# Patient Record
Sex: Male | Born: 1935 | Race: White | Hispanic: No | Marital: Married | Smoking: Former smoker
Health system: Southern US, Community
[De-identification: ages and names within clinical notes are randomized; demographics above are authoritative.]

## PROBLEM LIST (undated history)

## (undated) DIAGNOSIS — I82409 Acute embolism and thrombosis of unspecified deep veins of unspecified lower extremity: Secondary | ICD-10-CM

## (undated) DIAGNOSIS — H269 Unspecified cataract: Secondary | ICD-10-CM

## (undated) DIAGNOSIS — E785 Hyperlipidemia, unspecified: Secondary | ICD-10-CM

## (undated) DIAGNOSIS — M25862 Other specified joint disorders, left knee: Secondary | ICD-10-CM

## (undated) DIAGNOSIS — M1990 Unspecified osteoarthritis, unspecified site: Secondary | ICD-10-CM

## (undated) HISTORY — DX: Hyperlipidemia, unspecified: E78.5

## (undated) HISTORY — PX: APPENDECTOMY: SHX54

## (undated) HISTORY — PX: MOLE REMOVAL: SHX2046

## (undated) HISTORY — PX: TONSILLECTOMY AND ADENOIDECTOMY: SUR1326

## (undated) HISTORY — DX: Unspecified cataract: H26.9

## (undated) HISTORY — PX: TUBAL LIGATION: SHX77

## (undated) HISTORY — PX: JOINT REPLACEMENT: SHX530

## (undated) HISTORY — DX: Acute embolism and thrombosis of unspecified deep veins of unspecified lower extremity: I82.409

## (undated) HISTORY — PX: EYE SURGERY: SHX253

## (undated) HISTORY — PX: TOTAL HIP ARTHROPLASTY: SHX124

## (undated) HISTORY — DX: Unspecified osteoarthritis, unspecified site: M19.90

---

## 2006-01-28 ENCOUNTER — Encounter: Payer: Self-pay | Admitting: Internal Medicine

## 2006-11-22 DIAGNOSIS — M1610 Unilateral primary osteoarthritis, unspecified hip: Secondary | ICD-10-CM | POA: Insufficient documentation

## 2007-07-05 DIAGNOSIS — E7800 Pure hypercholesterolemia, unspecified: Secondary | ICD-10-CM | POA: Insufficient documentation

## 2008-03-05 DIAGNOSIS — H6990 Unspecified Eustachian tube disorder, unspecified ear: Secondary | ICD-10-CM | POA: Insufficient documentation

## 2008-03-05 DIAGNOSIS — H6980 Other specified disorders of Eustachian tube, unspecified ear: Secondary | ICD-10-CM | POA: Insufficient documentation

## 2009-04-01 DIAGNOSIS — H019 Unspecified inflammation of eyelid: Secondary | ICD-10-CM | POA: Insufficient documentation

## 2009-10-28 ENCOUNTER — Ambulatory Visit: Payer: Self-pay | Admitting: Family Medicine

## 2009-10-28 DIAGNOSIS — M545 Low back pain, unspecified: Secondary | ICD-10-CM | POA: Insufficient documentation

## 2009-11-12 DIAGNOSIS — R5383 Other fatigue: Secondary | ICD-10-CM | POA: Insufficient documentation

## 2009-11-12 DIAGNOSIS — R5381 Other malaise: Secondary | ICD-10-CM | POA: Insufficient documentation

## 2009-12-03 ENCOUNTER — Ambulatory Visit: Payer: Self-pay | Admitting: Family Medicine

## 2011-08-17 ENCOUNTER — Ambulatory Visit: Payer: Self-pay | Admitting: Family Medicine

## 2014-01-23 LAB — LIPID PANEL
Cholesterol: 156 mg/dL (ref 0–200)
HDL: 55 mg/dL (ref 35–70)
LDL Cholesterol: 77 mg/dL
LDl/HDL Ratio: 1.4
Triglycerides: 118 mg/dL (ref 40–160)

## 2014-01-23 LAB — HEPATIC FUNCTION PANEL
ALT: 23 U/L (ref 10–40)
AST: 21 U/L (ref 14–40)
Alkaline Phosphatase: 67 U/L (ref 25–125)
Bilirubin, Total: 0.7 mg/dL

## 2014-01-23 LAB — BASIC METABOLIC PANEL
BUN: 19 mg/dL (ref 4–21)
Creatinine: 1.4 mg/dL — AB (ref 0.6–1.3)
Glucose: 104 mg/dL
Potassium: 4.7 mmol/L (ref 3.4–5.3)
Sodium: 141 mmol/L (ref 137–147)

## 2014-01-23 LAB — CBC AND DIFFERENTIAL
HCT: 44 % (ref 41–53)
Hemoglobin: 16 g/dL (ref 13.5–17.5)
Neutrophils Absolute: 6 /uL
Platelets: 229 10*3/uL (ref 150–399)
WBC: 8.8 10^3/mL

## 2014-01-23 LAB — TSH: TSH: 1.95 u[IU]/mL (ref 0.41–5.90)

## 2014-01-23 LAB — PSA: PSA: 5.8

## 2014-04-15 ENCOUNTER — Ambulatory Visit: Payer: Self-pay | Admitting: Family Medicine

## 2014-07-16 ENCOUNTER — Ambulatory Visit: Payer: Self-pay | Admitting: Family Medicine

## 2015-07-28 ENCOUNTER — Ambulatory Visit (INDEPENDENT_AMBULATORY_CARE_PROVIDER_SITE_OTHER): Payer: PPO | Admitting: Family Medicine

## 2015-07-28 ENCOUNTER — Encounter: Payer: Self-pay | Admitting: Family Medicine

## 2015-07-28 VITALS — BP 100/60 | HR 78 | Temp 99.0°F | Resp 16 | Wt 174.4 lb

## 2015-07-28 DIAGNOSIS — E785 Hyperlipidemia, unspecified: Secondary | ICD-10-CM | POA: Insufficient documentation

## 2015-07-28 DIAGNOSIS — E782 Mixed hyperlipidemia: Secondary | ICD-10-CM | POA: Insufficient documentation

## 2015-07-28 DIAGNOSIS — J069 Acute upper respiratory infection, unspecified: Secondary | ICD-10-CM | POA: Diagnosis not present

## 2015-07-28 NOTE — Patient Instructions (Addendum)
Discussed use of Mucinex D and Delsym. Try Claritin for sinus drip.

## 2015-07-28 NOTE — Progress Notes (Signed)
Subjective:     Patient ID: Matthew Clayton, male   DOB: 1926/11/12, 82 y.o.   MRN: [id number]  HPI  Chief Complaint  Patient presents with  . Cough    Patient comes in office today with concerns of dry cough for the past 12hrs. Patient denies shortness of breath or wheezing but states that he has had runny nose associated. Patient has not taken anything over the counter to relieve symptoms.   Wife has been sick as well. No flu shot this season. Denies body aches or fever. Does reports chronic phlegm production.   Review of Systems     Objective:   Physical Exam  Constitutional: He appears well-developed and well-nourished. No distress.  Ears: T.M's intact without inflammation Throat: tonsils absent, minimal erythema Neck: no cervical adenopathy Lungs: clear    Assessment:    1. Upper respiratory infection    Plan:    Discussed otc treatment

## 2015-08-04 ENCOUNTER — Ambulatory Visit (INDEPENDENT_AMBULATORY_CARE_PROVIDER_SITE_OTHER): Payer: PPO | Admitting: Family Medicine

## 2015-08-04 ENCOUNTER — Encounter: Payer: Self-pay | Admitting: Family Medicine

## 2015-08-04 VITALS — BP 112/62 | HR 62 | Temp 98.3°F | Resp 16

## 2015-08-04 DIAGNOSIS — K219 Gastro-esophageal reflux disease without esophagitis: Secondary | ICD-10-CM | POA: Insufficient documentation

## 2015-08-04 DIAGNOSIS — D649 Anemia, unspecified: Secondary | ICD-10-CM | POA: Insufficient documentation

## 2015-08-04 DIAGNOSIS — R059 Cough, unspecified: Secondary | ICD-10-CM

## 2015-08-04 DIAGNOSIS — J410 Simple chronic bronchitis: Secondary | ICD-10-CM

## 2015-08-04 DIAGNOSIS — R05 Cough: Secondary | ICD-10-CM | POA: Diagnosis not present

## 2015-08-04 DIAGNOSIS — J3089 Other allergic rhinitis: Secondary | ICD-10-CM

## 2015-08-04 DIAGNOSIS — I10 Essential (primary) hypertension: Secondary | ICD-10-CM | POA: Insufficient documentation

## 2015-08-04 DIAGNOSIS — J309 Allergic rhinitis, unspecified: Secondary | ICD-10-CM | POA: Insufficient documentation

## 2015-08-04 MED ORDER — DOXYCYCLINE HYCLATE 100 MG PO TABS: 100.0000 mg | ORAL_TABLET | Freq: Two times a day (BID) | ORAL | Status: DC

## 2015-08-04 NOTE — Progress Notes (Signed)
Patient ID: Matthew Clayton, male   DOB: [birth year], 82 y.o.   MRN: [id number]    Subjective:  HPI  Patient saw [name] last week March 13th and was diagnosed with upper respiratory infection and was advised to take Robitussin and Mucinex which patient has done. Patient is still having a cough mainly dry, some runny nose. No other symptoms. Cough does not keep him up at night. Cough is constant even when he is trying to talk. He never had to use an inhaler or take allergy medications. Cough mildly productive. No fever or chills or myalgias. No shortness of breath. Prior to Admission medications   Medication Sig Start Date End Date Taking? Authorizing Provider  simvastatin (ZOCOR) 40 MG tablet  07/23/15  Yes Historical Provider, MD    Patient Active Problem List   Diagnosis Date Noted  . Anemia 08/04/2015  . Essential hypertension 08/04/2015  . Allergic rhinitis 08/04/2015  . Acid reflux 08/04/2015  . Hyperlipidemia 07/28/2015    Past Medical History  Diagnosis Date  . Hyperlipidemia     Social History   Social History  . Marital Status: Married    Spouse Name: N/A  . Number of Children: N/A  . Years of Education: N/A   Occupational History  . Not on file.   Social History Main Topics  . Smoking status: Former Smoker -- 1.50 packs/day for 20 years    Types: Cigarettes    Quit date: 05/17/1958  . Smokeless tobacco: Never Used     Comment: quit in 1968  . Alcohol Use: No  . Drug Use: No  . Sexual Activity: Not on file   Other Topics Concern  . Not on file   Social History Narrative    No Known Allergies  Review of Systems  Constitutional: Negative.   HENT: Negative.   Eyes: Negative.   Respiratory: Positive for cough.   Cardiovascular: Negative.   Gastrointestinal: Negative.   Musculoskeletal: Negative.   Skin: Negative.   Psychiatric/Behavioral: Negative.     Immunization History  Administered Date(s) Administered  . Td 09/15/2012  . Zoster 05/23/2009    Objective:  BP 112/62 mmHg  Pulse 62  Temp(Src) 98.3 F (36.8 C)  Resp 16  SpO2 97%  Physical Exam  Constitutional: He is oriented to person, place, and time and well-developed, well-nourished, and in no distress.  HENT:  Head: Normocephalic and atraumatic.  Right Ear: External ear normal.  Left Ear: External ear normal.  Nose: Nose normal.  Mouth/Throat: Oropharynx is clear and moist.  Eyes: Conjunctivae are normal.  Neck: Neck supple.  Cardiovascular: Normal rate, regular rhythm and normal heart sounds.   Pulmonary/Chest: Effort normal and breath sounds normal. No respiratory distress.  Abdominal: Soft.  Lymphadenopathy:    He has no cervical adenopathy.  Neurological: He is alert and oriented to person, place, and time.  Skin: Skin is warm and dry.  Psychiatric: Mood, memory, affect and judgment normal.    Lab Results  Component Value Date   WBC 8.8 01/23/2014   HGB 16.0 01/23/2014   HCT 44 01/23/2014   PLT 229 01/23/2014   CHOL 156 01/23/2014   TRIG 118 01/23/2014   HDL 55 01/23/2014   LDLCALC 77 01/23/2014   TSH 1.95 01/23/2014   PSA 5.8 01/23/2014    CMP     Component Value Date/Time   NA 141 01/23/2014   K 4.7 01/23/2014   BUN 19 01/23/2014   CREATININE 1.4* 01/23/2014   AST 21  01/23/2014   ALT 23 01/23/2014   ALKPHOS 67 01/23/2014    Assessment and Plan :  1. Simple chronic bronchitis (HCC) Doxycycline for one week.  2. Cough Fluids, Robitussin-DM. I have done the exam and reviewed the above chart and it is accurate to the best of my knowledge.  '  Miguel Aschoff MD [facility] 08/04/2015 2:36 PM

## 2015-11-10 ENCOUNTER — Other Ambulatory Visit: Payer: Self-pay | Admitting: Family Medicine

## 2015-11-10 NOTE — Telephone Encounter (Signed)
Please review, it looks like he sees [name] a lot for acutes and sees [place] only for CPEs, could not tell whose patient this is for sure, -aa

## 2015-11-10 NOTE — Telephone Encounter (Signed)
Pt is requesting refill on simvastatin (ZOCOR) 40 MG tablet. Be sent into [facility] [address]. He would like a 90 day supply

## 2015-11-14 MED ORDER — SIMVASTATIN 40 MG PO TABS: 40.0000 mg | ORAL_TABLET | Freq: Every day | ORAL | Status: DC

## 2015-11-14 NOTE — Telephone Encounter (Signed)
Ok to rf ,--has appt 11/25/15.

## 2015-11-14 NOTE — Telephone Encounter (Signed)
Done-aa 

## 2015-11-25 ENCOUNTER — Encounter: Payer: PPO | Admitting: Family Medicine

## 2015-11-25 ENCOUNTER — Ambulatory Visit: Payer: Self-pay | Admitting: Family Medicine

## 2015-11-25 ENCOUNTER — Encounter: Payer: Self-pay | Admitting: Family Medicine

## 2015-11-25 ENCOUNTER — Ambulatory Visit (INDEPENDENT_AMBULATORY_CARE_PROVIDER_SITE_OTHER): Payer: PPO | Admitting: Family Medicine

## 2015-11-25 VITALS — BP 90/62 | HR 88 | Temp 98.7°F | Resp 20 | Ht 68.0 in | Wt 173.0 lb

## 2015-11-25 DIAGNOSIS — Z23 Encounter for immunization: Secondary | ICD-10-CM

## 2015-11-25 DIAGNOSIS — R413 Other amnesia: Secondary | ICD-10-CM | POA: Insufficient documentation

## 2015-11-25 DIAGNOSIS — H109 Unspecified conjunctivitis: Secondary | ICD-10-CM

## 2015-11-25 DIAGNOSIS — K602 Anal fissure, unspecified: Secondary | ICD-10-CM | POA: Insufficient documentation

## 2015-11-25 DIAGNOSIS — H9190 Unspecified hearing loss, unspecified ear: Secondary | ICD-10-CM | POA: Insufficient documentation

## 2015-11-25 DIAGNOSIS — R9389 Abnormal findings on diagnostic imaging of other specified body structures: Secondary | ICD-10-CM | POA: Insufficient documentation

## 2015-11-25 DIAGNOSIS — M7050 Other bursitis of knee, unspecified knee: Secondary | ICD-10-CM | POA: Insufficient documentation

## 2015-11-25 MED ORDER — SULFACETAMIDE SODIUM 10 % OP SOLN: 2.0000 [drp] | Freq: Four times a day (QID) | OPHTHALMIC | Status: DC

## 2015-11-25 NOTE — Patient Instructions (Signed)
Discussed frequent use of warm compresses 

## 2015-11-25 NOTE — Progress Notes (Signed)
Subjective:     Patient ID: Matthew Clayton, male   DOB: 1926/06/30, 82 y.o.   MRN: [id number]  HPI  Chief Complaint  Patient presents with  . Eye Problem    right eye red x's 3 days.   States he returned from the beach and felt his right eye was red and irritated. No change in vision. Also is going to [place] in November and wishes Hepatitis A and Typhoid vaccines.   Review of Systems     Objective:   Physical Exam  Constitutional: He appears well-developed and well-nourished. No distress.  HENT:  Pupils equal and reactive. Right sclera is injected with no f.b.visualized. [email or web address] to # of fingers. ? Early sty formation in mid lower eyelid.       Assessment:    1. Conjunctivitis of right eye - sulfacetamide (BLEPH-10) 10 % ophthalmic solution; Place 2 drops into the right eye 4 (four) times daily.  Dispense: 15 mL; Refill: 0  2. Need for hepatitis A vaccination - Hepatitis A vaccine adult IM    Plan:    Discussed frequent use of warm compresses. RX for Typhoid vaccine 0.5 ml. IM provided.

## 2015-11-27 ENCOUNTER — Telehealth: Payer: Self-pay | Admitting: Family Medicine

## 2015-11-27 ENCOUNTER — Other Ambulatory Visit: Payer: Self-pay | Admitting: Family Medicine

## 2015-11-27 DIAGNOSIS — Z789 Other specified health status: Secondary | ICD-10-CM

## 2015-11-27 MED ORDER — TYPHOID VACCINE PO CPDR: 1.0000 | DELAYED_RELEASE_CAPSULE | ORAL | Status: DC

## 2015-11-27 NOTE — Telephone Encounter (Signed)
This okay? Matthew Clayton, CMA

## 2015-11-27 NOTE — Telephone Encounter (Signed)
Pt is asking if he can take Typhoid pills instead of the vaccine.  Pt states he can not find the vaccine.  [id number]

## 2015-11-27 NOTE — Telephone Encounter (Signed)
Informed pt's wife. Renaldo Fiddler, CMA

## 2015-11-27 NOTE — Telephone Encounter (Signed)
[facility] It will probably not be covered by your insurance.

## 2015-12-02 ENCOUNTER — Encounter: Payer: Self-pay | Admitting: Family Medicine

## 2015-12-02 ENCOUNTER — Ambulatory Visit (INDEPENDENT_AMBULATORY_CARE_PROVIDER_SITE_OTHER): Payer: PPO | Admitting: Family Medicine

## 2015-12-02 VITALS — BP 118/78 | HR 72 | Temp 97.8°F | Resp 16 | Ht 67.0 in | Wt 169.0 lb

## 2015-12-02 DIAGNOSIS — Z0000 Encounter for general adult medical examination without abnormal findings: Secondary | ICD-10-CM | POA: Diagnosis not present

## 2015-12-02 DIAGNOSIS — R5383 Other fatigue: Secondary | ICD-10-CM | POA: Insufficient documentation

## 2015-12-02 DIAGNOSIS — M791 Myalgia, unspecified site: Secondary | ICD-10-CM | POA: Insufficient documentation

## 2015-12-02 DIAGNOSIS — E785 Hyperlipidemia, unspecified: Secondary | ICD-10-CM | POA: Diagnosis not present

## 2015-12-02 NOTE — Progress Notes (Signed)
Patient: Matthew Clayton, Male    DOB: January 18, 1927, 82 y.o.   MRN: [id number] Visit Date: 12/02/2015  Today's Provider: [name], MD   No chief complaint on file.  Subjective:    Annual wellness visit Matthew Clayton is a 82 y.o. male. He feels well. He reports exercising while doing "a lot" of yard work. He reports he is sleeping well.  Last Colonoscopy- 05/26/2010- small internal hemorrhoids. Diverticulosis. 1 polyp. Repeat 3 years. Due for PCV 13; too soon as pt just had Hep A immunization on 11/25/2015. -----------------------------------------------------------   Review of Systems  Constitutional: Positive for fatigue.  HENT: Positive for hearing loss and tinnitus.   Eyes: Positive for redness.  Musculoskeletal: Positive for myalgias and arthralgias.  Neurological: Positive for weakness.  All other systems reviewed and are negative.   Social History   Social History  . Marital Status: Married    Spouse Name: N/A  . Number of Children: N/A  . Years of Education: N/A   Occupational History  . Not on file.   Social History Main Topics  . Smoking status: Former Smoker -- 1.50 packs/day for 20 years    Types: Cigarettes    Quit date: 05/17/1958  . Smokeless tobacco: Never Used     Comment: quit in 1968  . Alcohol Use: No  . Drug Use: No  . Sexual Activity: Not on file   Other Topics Concern  . Not on file   Social History Narrative    Past Medical History  Diagnosis Date  . Hyperlipidemia      Patient Active Problem List   Diagnosis Date Noted  . Abnormal chest x-ray 11/25/2015  . Anal fissure 11/25/2015  . Decrease in the ability to hear 11/25/2015  . Clergyman's knee 11/25/2015  . Memory loss 11/25/2015  . Anemia 08/04/2015  . Essential hypertension 08/04/2015  . Allergic rhinitis 08/04/2015  . Acid reflux 08/04/2015  . Hyperlipidemia 07/28/2015  . Malaise and fatigue 11/12/2009  . LBP (low back pain) 10/28/2009  . Inflammation  of eyelid 04/01/2009  . Dysfunction of eustachian tube 03/05/2008  . Pure hypercholesterolemia 07/05/2007  . Arthropathy of pelvic region and thigh 11/22/2006    Past Surgical History  Procedure Laterality Date  . Appendectomy    . Total hip arthroplasty Bilateral   . Tonsillectomy and adenoidectomy    . Mole removal      His family history includes Aneurysm in his sister; Breast cancer in his mother, sister, and sister; Heart disease in his father; Stroke in his brother.    No outpatient prescriptions have been marked as taking for the 12/02/15 encounter (Appointment) with Matthew Clayton., MD.    Patient Care Team: Matthew Ginsberg, PA as PCP - General (Family Medicine)    Objective:   Vitals: There were no vitals taken for this visit.  Physical Exam  Constitutional: He is oriented to person, place, and time. He appears well-developed and well-nourished.  HENT:  Head: Normocephalic and atraumatic.  Right Ear: External ear normal.  Left Ear: External ear normal.  Nose: Nose normal.  Mouth/Throat: Oropharynx is clear and moist.  Eyes: Conjunctivae are normal.  Neck: Neck supple. No thyromegaly present.  Cardiovascular: Normal rate, regular rhythm, normal heart sounds and intact distal pulses.   Pulmonary/Chest: Effort normal and breath sounds normal.  Abdominal: Soft.  Genitourinary:  Patient declines DRE  Musculoskeletal: He exhibits edema.  Arthritic changes of hands. MCP of both  thumbs essentially deteriorated  Neurological: He is alert and oriented to person, place, and time.  Skin: Skin is warm and dry.  Psychiatric: He has a normal mood and affect. His behavior is normal. Judgment and thought content normal.    Activities of Daily Living In your present state of health, do you have any difficulty performing the following activities: 08/04/2015  Hearing? N  Vision? N  Difficulty concentrating or making decisions? N  Walking or climbing stairs? N  Dressing  or bathing? N  Doing errands, shopping? N    Fall Risk Assessment Fall Risk  08/04/2015  Falls in the past year? No     Depression Screen PHQ 2/9 Scores 08/04/2015  PHQ - 2 Score 0    Cognitive Testing - 6-CIT  Correct? Score   What year is it? yes 0 0 or 4  What month is it? yes 0 0 or 3  Memorize:    [name],  44,  [address]  [name],      What time is it? (within 1 hour) yes 0 0 or 3  Count backwards from 20 yes 0 0, 2, or 4  Name the months of the year yes 0 0, 2, or 4  Repeat name & address above yes 0 0, 2, 4, 6, 8, or 10       TOTAL SCORE  0 /28   Interpretation:  Normal  Normal (0-7) Abnormal (8-28)       Assessment & Plan:     Annual Wellness Visit  Reviewed patient's Family Medical History Reviewed and updated list of patient's medical providers Assessment of cognitive impairment was done Assessed patient's functional ability Established a written schedule for health screening [name] Completed and Reviewed  Exercise Activities and Dietary recommendations Goals    None      Immunization History  Administered Date(s) Administered  . Hepatitis A, Adult 11/25/2015  . Td 09/15/2012  . Zoster 05/23/2009    Health Maintenance  Topic Date Due  . PNA vac Low Risk Adult (1 of 2 - PCV13) 12/12/1993  . INFLUENZA VACCINE  04/05/2016 (Originally 12/16/2015)  . TETANUS/TDAP  09/16/2022  . ZOSTAVAX  Completed      Discussed health benefits of physical activity, and encouraged him to engage in regular exercise appropriate for his age and condition.    Osteoarthritis Hyperlipidemia Mild cognitive impairment/probable age-appropriate Arrange for labs today. Travel related visit Patient is going to [place] in [place] next month. Hepatitis A is recommended are probably appropriate for going to [place] ------------------------------------------------------------------------------------------------------------  Patient seen and  examined by Matthew Aschoff, MD, and note scribed by Matthew Clayton, CMA.   Matthew Clayton Mon, MD  Matthew Clayton

## 2015-12-03 LAB — COMPREHENSIVE METABOLIC PANEL
ALT: 14 IU/L (ref 0–44)
AST: 13 IU/L (ref 0–40)
Albumin/Globulin Ratio: 1.5 (ref 1.2–2.2)
Albumin: 4.2 g/dL (ref 3.5–4.7)
Alkaline Phosphatase: 72 IU/L (ref 39–117)
BUN/Creatinine Ratio: 16 (ref 10–24)
BUN: 19 mg/dL (ref 8–27)
Bilirubin Total: 0.4 mg/dL (ref 0.0–1.2)
CO2: 22 mmol/L (ref 18–29)
Calcium: 9.8 mg/dL (ref 8.6–10.2)
Chloride: 105 mmol/L (ref 96–106)
Creatinine, Ser: 1.18 mg/dL (ref 0.76–1.27)
GFR calc Af Amer: 64 mL/min/{1.73_m2} (ref >59)
GFR calc non Af Amer: 56 mL/min/{1.73_m2} — ABNORMAL LOW (ref >59)
Globulin, Total: 2.8 g/dL (ref 1.5–4.5)
Glucose: 98 mg/dL (ref 65–99)
Potassium: 4.4 mmol/L (ref 3.5–5.2)
Sodium: 141 mmol/L (ref 134–144)
Total Protein: 7 g/dL (ref 6.0–8.5)

## 2015-12-03 LAB — CBC WITH DIFFERENTIAL/PLATELET
Basophils Absolute: 0 10*3/uL (ref 0.0–0.2)
Basos: 1 %
EOS (ABSOLUTE): 0.3 10*3/uL (ref 0.0–0.4)
Eos: 4 %
Hematocrit: 42.6 % (ref 37.5–51.0)
Hemoglobin: 14.4 g/dL (ref 12.6–17.7)
Immature Grans (Abs): 0 10*3/uL (ref 0.0–0.1)
Immature Granulocytes: 0 %
Lymphocytes Absolute: 1.8 10*3/uL (ref 0.7–3.1)
Lymphs: 24 %
MCH: 32.3 pg (ref 26.6–33.0)
MCHC: 33.8 g/dL (ref 31.5–35.7)
MCV: 96 fL (ref 79–97)
Monocytes Absolute: 0.6 10*3/uL (ref 0.1–0.9)
Monocytes: 7 %
Neutrophils Absolute: 4.8 10*3/uL (ref 1.4–7.0)
Neutrophils: 64 %
Platelets: 288 10*3/uL (ref 150–379)
RBC: 4.46 x10E6/uL (ref 4.14–5.80)
RDW: 13.1 % (ref 12.3–15.4)
WBC: 7.6 10*3/uL (ref 3.4–10.8)

## 2015-12-03 LAB — LIPID PANEL
Chol/HDL Ratio: 3.2 ratio units (ref 0.0–5.0)
Cholesterol, Total: 135 mg/dL (ref 100–199)
HDL: 42 mg/dL (ref >39)
LDL Calculated: 75 mg/dL (ref 0–99)
Triglycerides: 90 mg/dL (ref 0–149)
VLDL Cholesterol Cal: 18 mg/dL (ref 5–40)

## 2015-12-03 LAB — CK: Total CK: 88 U/L (ref 24–204)

## 2015-12-03 LAB — TSH: TSH: 1.06 u[IU]/mL (ref 0.450–4.500)

## 2015-12-04 NOTE — Telephone Encounter (Signed)
Pt returned call regarding recent lab work. Was advised. Renaldo Fiddler, CMA

## 2015-12-04 NOTE — Telephone Encounter (Signed)
Pt is returning call.  CB#[phone]/MW °

## 2015-12-08 ENCOUNTER — Encounter: Payer: Self-pay | Admitting: Family Medicine

## 2015-12-08 ENCOUNTER — Ambulatory Visit (INDEPENDENT_AMBULATORY_CARE_PROVIDER_SITE_OTHER): Payer: PPO | Admitting: Family Medicine

## 2015-12-08 VITALS — BP 106/62 | HR 64 | Temp 97.7°F | Wt 170.0 lb

## 2015-12-08 DIAGNOSIS — M10072 Idiopathic gout, left ankle and foot: Secondary | ICD-10-CM | POA: Diagnosis not present

## 2015-12-08 DIAGNOSIS — M109 Gout, unspecified: Secondary | ICD-10-CM

## 2015-12-08 NOTE — Patient Instructions (Signed)
Continue Ibuprofen 400 mg. 3 x day with food until swelling abates. Consider getting a uric acid level if recurs. Gout Gout is an inflammatory arthritis caused by a buildup of uric acid crystals in the joints. Uric acid is a chemical that is normally present in the blood. When the level of uric acid in the blood is too high it can form crystals that deposit in your joints and tissues. This causes joint redness, soreness, and swelling (inflammation). Repeat attacks are common. Over time, uric acid crystals can form into masses (tophi) near a joint, destroying bone and causing disfigurement. Gout is treatable and often preventable. CAUSES  The disease begins with elevated levels of uric acid in the blood. Uric acid is produced by your body when it breaks down a naturally found substance called purines. Certain foods you eat, such as meats and fish, contain high amounts of purines. Causes of an elevated uric acid level include:  Being passed down from parent to child (heredity).  Diseases that cause increased uric acid production (such as obesity, psoriasis, and certain cancers).  Excessive alcohol use.  Diet, especially diets rich in meat and seafood.  Medicines, including certain cancer-fighting medicines (chemotherapy), water pills (diuretics), and aspirin.  Chronic kidney disease. The kidneys are no longer able to remove uric acid well.  Problems with metabolism. Conditions strongly associated with gout include:  Obesity.  High blood pressure.  High cholesterol.  Diabetes. Not everyone with elevated uric acid levels gets gout. It is not understood why some people get gout and others do not. Surgery, joint injury, and eating too much of certain foods are some of the factors that can lead to gout attacks. SYMPTOMS   An attack of gout comes on quickly. It causes intense pain with redness, swelling, and warmth in a joint.  Fever can occur.  Often, only one joint is involved. Certain  joints are more commonly involved:  Base of the big toe.  Knee.  Ankle.  Wrist.  Finger. Without treatment, an attack usually goes away in a few days to weeks. Between attacks, you usually will not have symptoms, which is different from many other forms of arthritis. DIAGNOSIS  Your caregiver will suspect gout based on your symptoms and exam. In some cases, tests may be recommended. The tests may include:  Blood tests.  Urine tests.  X-rays.  Joint fluid exam. This exam requires a needle to remove fluid from the joint (arthrocentesis). Using a microscope, gout is confirmed when uric acid crystals are seen in the joint fluid. TREATMENT  There are two phases to gout treatment: treating the sudden onset (acute) attack and preventing attacks (prophylaxis).  Treatment of an Acute Attack.  Medicines are used. These include anti-inflammatory medicines or steroid medicines.  An injection of steroid medicine into the affected joint is sometimes necessary.  The painful joint is rested. Movement can worsen the arthritis.  You may use warm or cold treatments on painful joints, depending which works best for you.  Treatment to Prevent Attacks.  If you suffer from frequent gout attacks, your caregiver may advise preventive medicine. These medicines are started after the acute attack subsides. These medicines either help your kidneys eliminate uric acid from your body or decrease your uric acid production. You may need to stay on these medicines for a very long time.  The early phase of treatment with preventive medicine can be associated with an increase in acute gout attacks. For this reason, during the first few months  of treatment, your caregiver may also advise you to take medicines usually used for acute gout treatment. Be sure you understand your caregiver's directions. Your caregiver may make several adjustments to your medicine dose before these medicines are effective.  Discuss  dietary treatment with your caregiver or dietitian. Alcohol and drinks high in sugar and fructose and foods such as meat, poultry, and seafood can increase uric acid levels. Your caregiver or dietitian can advise you on drinks and foods that should be limited. HOME CARE INSTRUCTIONS   Do not take aspirin to relieve pain. This raises uric acid levels.  Only take over-the-counter or prescription medicines for pain, discomfort, or fever as directed by your caregiver.  Rest the joint as much as possible. When in bed, keep sheets and blankets off painful areas.  Keep the affected joint raised (elevated).  Apply warm or cold treatments to painful joints. Use of warm or cold treatments depends on which works best for you.  Use crutches if the painful joint is in your leg.  Drink enough fluids to keep your urine clear or pale yellow. This helps your body get rid of uric acid. Limit alcohol, sugary drinks, and fructose drinks.  Follow your dietary instructions. Pay careful attention to the amount of protein you eat. Your daily diet should emphasize fruits, vegetables, whole grains, and fat-free or low-fat milk products. Discuss the use of coffee, vitamin C, and cherries with your caregiver or dietitian. These may be helpful in lowering uric acid levels.  Maintain a healthy body weight. SEEK MEDICAL CARE IF:   You develop diarrhea, vomiting, or any side effects from medicines.  You do not feel better in 24 hours, or you are getting worse. SEEK IMMEDIATE MEDICAL CARE IF:   Your joint becomes suddenly more tender, and you have chills or a fever. MAKE SURE YOU:   Understand these instructions.  Will watch your condition.  Will get help right away if you are not doing well or get worse.   This information is not intended to replace advice given to you by your health care provider. Make sure you discuss any questions you have with your health care provider.   Document Released: 04/30/2000  Document Revised: 05/24/2014 Document Reviewed: 12/15/2011 Elsevier Interactive Patient Education [employer].

## 2015-12-08 NOTE — Progress Notes (Signed)
Subjective:     Patient ID: Matthew Clayton, male   DOB: [birth year], 82 y.o.   MRN: [id number]  HPI  Chief Complaint  Patient presents with  . Toe Pain  States his left toe became red, warm, swollen and painful on 7/21. Since then it has improved as he has applied cool compresses and took two Aleve last night. Denies injury or history of gout. Recent labs demonstrated GFR 56.   Review of Systems     Objective:   Physical Exam  Constitutional: He appears well-developed and well-nourished. No distress.  Cardiovascular:  Pulses:      Dorsalis pedis pulses are 2+ on the left side.       Posterior tibial pulses are 2+ on the left side.  Skin:  Left first MTP joint mildly tender, warm, and swollen.       Assessment:    1. 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 Obtain labs for recurrence.

## 2016-01-26 ENCOUNTER — Encounter: Payer: Self-pay | Admitting: Family Medicine

## 2016-01-26 ENCOUNTER — Ambulatory Visit (INDEPENDENT_AMBULATORY_CARE_PROVIDER_SITE_OTHER): Payer: PPO | Admitting: Family Medicine

## 2016-01-26 VITALS — BP 108/60 | HR 60 | Temp 97.6°F | Resp 16 | Wt 172.2 lb

## 2016-01-26 DIAGNOSIS — S51812A Laceration without foreign body of left forearm, initial encounter: Secondary | ICD-10-CM | POA: Diagnosis not present

## 2016-01-26 MED ORDER — SODIUM CHLORIDE 0.9 % EX SOLN: 2.0000 "application " | Freq: Two times a day (BID) | CUTANEOUS | 0 refills | Status: DC

## 2016-01-26 NOTE — Patient Instructions (Signed)
Cleanse your forearm with salt water solution twice daily. and apply dressing. You may remove the skin after it becomes brittle

## 2016-01-26 NOTE — Progress Notes (Signed)
Subjective:     Patient ID: Matthew Clayton, male   DOB: 1926-12-26, 83 y.o.   MRN: [id number]  HPI  Chief Complaint  Patient presents with  . Laceration    Patient comes in office today to address laceration to his left arm. Patient states on Thursday 9/7 he had tripped over a root while cutting tree, patient has been applying neosporin around laceration.   Tetanus UTD.   Review of Systems     Objective:   Physical Exam  Constitutional: He appears well-developed and well-nourished. No distress.  Skin:  Left forearm skin tear without drainage:: cleansed with saline and dressing applied with Coban       Assessment:    1. Skin tear of forearm without complication, left, initial encounter - SODIUM CHLORIDE, EXTERNAL, ([facility] [place]) 0.9 % SOLN; Apply 2 application topically 2 (two) times daily. Cover with dressing  Dispense: 210 mL; Refill: 0    Plan:    Discussed cleansing twice daily with saline and applying dressing. May debride skin when it becomes brittle.

## 2016-02-12 ENCOUNTER — Ambulatory Visit (INDEPENDENT_AMBULATORY_CARE_PROVIDER_SITE_OTHER): Payer: PPO

## 2016-02-12 DIAGNOSIS — Z23 Encounter for immunization: Secondary | ICD-10-CM | POA: Diagnosis not present

## 2016-03-11 ENCOUNTER — Telehealth: Payer: Self-pay | Admitting: Family Medicine

## 2016-03-11 NOTE — Telephone Encounter (Signed)
Pt is requesting to have a shingle shot.  Please advise.  [id number]

## 2016-03-12 NOTE — Telephone Encounter (Signed)
Informed wife that [facility] record shows patient was given vaccine on 05/23/2009.KW

## 2016-04-29 ENCOUNTER — Ambulatory Visit (INDEPENDENT_AMBULATORY_CARE_PROVIDER_SITE_OTHER): Payer: PPO | Admitting: Family Medicine

## 2016-04-29 ENCOUNTER — Encounter: Payer: Self-pay | Admitting: Family Medicine

## 2016-04-29 VITALS — BP 128/64 | HR 76 | Temp 97.5°F | Resp 16 | Ht 67.5 in | Wt 166.6 lb

## 2016-04-29 DIAGNOSIS — B9789 Other viral agents as the cause of diseases classified elsewhere: Secondary | ICD-10-CM

## 2016-04-29 DIAGNOSIS — J069 Acute upper respiratory infection, unspecified: Secondary | ICD-10-CM | POA: Diagnosis not present

## 2016-04-29 DIAGNOSIS — W57XXXA Bitten or stung by nonvenomous insect and other nonvenomous arthropods, initial encounter: Secondary | ICD-10-CM

## 2016-04-29 MED ORDER — AZITHROMYCIN 250 MG PO TABS: ORAL_TABLET | ORAL | 0 refills | Status: DC

## 2016-04-29 MED ORDER — HYDROCODONE-HOMATROPINE 5-1.5 MG/5ML PO SYRP: ORAL_SOLUTION | ORAL | 0 refills | Status: DC

## 2016-04-29 NOTE — Progress Notes (Signed)
Subjective:     Patient ID: Matthew Clayton, male   DOB: [birth year], 82 y.o.   MRN: [id number]  HPI  Chief Complaint  Patient presents with  . URI    Patient reports having a productive cough. He has tried OTC medication with moderate relief   States his symptoms started with sore throat and subsequent sinus congestion and cough two weeks ago while at the end of a vacation to [place]. Reports sinuses have improved with residual clear congestion. Cough is minimally productive. Also has residual itching and mild swelling on his left lower leg from "chigger" bites a few weeks ago.   Review of Systems     Objective:   Physical Exam  Constitutional: He appears well-developed and well-nourished. No distress.  Skin:  Distal left lower extremity with residual 2 mm. Erosions and dry flaking skin. No pitting edema.  Ears: T.M's intact without inflammation Throat: tonsils absent Neck: no cervical adenopathy Lungs: clear     Assessment:    1. Viral upper respiratory tract infection - HYDROcodone-homatropine (HYCODAN) 5-1.5 MG/5ML syrup; 5 ml ever 6 to 8 hours as needed for cough  Dispense: 240 mL; Refill: 0 - azithromycin (ZITHROMAX Z-PAK) 250 MG tablet; Two pills the first day then one pill daily.  Dispense: 6 each; Refill: 0  2. Insect bite, initial encounte    Plan:    Discussed symptomatic treatment of cough for a few more days. If not improving to start abx. Suggested a hydrocortisone/ moisturizing mix for his leg.

## 2016-04-29 NOTE — Patient Instructions (Addendum)
For your insect bites/dry skin on your legs use a small amount of hydrocortisone mixed with a moisturizer cream after bath and one other time daily. Start the antibiotic is cough not improving over the next few days.

## 2016-05-17 ENCOUNTER — Emergency Department: Payer: PPO

## 2016-05-17 ENCOUNTER — Encounter: Payer: Self-pay | Admitting: Emergency Medicine

## 2016-05-17 ENCOUNTER — Emergency Department
Admission: EM | Admit: 2016-05-17 | Discharge: 2016-05-17 | Disposition: A | Discharge status: Home / Self Care | Payer: PPO | Attending: Emergency Medicine | Admitting: Emergency Medicine

## 2016-05-17 DIAGNOSIS — I82402 Acute embolism and thrombosis of unspecified deep veins of left lower extremity: Secondary | ICD-10-CM | POA: Diagnosis not present

## 2016-05-17 DIAGNOSIS — M7989 Other specified soft tissue disorders: Secondary | ICD-10-CM | POA: Diagnosis not present

## 2016-05-17 DIAGNOSIS — R600 Localized edema: Secondary | ICD-10-CM | POA: Diagnosis not present

## 2016-05-17 DIAGNOSIS — R918 Other nonspecific abnormal finding of lung field: Secondary | ICD-10-CM | POA: Insufficient documentation

## 2016-05-17 DIAGNOSIS — Z87891 Personal history of nicotine dependence: Secondary | ICD-10-CM | POA: Diagnosis not present

## 2016-05-17 DIAGNOSIS — Z79899 Other long term (current) drug therapy: Secondary | ICD-10-CM | POA: Insufficient documentation

## 2016-05-17 DIAGNOSIS — I10 Essential (primary) hypertension: Secondary | ICD-10-CM | POA: Diagnosis not present

## 2016-05-17 DIAGNOSIS — I824Z2 Acute embolism and thrombosis of unspecified deep veins of left distal lower extremity: Secondary | ICD-10-CM | POA: Diagnosis not present

## 2016-05-17 DIAGNOSIS — I82412 Acute embolism and thrombosis of left femoral vein: Secondary | ICD-10-CM

## 2016-05-17 LAB — BASIC METABOLIC PANEL
Anion gap: 8 (ref 5–15)
BUN: 14 mg/dL (ref 6–20)
CO2: 25 mmol/L (ref 22–32)
Calcium: 9.4 mg/dL (ref 8.9–10.3)
Chloride: 102 mmol/L (ref 101–111)
Creatinine, Ser: 1.21 mg/dL (ref 0.61–1.24)
GFR calc Af Amer: >60 mL/min (ref >60)
GFR calc non Af Amer: 52 mL/min — ABNORMAL LOW (ref >60)
Glucose, Bld: 112 mg/dL — ABNORMAL HIGH (ref 65–99)
Potassium: 3.9 mmol/L (ref 3.5–5.1)
Sodium: 135 mmol/L (ref 135–145)

## 2016-05-17 LAB — CBC WITH DIFFERENTIAL/PLATELET
Basophils Absolute: 0 K/uL (ref 0–0.1)
Basophils Relative: 0 %
Eosinophils Absolute: 0.2 K/uL (ref 0–0.7)
Eosinophils Relative: 2 %
HCT: 40.4 % (ref 40.0–52.0)
Hemoglobin: 14.1 g/dL (ref 13.0–18.0)
Lymphocytes Relative: 12 %
Lymphs Abs: 1 K/uL (ref 1.0–3.6)
MCH: 32.6 pg (ref 26.0–34.0)
MCHC: 35 g/dL (ref 32.0–36.0)
MCV: 93.2 fL (ref 80.0–100.0)
Monocytes Absolute: 0.8 K/uL (ref 0.2–1.0)
Monocytes Relative: 8 %
Neutro Abs: 7 K/uL — ABNORMAL HIGH (ref 1.4–6.5)
Neutrophils Relative %: 78 %
Platelets: 178 K/uL (ref 150–440)
RBC: 4.34 MIL/uL — ABNORMAL LOW (ref 4.40–5.90)
RDW: 13.5 % (ref 11.5–14.5)
WBC: 9 K/uL (ref 3.8–10.6)

## 2016-05-17 LAB — TROPONIN I: Troponin I: <0.03 ng/mL (ref <0.03)

## 2016-05-17 LAB — BRAIN NATRIURETIC PEPTIDE: B Natriuretic Peptide: 77 pg/mL (ref 0.0–100.0)

## 2016-05-17 MED ORDER — IOPAMIDOL (ISOVUE-370) INJECTION 76%
75.0000 mL | Freq: Once | INTRAVENOUS | Status: AC | PRN
  Administered 2016-05-17: 75 mL via INTRAVENOUS

## 2016-05-17 MED ORDER — SODIUM CHLORIDE 0.9 % IV BOLUS (SEPSIS): 1000.0000 mL | Freq: Once | INTRAVENOUS | Status: DC

## 2016-05-17 MED ORDER — RIVAROXABAN 15 MG PO TABS
15.0000 mg | ORAL_TABLET | Freq: Once | ORAL | Status: AC
Start: 2016-05-17 — End: 2016-05-17
  Administered 2016-05-17: 15 mg via ORAL
  Filled 2016-05-17: qty 1

## 2016-05-17 MED ORDER — ENOXAPARIN SODIUM 80 MG/0.8ML ~~LOC~~ SOLN
1.0000 mg/kg | Freq: Once | SUBCUTANEOUS | Status: DC
  Filled 2016-05-17: qty 0.8

## 2016-05-17 MED ORDER — RIVAROXABAN (XARELTO) VTE STARTER PACK (15 & 20 MG): ORAL_TABLET | ORAL | 0 refills | Status: DC

## 2016-05-17 NOTE — ED Notes (Signed)
Pt with left ankle, calf swollen. Denies pain. Doppler + pulse. Pt able to ambulate independently.

## 2016-05-17 NOTE — ED Provider Notes (Signed)
[facility] Emergency Department Provider Note  ____________________________________________  Time seen: Approximately 12:35 PM  I have reviewed the triage vital signs and the nursing notes.   HISTORY  Chief Complaint Leg Swelling   HPI Matthew Clayton is a 83 y.o. male with a history of gout and hyperlipidemia who presents for evaluation of left lower extremity swelling. Patient reports that several weeks ago he was working cleaning his yard and noticed multiple insect bites to his left lower extremity. Immediately after that he noticed swelling around the left ankle that has been present for several weeks. This morning when patient woke up he noticed that his whole entire left leg from the knee down was swollen and came to the emergency room for evaluation. Patient denies any prior history of leg swelling. He does have a history of gout but usually involves the left first MTP. He has had no pain in his leg, no redness, no fever, no chills. He has no personal or family history of blood clots however patient went on a long flight to and from [place] returning home on 12/6. He tells me the ankle swelling was already present to the trip. He denies chest pain or shortness of breath, denies hemoptysis. He does not take any blood thinners.  Past Medical History:  Diagnosis Date  . Hyperlipidemia     Patient Active Problem List   Diagnosis Date Noted  . Fatigue 12/02/2015  . Myalgia 12/02/2015  . Abnormal chest x-ray 11/25/2015  . Anal fissure 11/25/2015  . Decrease in the ability to hear 11/25/2015  . Clergyman's knee 11/25/2015  . Memory loss 11/25/2015  . Anemia 08/04/2015  . Essential hypertension 08/04/2015  . Allergic rhinitis 08/04/2015  . Acid reflux 08/04/2015  . Hyperlipidemia 07/28/2015  . Malaise and fatigue 11/12/2009  . LBP (low back pain) 10/28/2009  . Inflammation of eyelid 04/01/2009  . Dysfunction of eustachian tube 03/05/2008  . Pure  hypercholesterolemia 07/05/2007  . Arthropathy of pelvic region and thigh 11/22/2006    Past Surgical History:  Procedure Laterality Date  . APPENDECTOMY    . MOLE REMOVAL    . TONSILLECTOMY AND ADENOIDECTOMY    . TOTAL HIP ARTHROPLASTY Bilateral     Prior to Admission medications   Medication Sig Start Date End Date Taking? Authorizing Provider  azithromycin (ZITHROMAX Z-PAK) 250 MG tablet Two pills the first day then one pill daily. 04/29/16   Carmon Ginsberg, PA  HYDROcodone-homatropine [facility]) 5-1.5 MG/5ML syrup 5 ml ever 6 to 8 hours as needed for cough 04/29/16   Carmon Ginsberg, PA  Rivaroxaban 15 & 20 MG TBPK Take as directed on package: Start with one 15mg  tablet by mouth twice a day with food. On Day 22, switch to one 20mg  tablet once a day with food. 05/17/16   Rudene Re, MD  simvastatin (ZOCOR) 40 MG tablet Take 1 tablet (40 mg total) by mouth daily. 11/14/15   Richard Maceo Pro., MD  typhoid (VIVOTIF) DR capsule Take 1 capsule by mouth every other day. Patient not taking: Reported on 04/29/2016 11/27/15   Carmon Ginsberg, PA    Allergies Patient has no known allergies.  Family History  Problem Relation Age of Onset  . Breast cancer Mother   . Heart disease Father   . Breast cancer Sister   . Stroke Brother   . Breast cancer Sister   . Aneurysm Sister     Social History Social History  Substance Use Topics  .  Smoking status: Former Smoker    Packs/day: 1.50    Years: 20.00    Types: Cigarettes    Quit date: 05/17/1958  . Smokeless tobacco: Never Used     Comment: quit in 1968  . Alcohol use No    Review of Systems  Constitutional: Negative for fever. Eyes: Negative for visual changes. ENT: Negative for sore throat. Neck: No neck pain  Cardiovascular: Negative for chest pain. Respiratory: Negative for shortness of breath. Gastrointestinal: Negative for abdominal pain, vomiting or diarrhea. Genitourinary: Negative for dysuria. Musculoskeletal:  Negative for back pain. + LLE swelling  Skin: Negative for rash. Neurological: Negative for headaches, weakness or numbness. Psych: No SI or HI  ____________________________________________   PHYSICAL EXAM:  VITAL SIGNS: ED Triage Vitals  Enc Vitals Group     BP 05/17/16 1207 (!) 149/79     Pulse Rate 05/17/16 1207 63     Resp 05/17/16 1207 18     Temp 05/17/16 1207 97.4 F (36.3 C)     Temp src --      SpO2 05/17/16 1207 99 %     Weight 05/17/16 1208 166 lb (75.3 kg)     Height 05/17/16 1208 5\' 7"  (1.702 m)     Head Circumference --      Peak Flow --      Pain Score 05/17/16 1208 0     Pain Loc --      Pain Edu? --      Excl. in [place]? --     Constitutional: Alert and oriented. Well appearing and in no apparent distress. HEENT:      Head: Normocephalic and atraumatic.         Eyes: Conjunctivae are normal. Sclera is non-icteric. EOMI. PERRL      Mouth/Throat: Mucous membranes are moist.       Neck: Supple with no signs of meningismus. Cardiovascular: Regular rate and rhythm. No murmurs, gallops, or rubs. 2+ symmetrical distal pulses are present in all extremities. No JVD. Respiratory: Normal respiratory effort. Lungs are clear to auscultation bilaterally. No wheezes, crackles, or rhonchi.  Gastrointestinal: Soft, non tender, and non distended with positive bowel sounds. No rebound or guarding. Genitourinary: No CVA tenderness. Musculoskeletal: Asymmetric swelling of the LLE from the knee down with significant swelling of the left ankle. Trace pitting edema on LLE, no erythema, warmth, or ttp. No swelling of the 1st MTP. Neurologic: Normal speech and language. Face is symmetric. Moving all extremities. No gross focal neurologic deficits are appreciated. Skin: Skin is warm, dry and intact. No rash noted. Psychiatric: Mood and affect are normal. Speech and behavior are normal.  ____________________________________________   LABS (all labs ordered are listed, but only  abnormal results are displayed)  Labs Reviewed  CBC WITH DIFFERENTIAL/PLATELET - Abnormal; Notable for the following:       Result Value   RBC 4.34 (*)    Neutro Abs 7.0 (*)    All other components within normal limits  BASIC METABOLIC PANEL - Abnormal; Notable for the following:    Glucose, Bld 112 (*)    GFR calc non Af Amer 52 (*)    All other components within normal limits  TROPONIN I  BRAIN NATRIURETIC PEPTIDE   ____________________________________________  EKG  ED ECG REPORT I, Rudene Re, the attending physician, personally viewed and interpreted this ECG.  Sinus bradycardia, rate of 55, first-degree AV block, normal QRS and QTc intervals, normal axis, no ST elevations or depressions. No prior for  comparison ____________________________________________  RADIOLOGY  US doppler:  Examination is positive for extensive largely occlusive DVT extending from the left common femoral vein through the imaged tibial veins.   CTA chest: Potentially very subtle nonocclusive mural thrombus in right lower lobe pulmonary artery branches. This does not have the appearance of acute pulmonary embolism and may reflect prior PE. Mildly prominent right hilar lymph node tissue identified without obvious pathologic lymphadenopathy. ____________________________________________   PROCEDURES  Procedure(s) performed: None Procedures Critical Care performed:  None ____________________________________________   INITIAL IMPRESSION / ASSESSMENT AND PLAN / ED COURSE  83 y.o. male with a history of gout and hyperlipidemia who presents for evaluation of left lower extremity swelling. The shin with asymmetric swelling of his left lower extremity, trace pitting edema from the knee to the ankle, no warmth or erythema, no fever. Check basic blood work and sent patient for a Doppler to rule out DVT especially in the setting of recent long trip.  Clinical Course as of May 17 1425  Mon May 17, 2016  1419 US doppler showing largely occlusive DVT extending from the left common femoral vein through the imaged tibial veins. CTA with no evidence of acute PE, CXR, troponin and EKG with no acute findigns. Patient remains stable with no CP or SOB, no significant pain on the leg and able to ambulate without difficulty. Patient was started on Xarelto and will f/u closely with his PCP in 1-2 days. I thoroughly explained strict return precautions to patient and his wife to return to the emergency room for any chest pain, shortness of breath, or dizziness. Patient and wife are comfortable with the plan and the discharge instructions.  [CV]    Clinical Course User Index [CV] Rudene Re, MD    Pertinent labs & imaging results that were available during my care of the patient were reviewed by me and considered in my medical decision making (see chart for details).    ____________________________________________   FINAL CLINICAL IMPRESSION(S) / ED DIAGNOSES  Final diagnoses:  Acute deep vein thrombosis (DVT) of femoral vein of left lower extremity (HCC)      NEW MEDICATIONS STARTED DURING THIS VISIT:  New Prescriptions   RIVAROXABAN 15 & 20 MG TBPK    Take as directed on package: Start with one 15mg  tablet by mouth twice a day with food. On Day 22, switch to one 20mg  tablet once a day with food.     Note:  This document was prepared using Dragon voice recognition software and may include unintentional dictation errors.    Rudene Re, MD 05/17/16 [id number]

## 2016-05-17 NOTE — Discharge Instructions (Signed)
Return to the emergency room if he develops chest pain, dizziness, or shortness of breath. Take Xarelto as prescribed. Elevate your leg. follow-up with your primary care doctor in 1-2 days.

## 2016-05-18 ENCOUNTER — Telehealth: Payer: Self-pay | Admitting: Family Medicine

## 2016-05-18 NOTE — Telephone Encounter (Signed)
See below-aa 

## 2016-05-18 NOTE — Telephone Encounter (Signed)
Pt was discharged from [facility] ED 05/17/2016 for swollen left leg.  I have scheduled a hospital follow up appointment/MW

## 2016-05-19 ENCOUNTER — Encounter: Payer: Self-pay | Admitting: Family Medicine

## 2016-05-19 ENCOUNTER — Ambulatory Visit (INDEPENDENT_AMBULATORY_CARE_PROVIDER_SITE_OTHER): Payer: PPO | Admitting: Family Medicine

## 2016-05-19 VITALS — BP 104/60 | HR 56 | Temp 97.8°F | Resp 16 | Wt 168.0 lb

## 2016-05-19 DIAGNOSIS — I82412 Acute embolism and thrombosis of left femoral vein: Secondary | ICD-10-CM

## 2016-05-19 NOTE — Progress Notes (Signed)
Patient: Matthew Clayton Male    DOB: May 02, 1927   83 y.o.   MRN: [id number] Visit Date: 05/19/2016  Today's Provider: [name], MD   Chief Complaint  Patient presents with  . DVT   Subjective:    HPI     Follow up ER visit  Patient was seen in ER for leg swelling on 05/17/2014. He was treated for DVT. Pt was traveling to [place]. Pt returned on April 21, 2016. Treatment for this included Xarelto. He reports excellent compliance with treatment. He reports this condition is Unchanged. Pt still c/o LLE swelling from knee to ankle. Denies pain. Pt is concerned because the swelling is still present.  ------------------------------------------------------------------------------------    No Known Allergies   Current Outpatient Prescriptions:  .  HYDROcodone-homatropine (HYCODAN) 5-1.5 MG/5ML syrup, 5 ml ever 6 to 8 hours as needed for cough, Disp: 240 mL, Rfl: 0 .  Rivaroxaban 15 & 20 MG TBPK, Take as directed on package: Start with one 15mg  tablet by mouth twice a day with food. On Day 22, switch to one 20mg  tablet once a day with food., Disp: 51 each, Rfl: 0 .  simvastatin (ZOCOR) 40 MG tablet, Take 1 tablet (40 mg total) by mouth daily., Disp: 90 tablet, Rfl: 3 .  typhoid (VIVOTIF) DR capsule, Take 1 capsule by mouth every other day. (Patient not taking: Reported on 05/19/2016), Disp: 4 capsule, Rfl: 0  Review of Systems  Constitutional: Negative for activity change, appetite change, chills, diaphoresis, fatigue, fever and unexpected weight change.  Respiratory: Positive for cough (improved with Hycodan). Negative for shortness of breath.   Cardiovascular: Positive for leg swelling. Negative for chest pain and palpitations.    Social History  Substance Use Topics  . Smoking status: Former Smoker    Packs/day: 1.50    Years: 20.00    Types: Cigarettes    Quit date: 05/16/1960  . Smokeless tobacco: Never Used     Comment: quit in 1968  . Alcohol use No    Objective:   BP 104/60 (BP Location: Right Arm, Patient Position: Sitting, Cuff Size: Normal)   Pulse (!) 56   Temp 97.8 F (36.6 C) (Oral)   Resp 16   Wt 168 lb (76.2 kg)   BMI 26.31 kg/m   Physical Exam  Constitutional: He appears well-developed and well-nourished.  Cardiovascular: Normal rate, regular rhythm and normal heart sounds.   Pulmonary/Chest: Effort normal and breath sounds normal. No respiratory distress. He has no wheezes. He has no rales.  Musculoskeletal: He exhibits edema (diffuse swelling of left leg).  Psychiatric: He has a normal mood and affect. His behavior is normal.        Assessment & Plan:     1. Acute deep vein thrombosis (DVT) of femoral vein of left lower extremity (HCC) Stable. Advised pt to continue Xarelto, wear compression stockings, ambulate frequently while driving/flying. Referred to vascular surgery for further evaluation and pt education. Will FU in 1 month.6 months of anticoagulation discussed with patient. I do not think he needs a coagulopathy workup. - Ambulatory referral to Vascular Surgery     Patient seen and examined by Miguel Aschoff, MD, and note scribed by Renaldo Fiddler, CMA. I have done the exam and reviewed the above chart and it is accurate to the best of my knowledge. Development worker, community has been used in this note in any air is in the dictation or transcription are unintentional.  Miguel Aschoff  Brooke Bonito, [facility]

## 2016-05-19 NOTE — Patient Instructions (Addendum)
Continue the Xarelto that the emergency room prescribed for 6 months. While on the blood thinner, you will be at risk for heavy bleeding. Avoid climbing. The swelling will stay present for several months. Wear thigh high support hose. While flying or driving, get up from seat and walk around every hour. Elevate foot for the next week. Can continue normal activities, except for activities that increase risk of falls.  Continue Xarelto 15 mg twice daily for 3 weeks, and then take 20 mg of Xarelto once daily until discontinued in 6 months.  Follow up with vascular surgery (referral was ordered today; either our office or their office will be in touch to schedule appointment).  Stay well hydrated. Drink plenty of water. Avoid sugary drinks as much as possible.

## 2016-06-07 ENCOUNTER — Ambulatory Visit (INDEPENDENT_AMBULATORY_CARE_PROVIDER_SITE_OTHER): Payer: PPO | Admitting: Vascular Surgery

## 2016-06-07 ENCOUNTER — Encounter (INDEPENDENT_AMBULATORY_CARE_PROVIDER_SITE_OTHER): Payer: Self-pay | Admitting: Vascular Surgery

## 2016-06-07 VITALS — BP 132/80 | HR 73 | Resp 16 | Ht 66.0 in | Wt 161.0 lb

## 2016-06-07 DIAGNOSIS — K219 Gastro-esophageal reflux disease without esophagitis: Secondary | ICD-10-CM

## 2016-06-07 DIAGNOSIS — E782 Mixed hyperlipidemia: Secondary | ICD-10-CM | POA: Diagnosis not present

## 2016-06-07 DIAGNOSIS — I10 Essential (primary) hypertension: Secondary | ICD-10-CM | POA: Diagnosis not present

## 2016-06-07 DIAGNOSIS — I82412 Acute embolism and thrombosis of left femoral vein: Secondary | ICD-10-CM

## 2016-06-07 DIAGNOSIS — I82409 Acute embolism and thrombosis of unspecified deep veins of unspecified lower extremity: Secondary | ICD-10-CM | POA: Insufficient documentation

## 2016-06-07 DIAGNOSIS — Z86718 Personal history of other venous thrombosis and embolism: Secondary | ICD-10-CM | POA: Insufficient documentation

## 2016-06-07 NOTE — Progress Notes (Signed)
MRN : [id number]  Matthew Clayton is a 83 y.o. (1927-03-20) male who presents with chief complaint of  Chief Complaint  Patient presents with  . New Evaluation    DVT left leg  .  History of Present Illness:The patient presents to the office for evaluation of left DVT.  DVT was identified at [facility] by Duplex ultrasound.  The initial symptoms were pain and swelling in the lower extremity.  The patient notes the leg continues to be very painful with dependency and swells quite a bite.  Symptoms are much better with elevation.  The patient notes minimal edema in the morning which steadily worsens throughout the day.    The patient has not been using compression therapy at this point.  No SOB or pleuritic chest pains.  No cough or hemoptysis.  No blood per rectum or blood in any sputum.  No excessive bruising per the patient.  Current Meds  Medication Sig  . HYDROcodone-homatropine (HYCODAN) 5-1.5 MG/5ML syrup 5 ml ever 6 to 8 hours as needed for cough  . Rivaroxaban 15 & 20 MG TBPK Take as directed on package: Start with one 15mg  tablet by mouth twice a day with food. On Day 22, switch to one 20mg  tablet once a day with food.  . simvastatin (ZOCOR) 40 MG tablet Take 1 tablet (40 mg total) by mouth daily.  . typhoid (VIVOTIF) DR capsule Take 1 capsule by mouth every other day.    Past Medical History:  Diagnosis Date  . Hyperlipidemia     Past Surgical History:  Procedure Laterality Date  . APPENDECTOMY    . MOLE REMOVAL    . TONSILLECTOMY AND ADENOIDECTOMY    . TOTAL HIP ARTHROPLASTY Bilateral     Social History Social History  Substance Use Topics  . Smoking status: Former Smoker    Packs/day: 1.50    Years: 20.00    Types: Cigarettes    Quit date: 05/16/1960  . Smokeless tobacco: Never Used     Comment: quit in 1968  . Alcohol use No    Family History Family History  Problem Relation Age of Onset  . Breast cancer Mother   . Heart disease Father   . Breast  cancer Sister   . Stroke Brother   . Breast cancer Sister   . Aneurysm Sister   No family history of bleeding/clotting disorders, porphyria or autoimmune disease   No Known Allergies   REVIEW OF SYSTEMS (Negative unless checked)  Constitutional: [] Weight loss  [] Fever  [] Chills Cardiac: [] Chest pain   [] Chest pressure   [] Palpitations   [] Shortness of breath when laying flat   [] Shortness of breath with exertion. Vascular:  [] Pain in legs with walking   [] Pain in legs at rest  [] History of DVT   [] Phlebitis   [x] Swelling in legs   [] Varicose veins   [] Non-healing ulcers Pulmonary:   [] Uses home oxygen   [] Productive cough   [] Hemoptysis   [] Wheeze  [] COPD   [] Asthma Neurologic:  [] Dizziness   [] Seizures   [] History of stroke   [] History of TIA  [] Aphasia   [] Vissual changes   [] Weakness or numbness in arm   [] Weakness or numbness in leg Musculoskeletal:   [] Joint swelling   [] Joint pain   [] Low back pain Hematologic:  [] Easy bruising  [] Easy bleeding   [] Hypercoagulable state   [] Anemic Gastrointestinal:  [] Diarrhea   [] Vomiting  [] Gastroesophageal reflux/heartburn   [] Difficulty swallowing. Genitourinary:  [] Chronic kidney disease   []   Difficult urination  [] Frequent urination   [] Blood in urine Skin:  [] Rashes   [] Ulcers  Psychological:  [] History of anxiety   []  History of major depression.  Physical Examination  Vitals:   06/07/16 1424  BP: 132/80  Pulse: 73  Resp: 16  Weight: 161 lb (73 kg)  Height: 5\' 6"  (1.676 m)   Body mass index is 25.99 kg/m. Gen: WD/WN, NAD Head: [place]/AT, No temporalis wasting.  Ear/Nose/Throat: Hearing grossly intact, nares w/o erythema or drainage, poor dentition Eyes: PER, EOMI, sclera nonicteric.  Neck: Supple, no masses.  No bruit or JVD.  Pulmonary:  Good air movement, clear to auscultation bilaterally, no use of accessory muscles.  Cardiac: RRR, normal S1, S2, no Murmurs. Vascular:   Left leg with 1+ pitting edema Vessel Right Left  Radial  Palpable Palpable  Ulnar Palpable Palpable  Brachial Palpable Palpable  Carotid Palpable Palpable  Femoral Palpable Palpable  Popliteal Palpable Palpable  PT Palpable Palpable  DP Palpable Palpable   Gastrointestinal: soft, non-distended. No guarding/no peritoneal signs.  Musculoskeletal: M/S 5/5 throughout.  No deformity or atrophy.  Neurologic: CN 2-12 intact. Pain and light touch intact in extremities.  Symmetrical.  Speech is fluent. Motor exam as listed above. Psychiatric: Judgment intact, Mood & affect appropriate for pt's clinical situation. Dermatologic: No rashes or ulcers noted.  No changes consistent with cellulitis. Lymph : No Cervical lymphadenopathy, no lichenification or skin changes of chronic lymphedema.  CBC Lab Results  Component Value Date   WBC 9.0 05/17/2016   HGB 14.1 05/17/2016   HCT 40.4 05/17/2016   MCV 93.2 05/17/2016   PLT 178 05/17/2016    BMET    Component Value Date/Time   NA 135 05/17/2016 1249   NA 141 12/02/2015 1211   K 3.9 05/17/2016 1249   CL 102 05/17/2016 1249   CO2 25 05/17/2016 1249   GLUCOSE 112 (H) 05/17/2016 1249   BUN 14 05/17/2016 1249   BUN 19 12/02/2015 1211   CREATININE 1.21 05/17/2016 1249   CALCIUM 9.4 05/17/2016 1249   GFRNONAA 52 (L) 05/17/2016 1249   GFRAA >60 05/17/2016 1249   CrCl cannot be calculated (Patient's most recent lab result is older than the maximum 21 days allowed.).  COAG No results found for: INR, PROTIME  Radiology Ct Angio Chest Pe W And/or Wo Contrast  Result Date: 05/17/2016 CLINICAL DATA:  Extensive left lower extremity DVT. EXAM: CT ANGIOGRAPHY CHEST WITH CONTRAST TECHNIQUE: Multidetector CT imaging of the chest was performed using the standard protocol during bolus administration of intravenous contrast. Multiplanar CT image reconstructions and MIPs were obtained to evaluate the vascular anatomy. CONTRAST:  75 mL Isovue 370 IV COMPARISON:  None. FINDINGS: Cardiovascular: The pulmonary  arteries are adequately opacified. There may be very subtle nonocclusive mural thrombus in right lower lobe pulmonary artery branches. Based on appearance, this may be reflective of prior pulmonary embolism and does not have the appearance of acute thrombus. The heart size is normal. No evidence of pericardial effusion. Mediastinum/Nodes: Mildly prominent right-sided hilar lymph node tissue without overt enlargement. No evidence of mediastinal or axillary lymphadenopathy. No mediastinal masses. Lungs/Pleura: There is no evidence of pulmonary edema, consolidation, pneumothorax, nodule or pleural fluid. Upper Abdomen: No acute abnormality. Musculoskeletal: No chest wall abnormality. No acute or significant osseous findings. Review of the MIP images confirms the above findings. IMPRESSION: Potentially very subtle nonocclusive mural thrombus in right lower lobe pulmonary artery branches. This does not have the appearance of acute pulmonary embolism  and may reflect prior PE. Mildly prominent right hilar lymph node tissue identified without obvious pathologic lymphadenopathy. Electronically Signed   By: Aletta Edouard M.D.   On: 05/17/2016 14:03   US Venous Img Lower Unilateral Left  Result Date: 05/17/2016 CLINICAL DATA:  Left lower extremity edema.  Evaluate for DVT. EXAM: LEFT LOWER EXTREMITY VENOUS DOPPLER ULTRASOUND TECHNIQUE: Gray-scale sonography with graded compression, as well as color Doppler and duplex ultrasound were performed to evaluate the lower extremity deep venous systems from the level of the common femoral vein and including the common femoral, femoral, profunda femoral, popliteal and calf veins including the posterior tibial, peroneal and gastrocnemius veins when visible. The superficial great saphenous vein was also interrogated. Spectral Doppler was utilized to evaluate flow at rest and with distal augmentation maneuvers in the common femoral, femoral and popliteal veins. COMPARISON:  None.  FINDINGS: Contralateral Common Femoral Vein: Respiratory phasicity is normal and symmetric with the symptomatic side. No evidence of thrombus. Normal compressibility. There is mixed echogenic nonocclusive thrombus within the left common femoral vein (image 7). The saphenofemoral junction and deep femoral veins appear patent where imaged (image 22). There is mixed echogenic occlusive thrombus throughout the interrogated course of the left femoral (images 7, 20 and 23) and popliteal veins (images 26 and 29) extending into the interrogated portions of the left posterior tibial and peroneal veins. Other Findings:  None. IMPRESSION: Examination is positive for extensive largely occlusive DVT extending from the left common femoral vein through the imaged tibial veins. Electronically Signed   By: Sandi Mariscal M.D.   On: 05/17/2016 13:40    Assessment/Plan 1. Acute deep vein thrombosis (DVT) of femoral vein of left lower extremity (HCC) Recommend:   No surgery or intervention at this point in time.  IVC filter is not indicated at present.  Patient's duplex ultrasound of the venous system shows DVT from the popliteal to the femoral veins.  The patient is initiated on anticoagulation   Elevation was stressed, use of a recliner was discussed.  I have had a long discussion with the patient regarding DVT and post phlebitic changes such as swelling and why it  causes symptoms such as pain.  The patient will wear graduated compression stockings class 1 (20-30 mmHg), beginning after three full days of anticoagulation, on a daily basis a prescription was given. The patient will  beginning wearing the stockings first thing in the morning and removing them in the evening. The patient is instructed specifically not to sleep in the stockings.  In addition, behavioral modification including elevation during the day and avoidance of prolonged dependency will be initiated.    The patient will continue anticoagulation for  now as there have not been any problems or complications at this point.    A total of 65 minutes was spent with this patient and greater than 50% was spent in counseling and coordination of care with the patient.  Discussion included the treatment options for vascular disease including indications for surgery and intervention.  Also discussed is the appropriate timing of treatment.  In addition medical therapy was discussed.   - VAS [place] LOWER EXTREMITY VENOUS (DVT); Future  2. Essential hypertension Continue antihypertensive medications as already ordered, these medications have been reviewed and there are no changes at this time.  3. Mixed hyperlipidemia Continue statin as ordered and reviewed, no changes at this time  4. Gastroesophageal reflux disease without esophagitis Continue PPI medications as already ordered, these medications have been reviewed and  there are no changes at this time.     Hortencia Pilar, MD  06/07/2016 5:21 PM

## 2016-06-08 ENCOUNTER — Telehealth (INDEPENDENT_AMBULATORY_CARE_PROVIDER_SITE_OTHER): Payer: Self-pay | Admitting: Vascular Surgery

## 2016-06-08 NOTE — Telephone Encounter (Signed)
States husband was here to see Dr. Delana Meyer 06/07/16 and that he suggested they get a recliner to elevate and said that he should try to elevate above his heart. She has questions about the fact that recliners don't recline that far so she wants more explanation or possibly some direction on if and where she could get that type of recliner from.

## 2016-06-08 NOTE — Telephone Encounter (Signed)
Explained to the wife that she could placed the recliner all the way back and place pillows under the patient's feet and also she could do the same thing on the sofa. She was very understanding of this.

## 2016-06-16 ENCOUNTER — Ambulatory Visit (INDEPENDENT_AMBULATORY_CARE_PROVIDER_SITE_OTHER): Payer: PPO | Admitting: Family Medicine

## 2016-06-16 ENCOUNTER — Encounter: Payer: Self-pay | Admitting: Family Medicine

## 2016-06-16 VITALS — BP 126/72 | HR 58 | Temp 98.6°F | Resp 16 | Wt 165.0 lb

## 2016-06-16 DIAGNOSIS — I82412 Acute embolism and thrombosis of left femoral vein: Secondary | ICD-10-CM

## 2016-06-16 DIAGNOSIS — J3089 Other allergic rhinitis: Secondary | ICD-10-CM

## 2016-06-16 MED ORDER — AZITHROMYCIN 250 MG PO TABS: ORAL_TABLET | ORAL | 0 refills | Status: DC

## 2016-06-16 MED ORDER — CETIRIZINE HCL 10 MG PO TABS: 10.0000 mg | ORAL_TABLET | Freq: Every day | ORAL | 11 refills | Status: DC

## 2016-06-16 NOTE — Patient Instructions (Signed)
May stop simvastatin for 5 days while taking Zpak.

## 2016-06-16 NOTE — Progress Notes (Signed)
       Patient: Matthew Clayton Male    DOB: [birth year]   83 y.o.   MRN: [id number] Visit Date: 06/16/2016  Today's Provider: [name], MD   Chief Complaint  Patient presents with  . DVT   Subjective:    HPI Patient comes in today for a 1 month follow up on acute DVT. Since last OV, patient was advised to continue Xarelto 15mg  daily for 3 weeks then 20mg  daily for the next 6 months. Patient reports that he is tolerating the medication well with no side effects. Patient also mentions that he saw Dr. Delana Meyer last week and no changes were made.     No Known Allergies   Current Outpatient Prescriptions:  .  Rivaroxaban 15 & 20 MG TBPK, Take as directed on package: Start with one 15mg  tablet by mouth twice a day with food. On Day 22, switch to one 20mg  tablet once a day with food., Disp: 51 each, Rfl: 0 .  simvastatin (ZOCOR) 40 MG tablet, Take 1 tablet (40 mg total) by mouth daily., Disp: 90 tablet, Rfl: 3 .  HYDROcodone-homatropine (HYCODAN) 5-1.5 MG/5ML syrup, 5 ml ever 6 to 8 hours as needed for cough (Patient not taking: Reported on 06/16/2016), Disp: 240 mL, Rfl: 0 .  typhoid (VIVOTIF) DR capsule, Take 1 capsule by mouth every other day., Disp: 4 capsule, Rfl: 0  Review of Systems  Constitutional: Negative.   Respiratory: Negative.   Cardiovascular: Positive for leg swelling. Negative for chest pain and palpitations.  Musculoskeletal: Negative.     Social History  Substance Use Topics  . Smoking status: Former Smoker    Packs/day: 1.50    Years: 20.00    Types: Cigarettes    Quit date: 05/16/1960  . Smokeless tobacco: Never Used     Comment: quit in 1968  . Alcohol use No   Objective:   BP 126/72 (BP Location: Right Arm, Patient Position: Sitting, Cuff Size: Normal)   Pulse (!) 58   Temp 98.6 F (37 C)   Resp 16   Wt 165 lb (74.8 kg)   BMI 26.63 kg/m   Physical Exam  Constitutional: He is oriented to person, place, and time. He appears well-developed and  well-nourished.  HENT:  Mouth/Throat: Oropharynx is clear and moist.  Cardiovascular: Normal rate, regular rhythm and normal heart sounds.   Pulmonary/Chest: Effort normal and breath sounds normal.  Musculoskeletal: He exhibits edema.  14 inches in diameter in the right leg. 15 inches in diameter in the left leg.   Neurological: He is alert and oriented to person, place, and time.  Skin: Skin is warm and dry.        Assessment & Plan:     Acute deep vein thrombosis (DVT) of femoral vein of left lower extremity (HCC)  Allergic rhinitis due to other allergic trigger, unspecified chronicity, unspecified seasonality - Plan: cetirizine (ZYRTEC) 10 MG tablet, azithromycin (ZITHROMAX) 250 MG tablet  Return to clinic 2 months. Anticoagulate for total of 6 months.      I have done the exam and reviewed the above chart and it is accurate to the best of my knowledge. Development worker, community has been used in this note in any air is in the dictation or transcription are unintentional.  [name], MD  [place]

## 2016-11-04 ENCOUNTER — Ambulatory Visit (INDEPENDENT_AMBULATORY_CARE_PROVIDER_SITE_OTHER): Payer: PPO

## 2016-11-04 ENCOUNTER — Ambulatory Visit (INDEPENDENT_AMBULATORY_CARE_PROVIDER_SITE_OTHER): Payer: PPO | Admitting: Vascular Surgery

## 2016-11-04 ENCOUNTER — Encounter (INDEPENDENT_AMBULATORY_CARE_PROVIDER_SITE_OTHER): Payer: Self-pay | Admitting: Vascular Surgery

## 2016-11-04 VITALS — BP 130/74 | HR 56 | Resp 14 | Ht 65.5 in | Wt 169.0 lb

## 2016-11-04 DIAGNOSIS — I10 Essential (primary) hypertension: Secondary | ICD-10-CM

## 2016-11-04 DIAGNOSIS — I82412 Acute embolism and thrombosis of left femoral vein: Secondary | ICD-10-CM

## 2016-11-04 DIAGNOSIS — E782 Mixed hyperlipidemia: Secondary | ICD-10-CM

## 2016-11-04 NOTE — Progress Notes (Signed)
MRN : [id number]  Matthew Clayton is a 83 y.o. (1926/10/07) male who presents with chief complaint of No chief complaint on file. Marland Kitchen  History of Present Illness:  The patient presents to the office for evaluation of DVT.  DVT was identified at [facility] by Duplex ultrasound.  The initial symptoms were swelling in the lower extremity.  He was diagnosed in early January 2018.  He had flown back from [place] April 21, 2016.  The patient notes the leg continues to swells quite a bite but is not particularly painful with dependency.  Symptoms are much better with elevation.  The patient notes minimal edema in the morning which steadily worsens throughout the day.  He continues to wear medium graduated compression.  The patient has not been using compression therapy at this point.  No SOB or pleuritic chest pains.  No cough or hemoptysis.  No blood per rectum or blood in any sputum.  No excessive bruising per the patient.   Duplex ultrasound of the left lower extremity shows stable chronic changes in the left leg venous system.  Baker's cyst is noted left side    No outpatient prescriptions have been marked as taking for the 11/04/16 encounter (Appointment) with Delana Meyer, Dolores Lory, MD.    Past Medical History:  Diagnosis Date  . Hyperlipidemia     Past Surgical History:  Procedure Laterality Date  . APPENDECTOMY    . MOLE REMOVAL    . TONSILLECTOMY AND ADENOIDECTOMY    . TOTAL HIP ARTHROPLASTY Bilateral     Social History Social History  Substance Use Topics  . Smoking status: Former Smoker    Packs/day: 1.50    Years: 20.00    Types: Cigarettes    Quit date: 05/16/1960  . Smokeless tobacco: Never Used     Comment: quit in 1968  . Alcohol use No    Family History Family History  Problem Relation Age of Onset  . Breast cancer Mother   . Heart disease Father   . Breast cancer Sister   . Stroke Brother   . Breast cancer Sister   . Aneurysm Sister     No Known  Allergies   REVIEW OF SYSTEMS (Negative unless checked)  Constitutional: [] Weight loss  [] Fever  [] Chills Cardiac: [] Chest pain   [] Chest pressure   [] Palpitations   [] Shortness of breath when laying flat   [] Shortness of breath with exertion. Vascular:  [] Pain in legs with walking   [] Pain in legs at rest  [x] History of DVT   [] Phlebitis   [x] Swelling in legs   [] Varicose veins   [] Non-healing ulcers Pulmonary:   [] Uses home oxygen   [] Productive cough   [] Hemoptysis   [] Wheeze  [] COPD   [] Asthma Neurologic:  [] Dizziness   [] Seizures   [] History of stroke   [] History of TIA  [] Aphasia   [] Vissual changes   [] Weakness or numbness in arm   [] Weakness or numbness in leg Musculoskeletal:   [] Joint swelling   [] Joint pain   [] Low back pain Hematologic:  [x] Easy bruising  [] Easy bleeding   [] Hypercoagulable state   [] Anemic Gastrointestinal:  [] Diarrhea   [] Vomiting  [] Gastroesophageal reflux/heartburn   [] Difficulty swallowing. Genitourinary:  [] Chronic kidney disease   [] Difficult urination  [] Frequent urination   [] Blood in urine Skin:  [] Rashes   [] Ulcers  Psychological:  [] History of anxiety   []  History of major depression.  Physical Examination  There were no vitals filed for this visit. There is no  height or weight on file to calculate BMI. Gen: WD/WN, NAD Head: [place]/AT, No temporalis wasting.  Ear/Nose/Throat: Hearing grossly intact, nares w/o erythema or drainage Eyes: PER, EOMI, sclera nonicteric.  Neck: Supple, no large masses.   Pulmonary:  Good air movement, no audible wheezing bilaterally, no use of accessory muscles.  Cardiac: RRR, no JVD Vascular: Scattered small varicosities present bilaterally.  Mild venous stasis changes to the legs bilaterally.  2+ soft pitting edema left leg Vessel Right Left  Radial Palpable Palpable  PT Palpable Palpable  DP Palpable Palpable  Gastrointestinal: Non-distended. No guarding/no peritoneal signs.  Musculoskeletal: M/S 5/5 throughout.  No  deformity or atrophy.  Neurologic: CN 2-12 intact. Symmetrical.  Speech is fluent. Motor exam as listed above. Psychiatric: Judgment intact, Mood & affect appropriate for pt's clinical situation. Dermatologic: No rashes or ulcers noted.  No changes consistent with cellulitis. Lymph : No lichenification or skin changes of chronic lymphedema.  CBC Lab Results  Component Value Date   WBC 9.0 05/17/2016   HGB 14.1 05/17/2016   HCT 40.4 05/17/2016   MCV 93.2 05/17/2016   PLT 178 05/17/2016    BMET    Component Value Date/Time   NA 135 05/17/2016 1249   NA 141 12/02/2015 1211   K 3.9 05/17/2016 1249   CL 102 05/17/2016 1249   CO2 25 05/17/2016 1249   GLUCOSE 112 (H) 05/17/2016 1249   BUN 14 05/17/2016 1249   BUN 19 12/02/2015 1211   CREATININE 1.21 05/17/2016 1249   CALCIUM 9.4 05/17/2016 1249   GFRNONAA 52 (L) 05/17/2016 1249   GFRAA >60 05/17/2016 1249   CrCl cannot be calculated (Patient's most recent lab result is older than the maximum 21 days allowed.).  COAG No results found for: INR, PROTIME  Radiology No results found.   Assessment/Plan 1. Acute deep vein thrombosis (DVT) of femoral vein of left lower extremity (HCC) Recommend:   No surgery or intervention at this point in time.  IVC filter is not indicated at present.  Duplex ultrasound of the left lower extremity shows stable chronic changes in the left leg venous system.  Baker's cyst is noted left side  The patient is has completed 6 months of Eliquis anticoagulation   Elevation was stressed, use of a recliner was discussed.  I have had a long discussion with the patient regarding DVT and post phlebitic changes such as swelling and why it  causes symptoms such as pain.  The patient will wear graduated compression stockings class 1 (20-30 mmHg), beginning after three full days of anticoagulation, on a daily basis a prescription was given. The patient will  beginning wearing the stockings first thing in the  morning and removing them in the evening. The patient is instructed specifically not to sleep in the stockings.  In addition, behavioral modification including elevation during the day and avoidance of prolonged dependency will be initiated.    The patient will continue anticoagulation for now as there have not been any problems or complications at this point. Having completed 6 months and noting the stable duplex he will stop the Eliquis at the end of the months  I will refer hism to Hematology for hypercoagulable workup   A total of 30 minutes was spent with this patient and greater than 50% was spent in counseling and coordination of care with the patient.  Discussion included the treatment options for vascular disease including indications for surgery and intervention.  Also discussed is the appropriate timing of  treatment.  In addition medical therapy was discussed.   2. Essential hypertension Continue antihypertensive medications as already ordered, these medications have been reviewed and there are no changes at this time.   3. Mixed hyperlipidemia Continue statin as ordered and reviewed, no changes at this time     Hortencia Pilar, MD  11/04/2016 12:20 PM

## 2016-11-08 ENCOUNTER — Ambulatory Visit (INDEPENDENT_AMBULATORY_CARE_PROVIDER_SITE_OTHER): Payer: PPO | Admitting: Vascular Surgery

## 2016-11-08 ENCOUNTER — Encounter (INDEPENDENT_AMBULATORY_CARE_PROVIDER_SITE_OTHER): Payer: PPO

## 2016-11-24 ENCOUNTER — Telehealth: Payer: Self-pay | Admitting: Family Medicine

## 2016-12-08 NOTE — Progress Notes (Signed)
[place]  [phone] [phone]) [phone]  ID: Matthew Clayton OB: [birth year]  MR#: [id number]  ZYS#:[id number]  Patient Care Team: Jerrol Banana., MD as PCP - General (Family Medicine)  CHIEF COMPLAINT: Left femoral DVT  INTERVAL HISTORY: Patient is an 83 year old male who developed a large left femoral DVT in December 2017 on a flight back from [place]. He took 6 months of anticoagulation which has now been discontinued. Repeat lower extremity ultrasound did not reveal any residual clot. He is referred to clinic today to determine whether a full hypercoagulable workup is necessary. He currently feels well and is asymptomatic. He has no neurologic complaints. He denies any recent fevers or illnesses. He has a good appetite and denies weight loss. He has no chest pain or shortness of breath. He denies any nausea, vomiting, constipation, or diarrhea. He has no urinary complaints. Patient feels at his baseline and offers no specific complaints today.  REVIEW OF SYSTEMS:   Review of Systems  Constitutional: Negative.  Negative for fever, malaise/fatigue and weight loss.  Respiratory: Negative.  Negative for cough and shortness of breath.   Cardiovascular: Negative.  Negative for chest pain and leg swelling.  Gastrointestinal: Negative.  Negative for abdominal pain.  Genitourinary: Negative.   Musculoskeletal: Negative.   Skin: Negative.  Negative for rash.  Neurological: Negative.  Negative for weakness.  Endo/Heme/Allergies: Negative.  Does not bruise/bleed easily.  Psychiatric/Behavioral: The patient is not nervous/anxious.     As per HPI. Otherwise, a complete review of systems is negative.  PAST MEDICAL HISTORY: Past Medical History:  Diagnosis Date  . DVT (deep venous thrombosis) (HCC)    left leg  . Hyperlipidemia     PAST SURGICAL HISTORY: Past Surgical History:  Procedure Laterality Date  . APPENDECTOMY    . MOLE REMOVAL    .  TONSILLECTOMY AND ADENOIDECTOMY    . TOTAL HIP ARTHROPLASTY Bilateral     FAMILY HISTORY: Family History  Problem Relation Age of Onset  . Breast cancer Mother   . Cancer Mother        eye cancer  . Heart disease Father   . Breast cancer Sister   . Stroke Brother   . Heart attack Brother   . Breast cancer Sister   . Aneurysm Sister     ADVANCED DIRECTIVES (Y/N):  N  HEALTH MAINTENANCE: Social History  Substance Use Topics  . Smoking status: Former Smoker    Packs/day: 1.50    Years: 20.00    Types: Cigarettes    Quit date: 05/16/1960  . Smokeless tobacco: Never Used     Comment: quit in 1968  . Alcohol use No     Colonoscopy:  PAP:  Bone density:  Lipid panel:  No Known Allergies  Current Outpatient Prescriptions  Medication Sig Dispense Refill  . aspirin EC 81 MG tablet Take 81 mg by mouth daily.    . cetirizine (ZYRTEC) 10 MG tablet Take 1 tablet (10 mg total) by mouth daily. 30 tablet 11  . simvastatin (ZOCOR) 40 MG tablet Take 1 tablet (40 mg total) by mouth daily. 90 tablet 3  . HYDROcodone-homatropine (HYCODAN) 5-1.5 MG/5ML syrup 5 ml ever 6 to 8 hours as needed for cough (Patient not taking: Reported on 12/09/2016) 240 mL 0  . Rivaroxaban 15 & 20 MG TBPK Take as directed on package: Start with one 15mg  tablet by mouth twice a day with food. On Day 22, switch to one 20mg  tablet once  a day with food. (Patient not taking: Reported on 12/09/2016) 51 each 0   No current facility-administered medications for this visit.     OBJECTIVE: Vitals:   12/09/16 1517  BP: (!) 144/72  Pulse: 61  Resp: 18  Temp: (!) 97.2 F (36.2 C)     Body mass index is 27.76 kg/m.    ECOG FS:0 - Asymptomatic  General: Well-developed, well-nourished, no acute distress. Eyes: Pink conjunctiva, anicteric sclera. HEENT: Normocephalic, moist mucous membranes, clear oropharnyx. Lungs: Clear to auscultation bilaterally. Heart: Regular rate and rhythm. No rubs, murmurs, or  gallops. Abdomen: Soft, nontender, nondistended. No organomegaly noted, normoactive bowel sounds. Musculoskeletal: No edema, cyanosis, or clubbing. Neuro: Alert, answering all questions appropriately. Cranial nerves grossly intact. Skin: No rashes or petechiae noted. Psych: Normal affect. Lymphatics: No cervical, calvicular, axillary or inguinal LAD.   LAB RESULTS:  Lab Results  Component Value Date   NA 135 05/17/2016   K 3.9 05/17/2016   CL 102 05/17/2016   CO2 25 05/17/2016   GLUCOSE 112 (H) 05/17/2016   BUN 14 05/17/2016   CREATININE 1.21 05/17/2016   CALCIUM 9.4 05/17/2016   PROT 7.0 12/02/2015   ALBUMIN 4.2 12/02/2015   AST 13 12/02/2015   ALT 14 12/02/2015   ALKPHOS 72 12/02/2015   BILITOT 0.4 12/02/2015   GFRNONAA 52 (L) 05/17/2016   GFRAA >60 05/17/2016    Lab Results  Component Value Date   WBC 9.0 05/17/2016   NEUTROABS 7.0 (H) 05/17/2016   HGB 14.1 05/17/2016   HCT 40.4 05/17/2016   MCV 93.2 05/17/2016   PLT 178 05/17/2016     STUDIES: No results found.  ASSESSMENT: Left femoral DVT  PLAN:    1. Left femoral DVT:  Patient has now completed the recommended 3 months of anticoagulation. He had an obvious transient risk factor with extended slight from [place]. Patient has already been educated on preventative measures he can take for future clots. It is unlikely patient has an underlying hypercoagulable disorder, but will send factor V Leiden and prothrombin gene mutation to assess if there is any genetic component since patient has multiple children, grandchildren, and great-grandchildren. No further intervention is needed at this time. No follow-up has been scheduled.  The patient will be called with the results of his blood work.  Approximately 45 minutes was spent in discussion of which greater than 50% was consultation.  Patient expressed understanding and was in agreement with this plan. He also understands that He can call clinic at any time with any  questions, concerns, or complaints.    Lloyd Huger, MD   12/11/2016 7:07 AM

## 2016-12-09 ENCOUNTER — Encounter: Payer: Self-pay | Admitting: Oncology

## 2016-12-09 ENCOUNTER — Inpatient Hospital Stay: Payer: PPO | Attending: Oncology | Admitting: Oncology

## 2016-12-09 ENCOUNTER — Inpatient Hospital Stay: Payer: PPO

## 2016-12-09 VITALS — BP 144/72 | HR 61 | Temp 97.2°F | Resp 18 | Wt 169.4 lb

## 2016-12-09 DIAGNOSIS — Z87891 Personal history of nicotine dependence: Secondary | ICD-10-CM | POA: Diagnosis not present

## 2016-12-09 DIAGNOSIS — I82412 Acute embolism and thrombosis of left femoral vein: Secondary | ICD-10-CM

## 2016-12-09 DIAGNOSIS — Z79899 Other long term (current) drug therapy: Secondary | ICD-10-CM | POA: Diagnosis not present

## 2016-12-09 DIAGNOSIS — Z96643 Presence of artificial hip joint, bilateral: Secondary | ICD-10-CM | POA: Insufficient documentation

## 2016-12-09 DIAGNOSIS — E785 Hyperlipidemia, unspecified: Secondary | ICD-10-CM | POA: Diagnosis not present

## 2016-12-09 DIAGNOSIS — Z7982 Long term (current) use of aspirin: Secondary | ICD-10-CM | POA: Insufficient documentation

## 2016-12-09 DIAGNOSIS — Z86718 Personal history of other venous thrombosis and embolism: Secondary | ICD-10-CM | POA: Diagnosis not present

## 2016-12-09 NOTE — Progress Notes (Signed)
Patient is here for new patient evaluation. He has a history of DVT in left leg.

## 2016-12-14 LAB — PROTHROMBIN GENE MUTATION

## 2016-12-14 LAB — FACTOR 5 LEIDEN

## 2016-12-16 ENCOUNTER — Ambulatory Visit (INDEPENDENT_AMBULATORY_CARE_PROVIDER_SITE_OTHER): Payer: PPO

## 2016-12-16 VITALS — BP 122/78 | HR 68 | Temp 97.5°F | Ht 66.0 in | Wt 172.8 lb

## 2016-12-16 DIAGNOSIS — Z0000 Encounter for general adult medical examination without abnormal findings: Secondary | ICD-10-CM

## 2016-12-16 NOTE — Patient Instructions (Addendum)
Matthew Clayton , Thank you for taking time to come for your Medicare Wellness Visit. I appreciate your ongoing commitment to your health goals. Please review the following plan we discussed and let me know if I can assist you in the future.   Screening recommendations/referrals: Colonoscopy: completed 06/03/10 Recommended yearly ophthalmology/optometry visit for glaucoma screening and checkup Recommended yearly dental visit for hygiene and checkup   Vaccinations: Influenza vaccine: due 01/2017 Pneumococcal vaccine: completed Prevnar 13 02/12/16, Pneumovax 23 due 01/2017 Tdap vaccine: completed 09/15/12, due 09/2022 Shingles vaccine: completed 05/23/09   Advanced directives: Please bring a copy of your POA (Power of [place]) and/or Living Will to your next appointment.   Conditions/risks identified: Recommend increasing water intake to 4 glasses of water a day.   Next appointment: None, need to schedule follow up with PCP and 1 year AWV  Preventive Care 65 Years and Older, Male Preventive care refers to lifestyle choices and visits with your health care provider that can promote health and wellness. What does preventive care include?  A yearly physical exam. This is also called an annual well check.  Dental exams once or twice a year.  Routine eye exams. Ask your health care provider how often you should have your eyes checked.  Personal lifestyle choices, including:  Daily care of your teeth and gums.  Regular physical activity.  Eating a healthy diet.  Avoiding tobacco and drug use.  Limiting alcohol use.  Practicing safe sex.  Taking low doses of aspirin every day.  Taking vitamin and mineral supplements as recommended by your health care provider. What happens during an annual well check? The services and screenings done by your health care provider during your annual well check will depend on your age, overall health, lifestyle risk factors, and family history of  disease. Counseling  Your health care provider may ask you questions about your:  Alcohol use.  Tobacco use.  Drug use.  Emotional well-being.  Home and relationship well-being.  Sexual activity.  Eating habits.  History of falls.  Memory and ability to understand (cognition).  Work and work Statistician. Screening  You may have the following tests or measurements:  Height, weight, and BMI.  Blood pressure.  Lipid and cholesterol levels. These may be checked every 5 years, or more frequently if you are over 67 years old.  Skin check.  Lung cancer screening. You may have this screening every year starting at age 54 if you have a 30-pack-year history of smoking and currently smoke or have quit within the past 15 years.  Fecal occult blood test (FOBT) of the stool. You may have this test every year starting at age 82.  Flexible sigmoidoscopy or colonoscopy. You may have a sigmoidoscopy every 5 years or a colonoscopy every 10 years starting at age 53.  Prostate cancer screening. Recommendations will vary depending on your family history and other risks.  Hepatitis C blood test.  Hepatitis B blood test.  Sexually transmitted disease (STD) testing.  Diabetes screening. This is done by checking your blood sugar (glucose) after you have not eaten for a while (fasting). You may have this done every 1-3 years.  Abdominal aortic aneurysm (AAA) screening. You may need this if you are a current or former smoker.  Osteoporosis. You may be screened starting at age 62 if you are at high risk. Talk with your health care provider about your test results, treatment options, and if necessary, the need for more tests. Vaccines  Your health  care provider may recommend certain vaccines, such as:  Influenza vaccine. This is recommended every year.  Tetanus, diphtheria, and acellular pertussis (Tdap, Td) vaccine. You may need a Td booster every 10 years.  Zoster vaccine. You may  need this after age 8.  Pneumococcal 13-valent conjugate (PCV13) vaccine. One dose is recommended after age 66.  Pneumococcal polysaccharide (PPSV23) vaccine. One dose is recommended after age 40. Talk to your health care provider about which screenings and vaccines you need and how often you need them. This information is not intended to replace advice given to you by your health care provider. Make sure you discuss any questions you have with your health care provider. Document Released: 05/30/2015 Document Revised: 01/21/2016 Document Reviewed: 03/04/2015 Elsevier Interactive Patient Education  2017 [place] Prevention in the Home Falls can cause injuries. They can happen to people of all ages. There are many things you can do to make your home safe and to help prevent falls. What can I do on the outside of my home?  Regularly fix the edges of walkways and driveways and fix any cracks.  Remove anything that might make you trip as you walk through a door, such as a raised step or threshold.  Trim any bushes or trees on the path to your home.  Use bright outdoor lighting.  Clear any walking paths of anything that might make someone trip, such as rocks or tools.  Regularly check to see if handrails are loose or broken. Make sure that both sides of any steps have handrails.  Any raised decks and porches should have guardrails on the edges.  Have any leaves, snow, or ice cleared regularly.  Use sand or salt on walking paths during winter.  Clean up any spills in your garage right away. This includes oil or grease spills. What can I do in the bathroom?  Use night lights.  Install grab bars by the toilet and in the tub and shower. Do not use towel bars as grab bars.  Use non-skid mats or decals in the tub or shower.  If you need to sit down in the shower, use a plastic, non-slip stool.  Keep the floor dry. Clean up any water that spills on the floor as soon as it  happens.  Remove soap buildup in the tub or shower regularly.  Attach bath mats securely with double-sided non-slip rug tape.  Do not have throw rugs and other things on the floor that can make you trip. What can I do in the bedroom?  Use night lights.  Make sure that you have a light by your bed that is easy to reach.  Do not use any sheets or blankets that are too big for your bed. They should not hang down onto the floor.  Have a firm chair that has side arms. You can use this for support while you get dressed.  Do not have throw rugs and other things on the floor that can make you trip. What can I do in the kitchen?  Clean up any spills right away.  Avoid walking on wet floors.  Keep items that you use a lot in easy-to-reach places.  If you need to reach something above you, use a strong step stool that has a grab bar.  Keep electrical cords out of the way.  Do not use floor polish or wax that makes floors slippery. If you must use wax, use non-skid floor wax.  Do not have  throw rugs and other things on the floor that can make you trip. What can I do with my stairs?  Do not leave any items on the stairs.  Make sure that there are handrails on both sides of the stairs and use them. Fix handrails that are broken or loose. Make sure that handrails are as long as the stairways.  Check any carpeting to make sure that it is firmly attached to the stairs. Fix any carpet that is loose or worn.  Avoid having throw rugs at the top or bottom of the stairs. If you do have throw rugs, attach them to the floor with carpet tape.  Make sure that you have a light switch at the top of the stairs and the bottom of the stairs. If you do not have them, ask someone to add them for you. What else can I do to help prevent falls?  Wear shoes that:  Do not have high heels.  Have rubber bottoms.  Are comfortable and fit you well.  Are closed at the toe. Do not wear sandals.  If you  use a stepladder:  Make sure that it is fully opened. Do not climb a closed stepladder.  Make sure that both sides of the stepladder are locked into place.  Ask someone to hold it for you, if possible.  Clearly mark and make sure that you can see:  Any grab bars or handrails.  First and last steps.  Where the edge of each step is.  Use tools that help you move around (mobility aids) if they are needed. These include:  Canes.  Walkers.  Scooters.  Crutches.  Turn on the lights when you go into a dark area. Replace any light bulbs as soon as they burn out.  Set up your furniture so you have a clear path. Avoid moving your furniture around.  If any of your floors are uneven, fix them.  If there are any pets around you, be aware of where they are.  Review your medicines with your doctor. Some medicines can make you feel dizzy. This can increase your chance of falling. Ask your doctor what other things that you can do to help prevent falls. This information is not intended to replace advice given to you by your health care provider. Make sure you discuss any questions you have with your health care provider. Document Released: 02/27/2009 Document Revised: 10/09/2015 Document Reviewed: 06/07/2014 Elsevier Interactive Patient Education  2017 [employer].

## 2016-12-16 NOTE — Progress Notes (Signed)
Subjective:   Matthew Clayton is a 83 y.o. male who presents for Medicare Annual/Subsequent preventive examination.  Review of Systems:  N/A  Cardiac Risk Factors include: advanced age (>49men, >90 women);dyslipidemia;male gender     Objective:    Vitals: BP 122/78 (BP Location: Left Arm)   Pulse 68   Temp (!) 97.5 F (36.4 C) (Oral)   Ht 5\' 6"  (1.676 m)   Wt 172 lb 12.8 oz (78.4 kg)   BMI 27.89 kg/m   Body mass index is 27.89 kg/m.  Tobacco History  Smoking Status  . Former Smoker  . Packs/day: 1.50  . Years: 20.00  . Types: Cigarettes  . Quit date: 05/16/1960  Smokeless Tobacco  . Never Used    Comment: quit in 1968     Counseling given: Not Answered   Past Medical History:  Diagnosis Date  . DVT (deep venous thrombosis) (HCC)    left leg  . Hyperlipidemia    Past Surgical History:  Procedure Laterality Date  . APPENDECTOMY    . MOLE REMOVAL    . TONSILLECTOMY AND ADENOIDECTOMY    . TOTAL HIP ARTHROPLASTY Bilateral    Family History  Problem Relation Age of Onset  . Breast cancer Mother   . Cancer Mother        eye cancer  . Heart disease Father   . Breast cancer Sister   . Stroke Brother   . Heart attack Brother   . Breast cancer Sister   . Aneurysm Sister    History  Sexual Activity  . Sexual activity: Not on file    Outpatient Encounter Prescriptions as of 12/16/2016  Medication Sig  . aspirin EC 81 MG tablet Take 81 mg by mouth daily.  . cetirizine (ZYRTEC) 10 MG tablet Take 1 tablet (10 mg total) by mouth daily.  . simvastatin (ZOCOR) 40 MG tablet Take 1 tablet (40 mg total) by mouth daily.  . [DISCONTINUED] HYDROcodone-homatropine (HYCODAN) 5-1.5 MG/5ML syrup 5 ml ever 6 to 8 hours as needed for cough (Patient not taking: Reported on 12/09/2016)  . [DISCONTINUED] Rivaroxaban 15 & 20 MG TBPK Take as directed on package: Start with one 15mg  tablet by mouth twice a day with food. On Day 22, switch to one 20mg  tablet once a day with food.  (Patient not taking: Reported on 12/09/2016)   No facility-administered encounter medications on file as of 12/16/2016.     Activities of Daily Living In your present state of health, do you have any difficulty performing the following activities: 12/16/2016  Hearing? Y  Vision? N  Difficulty concentrating or making decisions? Y  Walking or climbing stairs? N  Dressing or bathing? N  Doing errands, shopping? N  Preparing Food and eating ? N  Using the Toilet? N  In the past six months, have you accidently leaked urine? N  Do you have problems with loss of bowel control? N  Managing your Medications? N  Managing your Finances? N  Housekeeping or managing your Housekeeping? N  Some recent data might be hidden    Patient Care Team: Jerrol Banana., MD as PCP - General (Family Medicine) Lloyd Huger, MD as Consulting Physician (Oncology)   Assessment:     Exercise Activities and Dietary recommendations Current Exercise Habits: The patient does not participate in regular exercise at present (active, nothing structured), Exercise limited by: None identified  Goals    . Increase water intake  Recommend increasing water intake to 4 glasses of water a day.       Fall Risk Fall Risk  12/16/2016 12/02/2015 08/04/2015  Falls in the past year? No No No   Depression Screen PHQ 2/9 Scores 12/16/2016 12/16/2016 12/02/2015 08/04/2015  PHQ - 2 Score 0 0 1 0  PHQ- 9 Score 1 - - -    Cognitive Function- Pt declined screening today. Pt states he has already completed a memory test at [facility].        Immunization History  Administered Date(s) Administered  . Hepatitis A, Adult 11/25/2015  . Influenza, High Dose Seasonal PF 02/12/2016  . Pneumococcal Conjugate-13 02/12/2016  . Td 09/15/2012  . Zoster 05/23/2009   Screening Tests Health Maintenance  Topic Date Due  . INFLUENZA VACCINE  12/15/2016  . PNA vac Low Risk Adult (2 of 2 - PPSV23) 02/11/2017  . TETANUS/TDAP   09/16/2022      Plan:  I have personally reviewed and addressed the Medicare Annual Wellness questionnaire and have noted the following in the patient's chart:  A. Medical and social history B. Use of alcohol, tobacco or illicit drugs  C. Current medications and supplements D. Functional ability and status E.  Nutritional status F.  Physical activity G. Advance directives H. List of other physicians I.  Hospitalizations, surgeries, and ER visits in previous 12 months J.  [place] such as hearing and vision if needed, cognitive and depression L. Referrals and appointments - none  In addition, I have reviewed and discussed with patient certain preventive protocols, quality metrics, and best practice recommendations. A written personalized care plan for preventive services as well as general preventive health recommendations were provided to patient.  See attached scanned questionnaire for additional information.   Signed,  Fabio Neighbors, LPN Nurse Health Advisor   MD Recommendations: None.

## 2016-12-23 ENCOUNTER — Other Ambulatory Visit: Payer: Self-pay | Admitting: Family Medicine

## 2017-01-05 ENCOUNTER — Ambulatory Visit (INDEPENDENT_AMBULATORY_CARE_PROVIDER_SITE_OTHER): Payer: PPO | Admitting: Family Medicine

## 2017-01-05 VITALS — BP 110/70 | HR 60 | Temp 97.9°F | Resp 14 | Wt 169.0 lb

## 2017-01-05 DIAGNOSIS — I10 Essential (primary) hypertension: Secondary | ICD-10-CM | POA: Diagnosis not present

## 2017-01-05 DIAGNOSIS — Z23 Encounter for immunization: Secondary | ICD-10-CM

## 2017-01-05 DIAGNOSIS — E7800 Pure hypercholesterolemia, unspecified: Secondary | ICD-10-CM

## 2017-01-05 DIAGNOSIS — H16139 Photokeratitis, unspecified eye: Secondary | ICD-10-CM

## 2017-01-05 NOTE — Progress Notes (Signed)
Matthew Clayton  MRN: [id number] DOB: Mar 09, 1927  Subjective:  HPI   Patient is here for his annual exam.  Immunization History  Administered Date(s) Administered  . Hepatitis A, Adult 11/25/2015  . Influenza, High Dose Seasonal PF 02/12/2016  . Pneumococcal Conjugate-13 02/12/2016  . Td 09/15/2012  . Zoster 05/23/2009     Patient Active Problem List   Diagnosis Date Noted  . DVT (deep venous thrombosis) ([place]) 06/07/2016  . Fatigue 12/02/2015  . Myalgia 12/02/2015  . Abnormal chest x-ray 11/25/2015  . Anal fissure 11/25/2015  . Decrease in the ability to hear 11/25/2015  . Clergyman's knee 11/25/2015  . Memory loss 11/25/2015  . Anemia 08/04/2015  . Essential hypertension 08/04/2015  . Allergic rhinitis 08/04/2015  . Acid reflux 08/04/2015  . Hyperlipidemia 07/28/2015  . Malaise and fatigue 11/12/2009  . LBP (low back pain) 10/28/2009  . Inflammation of eyelid 04/01/2009  . Dysfunction of eustachian tube 03/05/2008  . Pure hypercholesterolemia 07/05/2007  . Arthropathy of pelvic region and thigh 11/22/2006    Past Medical History:  Diagnosis Date  . DVT (deep venous thrombosis) (HCC)    left leg  . Hyperlipidemia     Social History   Social History  . Marital status: Married    Spouse name: Gwinda Passe  . Number of children: 4  . Years of education: bachelors   Occupational History  . Retired    Social History Main Topics  . Smoking status: Former Smoker    Packs/day: 1.50    Years: 20.00    Types: Cigarettes    Quit date: 05/16/1960  . Smokeless tobacco: Never Used     Comment: quit in 1968  . Alcohol use No  . Drug use: No  . Sexual activity: Not on file   Other Topics Concern  . Not on file   Social History Narrative  . No narrative on file    Outpatient Encounter Prescriptions as of 01/05/2017  Medication Sig  . aspirin EC 81 MG tablet Take 81 mg by mouth daily.  . cetirizine (ZYRTEC) 10 MG tablet Take 1 tablet (10 mg total) by mouth  daily.  . simvastatin (ZOCOR) 40 MG tablet TAKE 1 TABLET (40 MG TOTAL) BY MOUTH DAILY.   No facility-administered encounter medications on file as of 01/05/2017.     No Known Allergies  Review of Systems  Constitutional: Negative for chills, diaphoresis, fever, malaise/fatigue and weight loss.  HENT: Negative for congestion, ear discharge, ear pain, hearing loss, nosebleeds, sinus pain, sore throat and tinnitus.   Eyes: Negative for blurred vision, double vision, photophobia, pain, discharge and redness.  Respiratory: Negative for cough, hemoptysis, sputum production, shortness of breath, wheezing and stridor.   Cardiovascular: Negative for chest pain, palpitations, orthopnea, claudication, leg swelling and PND.  Gastrointestinal: Positive for heartburn (on occassion, treated with OTC meds). Negative for abdominal pain, blood in stool, constipation, diarrhea, melena, nausea and vomiting.  Genitourinary: Negative for dysuria, flank pain, frequency, hematuria and urgency.  Musculoskeletal: Negative for back pain, falls, joint pain, myalgias and neck pain.  Skin: Positive for itching (eczema of the ear, being treated). Negative for rash.  Neurological: Negative for dizziness, tingling, tremors, sensory change, speech change, focal weakness, seizures, loss of consciousness, weakness and headaches.  Endo/Heme/Allergies: Negative for environmental allergies and polydipsia.  Psychiatric/Behavioral: Negative for depression, hallucinations, memory loss, substance abuse and suicidal ideas. The patient is not nervous/anxious and does not have insomnia.     Objective:  BP 110/70 (  BP Location: Right Arm, Patient Position: Sitting, Cuff Size: Normal)   Pulse 60   Temp 97.9 F (36.6 C) (Oral)   Resp 14   Wt 169 lb (76.7 kg)   BMI 27.28 kg/m   Physical Exam  Constitutional: He is oriented to person, place, and time and well-developed, well-nourished, and in no distress.  HENT:  Head: Normocephalic  and atraumatic.  Right Ear: External ear normal.  Left Ear: External ear normal.  Nose: Nose normal.  Mouth/Throat: Oropharynx is clear and moist.  Eyes: Pupils are equal, round, and reactive to light. Conjunctivae and EOM are normal. No scleral icterus.  Neck: No thyromegaly present.  Cardiovascular: Normal rate, regular rhythm, normal heart sounds and intact distal pulses.   Pulmonary/Chest: Effort normal and breath sounds normal.  Abdominal: Soft.  Musculoskeletal:  Left calf--14 7/8 inches Right calf--13 3/4 inches Left thumb OA  Lymphadenopathy:    He has no cervical adenopathy.  Neurological: He is alert and oriented to person, place, and time. Gait normal. GCS score is 15.  Skin: Skin is warm and dry.  Psychiatric: Mood, memory, affect and judgment normal.    Assessment and Plan :  Annual Physical Overall good health. AKs  Refer to Dr Evorn Gong. Provoked DVT Resolved. OA HLD  I have done the exam and reviewed the chart and it is accurate to the best of my knowledge. Development worker, community has been used and  any errors in dictation or transcription are unintentional. Miguel Aschoff M.D. [facility]

## 2017-01-07 DIAGNOSIS — I10 Essential (primary) hypertension: Secondary | ICD-10-CM | POA: Diagnosis not present

## 2017-01-07 DIAGNOSIS — E7800 Pure hypercholesterolemia, unspecified: Secondary | ICD-10-CM | POA: Diagnosis not present

## 2017-01-08 LAB — CBC WITH DIFFERENTIAL/PLATELET
Basophils Absolute: 0 10*3/uL (ref 0.0–0.2)
Basos: 1 %
EOS (ABSOLUTE): 0.4 10*3/uL (ref 0.0–0.4)
Eos: 7 %
Hematocrit: 41 % (ref 37.5–51.0)
Hemoglobin: 14 g/dL (ref 13.0–17.7)
Immature Grans (Abs): 0 10*3/uL (ref 0.0–0.1)
Immature Granulocytes: 0 %
Lymphocytes Absolute: 1.6 10*3/uL (ref 0.7–3.1)
Lymphs: 25 %
MCH: 32 pg (ref 26.6–33.0)
MCHC: 34.1 g/dL (ref 31.5–35.7)
MCV: 94 fL (ref 79–97)
Monocytes Absolute: 0.6 10*3/uL (ref 0.1–0.9)
Monocytes: 9 %
Neutrophils Absolute: 3.8 10*3/uL (ref 1.4–7.0)
Neutrophils: 58 %
Platelets: 204 10*3/uL (ref 150–379)
RBC: 4.38 x10E6/uL (ref 4.14–5.80)
RDW: 14.1 % (ref 12.3–15.4)
WBC: 6.5 10*3/uL (ref 3.4–10.8)

## 2017-01-08 LAB — COMPREHENSIVE METABOLIC PANEL
ALT: 15 IU/L (ref 0–44)
AST: 16 IU/L (ref 0–40)
Albumin/Globulin Ratio: 1.5 (ref 1.2–2.2)
Albumin: 4.1 g/dL (ref 3.5–4.7)
Alkaline Phosphatase: 62 IU/L (ref 39–117)
BUN/Creatinine Ratio: 15 (ref 10–24)
BUN: 17 mg/dL (ref 8–27)
Bilirubin Total: 0.4 mg/dL (ref 0.0–1.2)
CO2: 21 mmol/L (ref 20–29)
Calcium: 9 mg/dL (ref 8.6–10.2)
Chloride: 106 mmol/L (ref 96–106)
Creatinine, Ser: 1.14 mg/dL (ref 0.76–1.27)
GFR calc Af Amer: 66 mL/min/{1.73_m2} (ref >59)
GFR calc non Af Amer: 57 mL/min/{1.73_m2} — ABNORMAL LOW (ref >59)
Globulin, Total: 2.7 g/dL (ref 1.5–4.5)
Glucose: 95 mg/dL (ref 65–99)
Potassium: 4.3 mmol/L (ref 3.5–5.2)
Sodium: 143 mmol/L (ref 134–144)
Total Protein: 6.8 g/dL (ref 6.0–8.5)

## 2017-01-08 LAB — LIPID PANEL WITH LDL/HDL RATIO
Cholesterol, Total: 128 mg/dL (ref 100–199)
HDL: 51 mg/dL (ref >39)
LDL Calculated: 64 mg/dL (ref 0–99)
LDl/HDL Ratio: 1.3 ratio (ref 0.0–3.6)
Triglycerides: 64 mg/dL (ref 0–149)
VLDL Cholesterol Cal: 13 mg/dL (ref 5–40)

## 2017-01-08 LAB — TSH: TSH: 2.11 u[IU]/mL (ref 0.450–4.500)

## 2017-01-10 ENCOUNTER — Telehealth: Payer: Self-pay | Admitting: Family Medicine

## 2017-01-10 NOTE — Telephone Encounter (Signed)
Pt is returning call.  CN#[phone]

## 2017-01-10 NOTE — Telephone Encounter (Signed)
Pt is returning call.  PN#[phone]

## 2017-03-03 ENCOUNTER — Ambulatory Visit (INDEPENDENT_AMBULATORY_CARE_PROVIDER_SITE_OTHER): Payer: PPO | Admitting: Family Medicine

## 2017-03-03 ENCOUNTER — Encounter: Payer: Self-pay | Admitting: Family Medicine

## 2017-03-03 VITALS — BP 104/64 | HR 65 | Temp 97.1°F | Resp 16 | Wt 170.2 lb

## 2017-03-03 DIAGNOSIS — M7022 Olecranon bursitis, left elbow: Secondary | ICD-10-CM | POA: Diagnosis not present

## 2017-03-03 MED ORDER — CEPHALEXIN 500 MG PO CAPS: 500.0000 mg | ORAL_CAPSULE | Freq: Two times a day (BID) | ORAL | 0 refills | Status: DC

## 2017-03-03 NOTE — Progress Notes (Signed)
Subjective:     Patient ID: Matthew Clayton, male   DOB: 03-08-1927, 83 y.o.   MRN: [id number]  HPI  Chief Complaint  Patient presents with  . Elbow Pain    Patient comes in office today with concerns of fluid buildt up around his left elbow for the past 24hrs. Patient denies injury to arm, he states that elbow has a small black dot and is tender/warm to touch.   States he had been working in his yard prior but does not remember a direct injury. There is a small scab @ the tip of his elbow suggesting injury.   Review of Systems     Objective:   Physical Exam  Constitutional: He appears well-developed and well-nourished. No distress.  Musculoskeletal:  Left elbow swelling c/w bursitis. Mild erythema noted but no tenderness or drainage. Fluctuant with mild induration.       Assessment:    1. Olecranon bursitis of left elbow: applied 3" ACE Wrap. Will cover for infection as he is going out of town until the 22 nd. - cephALEXin (KEFLEX) 500 MG capsule; Take 1 capsule (500 mg total) by mouth 2 (two) times daily.  Dispense: 14 capsule; Refill: 0    Plan:    Consider orthopedic evaluation if not improving. He will seek out further assistance if worsening while out of town.

## 2017-03-03 NOTE — Patient Instructions (Addendum)
Leave compression wrap on during the day and remove at bedtime. May also take ibuprofen 600 mg. 2 x day with meals along with the antibiotic.

## 2017-03-09 ENCOUNTER — Ambulatory Visit (INDEPENDENT_AMBULATORY_CARE_PROVIDER_SITE_OTHER): Payer: PPO | Admitting: Family Medicine

## 2017-03-09 ENCOUNTER — Other Ambulatory Visit: Payer: Self-pay | Admitting: Family Medicine

## 2017-03-09 ENCOUNTER — Encounter: Payer: Self-pay | Admitting: Family Medicine

## 2017-03-09 VITALS — BP 110/76 | HR 73 | Temp 97.6°F | Resp 16 | Wt 168.8 lb

## 2017-03-09 DIAGNOSIS — M7022 Olecranon bursitis, left elbow: Secondary | ICD-10-CM | POA: Diagnosis not present

## 2017-03-09 DIAGNOSIS — L03119 Cellulitis of unspecified part of limb: Secondary | ICD-10-CM | POA: Diagnosis not present

## 2017-03-09 NOTE — Progress Notes (Signed)
Subjective:     Patient ID: Matthew Clayton, male   DOB: 06/02/1926, 83 y.o.   MRN: [id number]  HPI  Chief Complaint  Patient presents with  . Elbow Problem    Patient returns back to office today for follow up visit after being seen on 03/03/17. Patient reports that swelling has increased and moved down to his lower arm, patient states that skin is red and warm to the touch. Patient denies any numbness or tingling in his extremities.   States he has complied with antibiotic and has been using the ACE wrap during the day only. No fever or chills.    Review of Systems     Objective:   Physical Exam  Constitutional: He appears well-developed and well-nourished. No distress.  Skin:  Left elbow remains swollen. [place] increased in size and erythema but remains non-tender. Minimal swelling distal to his elbow.       Assessment:    1. Olecranon bursitis of left elbow: r/o septic burstis-referred to orthopedics, Dr. Sabra Heck, today.    Plan:    Per orthopedic.

## 2017-03-09 NOTE — Patient Instructions (Signed)
Do see Dr. Sabra Heck this afternoon.

## 2017-03-10 DIAGNOSIS — L723 Sebaceous cyst: Secondary | ICD-10-CM | POA: Diagnosis not present

## 2017-03-10 DIAGNOSIS — D2272 Melanocytic nevi of left lower limb, including hip: Secondary | ICD-10-CM | POA: Diagnosis not present

## 2017-03-10 DIAGNOSIS — D2261 Melanocytic nevi of right upper limb, including shoulder: Secondary | ICD-10-CM | POA: Diagnosis not present

## 2017-03-10 DIAGNOSIS — L538 Other specified erythematous conditions: Secondary | ICD-10-CM | POA: Diagnosis not present

## 2017-03-10 DIAGNOSIS — L820 Inflamed seborrheic keratosis: Secondary | ICD-10-CM | POA: Diagnosis not present

## 2017-03-10 DIAGNOSIS — D2271 Melanocytic nevi of right lower limb, including hip: Secondary | ICD-10-CM | POA: Diagnosis not present

## 2017-03-10 DIAGNOSIS — R208 Other disturbances of skin sensation: Secondary | ICD-10-CM | POA: Diagnosis not present

## 2017-03-10 DIAGNOSIS — L298 Other pruritus: Secondary | ICD-10-CM | POA: Diagnosis not present

## 2017-03-11 DIAGNOSIS — L03119 Cellulitis of unspecified part of limb: Secondary | ICD-10-CM | POA: Diagnosis not present

## 2017-03-16 DIAGNOSIS — L03119 Cellulitis of unspecified part of limb: Secondary | ICD-10-CM | POA: Diagnosis not present

## 2017-05-03 NOTE — Telephone Encounter (Signed)
Visit completed.

## 2017-05-05 ENCOUNTER — Encounter (INDEPENDENT_AMBULATORY_CARE_PROVIDER_SITE_OTHER): Payer: Self-pay | Admitting: Vascular Surgery

## 2017-05-05 ENCOUNTER — Ambulatory Visit (INDEPENDENT_AMBULATORY_CARE_PROVIDER_SITE_OTHER): Payer: PPO | Admitting: Vascular Surgery

## 2017-05-05 VITALS — BP 114/63 | HR 45 | Resp 17 | Ht 65.0 in | Wt 171.0 lb

## 2017-05-05 DIAGNOSIS — I10 Essential (primary) hypertension: Secondary | ICD-10-CM

## 2017-05-05 DIAGNOSIS — I82412 Acute embolism and thrombosis of left femoral vein: Secondary | ICD-10-CM | POA: Diagnosis not present

## 2017-05-05 DIAGNOSIS — E782 Mixed hyperlipidemia: Secondary | ICD-10-CM | POA: Diagnosis not present

## 2017-05-05 NOTE — Progress Notes (Signed)
MRN : [id number]  Matthew Clayton is a 83 y.o. (Jun 25, 1926) male who presents with chief complaint of  Chief Complaint  Patient presents with  . Follow-up    6 month no studies  .  History of Present Illness:  The patient presents to the office for evaluation of DVT.  DVT was identified at [facility] by Duplex ultrasound.  The initial symptoms were pain and swelling in the lower extremity.  The patient notes the leg continues to be very painful with dependency and swells quite a bite.  Symptoms are much better with elevation.  The patient notes minimal edema in the morning which steadily worsens throughout the day.    The patient has not been using compression therapy at this point.  No SOB or pleuritic chest pains.  No cough or hemoptysis.  No blood per rectum or blood in any sputum.  No excessive bruising per the patient.      Current Meds  Medication Sig  . aspirin EC 81 MG tablet Take 81 mg by mouth daily.  . cetirizine (ZYRTEC) 10 MG tablet Take 1 tablet (10 mg total) by mouth daily.  . meloxicam (MOBIC) 15 MG tablet Take 15 mg by mouth daily.  . simvastatin (ZOCOR) 40 MG tablet TAKE 1 TABLET (40 MG TOTAL) BY MOUTH DAILY.  Marland Kitchen sulfamethoxazole-trimethoprim (BACTRIM DS,SEPTRA DS) 800-160 MG tablet TAKE 1 TABLET(S) TWICE A DAY BY ORAL ROUTE.      Past Medical History:  Diagnosis Date  . DVT (deep venous thrombosis) (HCC)    left leg  . Hyperlipidemia     Past Surgical History:  Procedure Laterality Date  . APPENDECTOMY    . MOLE REMOVAL    . TONSILLECTOMY AND ADENOIDECTOMY    . TOTAL HIP ARTHROPLASTY Bilateral     Social History Social History   Tobacco Use  . Smoking status: Former Smoker    Packs/day: 1.50    Years: 20.00    Pack years: 30.00    Types: Cigarettes    Last attempt to quit: 05/16/1960    Years since quitting: 57.0  . Smokeless tobacco: Never Used  . Tobacco comment: quit in 1968  Substance Use Topics  . Alcohol use: No    Alcohol/week: 0.0 oz   . Drug use: No    Family History Family History  Problem Relation Age of Onset  . Breast cancer Mother   . Cancer Mother        eye cancer  . Heart disease Father   . Breast cancer Sister   . Stroke Brother   . Heart attack Brother   . Breast cancer Sister   . Aneurysm Sister     No Known Allergies   REVIEW OF SYSTEMS (Negative unless checked)  Constitutional: [] Weight loss  [] Fever  [] Chills Cardiac: [] Chest pain   [] Chest pressure   [] Palpitations   [] Shortness of breath when laying flat   [] Shortness of breath with exertion. Vascular:  [] Pain in legs with walking   [] Pain in legs at rest  [x] History of DVT   [] Phlebitis   [] Swelling in legs   [] Varicose veins   [] Non-healing ulcers Pulmonary:   [] Uses home oxygen   [] Productive cough   [] Hemoptysis   [] Wheeze  [] COPD   [] Asthma Neurologic:  [] Dizziness   [] Seizures   [] History of stroke   [] History of TIA  [] Aphasia   [] Vissual changes   [] Weakness or numbness in arm   [] Weakness or numbness in leg Musculoskeletal:   []   Joint swelling   [] Joint pain   [] Low back pain Hematologic:  [] Easy bruising  [] Easy bleeding   [] Hypercoagulable state   [] Anemic Gastrointestinal:  [] Diarrhea   [] Vomiting  [] Gastroesophageal reflux/heartburn   [] Difficulty swallowing. Genitourinary:  [] Chronic kidney disease   [] Difficult urination  [] Frequent urination   [] Blood in urine Skin:  [] Rashes   [] Ulcers  Psychological:  [] History of anxiety   []  History of major depression.  Physical Examination  Vitals:   05/05/17 1444  BP: 114/63  Pulse: (!) 45  Resp: 17  Weight: 77.6 kg (171 lb)  Height: 5\' 5"  (1.651 m)   Body mass index is 28.46 kg/m. Gen: WD/WN, NAD Head: [place]/AT, No temporalis wasting.  Ear/Nose/Throat: Hearing grossly intact, nares w/o erythema or drainage Eyes: PER, EOMI, sclera nonicteric.  Neck: Supple, no large masses.   Pulmonary:  Good air movement, no audible wheezing bilaterally, no use of accessory muscles.    Cardiac: RRR, no JVD Vascular: scattered varicosities present bilaterally.  Mild venous stasis changes to the legs bilaterally.  2+ soft pitting edema left leg larger than the right Vessel Right Left  Radial Palpable Palpable  Gastrointestinal: Non-distended. No guarding/no peritoneal signs.  Musculoskeletal: M/S 5/5 throughout.  No deformity or atrophy.  Neurologic: CN 2-12 intact. Symmetrical.  Speech is fluent. Motor exam as listed above. Psychiatric: Judgment intact, Mood & affect appropriate for pt's clinical situation. Dermatologic: venous rashes no ulcers noted.  No changes consistent with cellulitis. Lymph : No lichenification or skin changes of chronic lymphedema.  CBC Lab Results  Component Value Date   WBC 6.5 01/07/2017   HGB 14.0 01/07/2017   HCT 41.0 01/07/2017   MCV 94 01/07/2017   PLT 204 01/07/2017    BMET    Component Value Date/Time   NA 143 01/07/2017 1104   K 4.3 01/07/2017 1104   CL 106 01/07/2017 1104   CO2 21 01/07/2017 1104   GLUCOSE 95 01/07/2017 1104   GLUCOSE 112 (H) 05/17/2016 1249   BUN 17 01/07/2017 1104   CREATININE 1.14 01/07/2017 1104   CALCIUM 9.0 01/07/2017 1104   GFRNONAA 57 (L) 01/07/2017 1104   GFRAA 66 01/07/2017 1104   CrCl cannot be calculated (Patient's most recent lab result is older than the maximum 21 days allowed.).  COAG No results found for: INR, PROTIME  Radiology No results found.  Assessment/Plan 1. Acute deep vein thrombosis (DVT) of femoral vein of left lower extremity (HCC) Recommend:   No surgery or intervention at this point in time.  IVC filter is not indicated at present.  Elevation was stressed, use of a recliner was discussed.  I have had a long discussion with the patient regarding DVT and post phlebitic changes such as swelling and why it  causes symptoms such as pain.  The patient will wear graduated compression stockings class 1 (20-30 mmHg), beginning after three full days of anticoagulation, on a  daily basis a prescription was given. The patient will  beginning wearing the stockings first thing in the morning and removing them in the evening. The patient is instructed specifically not to sleep in the stockings.  In addition, behavioral modification including elevation during the day and avoidance of prolonged dependency will be initiated.    The patient will continue anticoagulation for now as there have not been any problems or complications at this point.    2. Essential hypertension Continue antihypertensive medications as already ordered, these medications have been reviewed and there are no changes at  this time.   3. Mixed hyperlipidemia Continue statin as ordered and reviewed, no changes at this time   Hortencia Pilar, MD  05/05/2017 3:08 PM

## 2017-05-08 ENCOUNTER — Encounter (INDEPENDENT_AMBULATORY_CARE_PROVIDER_SITE_OTHER): Payer: Self-pay | Admitting: Vascular Surgery

## 2017-05-12 ENCOUNTER — Ambulatory Visit (INDEPENDENT_AMBULATORY_CARE_PROVIDER_SITE_OTHER): Payer: PPO | Admitting: Vascular Surgery

## 2017-08-02 ENCOUNTER — Telehealth (INDEPENDENT_AMBULATORY_CARE_PROVIDER_SITE_OTHER): Payer: Self-pay

## 2017-08-02 ENCOUNTER — Telehealth (INDEPENDENT_AMBULATORY_CARE_PROVIDER_SITE_OTHER): Payer: Self-pay | Admitting: Vascular Surgery

## 2017-08-02 NOTE — Telephone Encounter (Signed)
Patient has had a prior DVT in the left leg and he just wants to know what to do when he is going to be traveling?

## 2017-08-02 NOTE — Telephone Encounter (Signed)
On a daily basis, the patient should be engaging in the following:  The patient was encouraged to wear graduated compression stockings (20-30 mmHg) on a daily basis. The patient was instructed to begin wearing the stockings first thing in the morning and removing them in the evening. The patient was instructed specifically not to sleep in the stockings. Elevation during the day and evening.  When traveling and sitting for long periods of time whether via car or plane the patient should be wearing graduated compression stockings (20-30 mmHg) and getting up and walking around multiple times.

## 2017-08-02 NOTE — Telephone Encounter (Signed)
Tried calling the patient back 3 times, the line was busy and there was no way to leave a message.

## 2017-08-02 NOTE — Telephone Encounter (Signed)
Called the patient back and gave him the instructions from [place], [place]. She stated for the patient to wear the graduated compression stocking (20-12mmHg), the patient admitted that he had a lower grade, the 15-20 and that he would get the correct ones and continue to take his Aspirin daily since this is the only blood thinner that he's on. He was pleased that all of his questions were answered.

## 2017-10-12 ENCOUNTER — Ambulatory Visit (INDEPENDENT_AMBULATORY_CARE_PROVIDER_SITE_OTHER): Payer: PPO | Admitting: Family Medicine

## 2017-10-12 ENCOUNTER — Other Ambulatory Visit: Payer: Self-pay | Admitting: Family Medicine

## 2017-10-12 ENCOUNTER — Encounter: Payer: Self-pay | Admitting: Family Medicine

## 2017-10-12 VITALS — BP 110/80 | HR 64 | Temp 97.5°F | Resp 16 | Wt 168.0 lb

## 2017-10-12 DIAGNOSIS — J301 Allergic rhinitis due to pollen: Secondary | ICD-10-CM

## 2017-10-12 MED ORDER — FLUTICASONE PROPIONATE 50 MCG/ACT NA SUSP: 2.0000 | Freq: Every day | NASAL | 1 refills | Status: DC

## 2017-10-12 NOTE — Progress Notes (Signed)
  Subjective:     Patient ID: Matthew Clayton, male   DOB: 1926-06-04, 84 y.o.   MRN: [id number] Chief Complaint  Patient presents with  . Cough    Patient comes in office today with complaint of non productive cough for the past three days or more. Patient states that he has post nasal drip in PM, he denies congestion, shortness of breath or wheezing. Patient has been taking otc Cetrizine for relief.    HPI States he does spend a lot of time outside. He does not feel ill today. Reports drainage is clear to white. Planning on [place] in [place] soon.  Review of Systems     Objective:   Physical Exam  Constitutional: He appears well-developed and well-nourished. No distress.  Ears: T.M's intact without inflammation Throat: no tonsillar enlargement or exudate Neck: no cervical adenopathy Lungs: clear     Assessment:    1. Seasonal allergic rhinitis due to pollen: start fluticasone nasal spray    Plan:    Continue cetirizine. Call if not improving or sees purulent drainage.

## 2017-10-12 NOTE — Patient Instructions (Addendum)
Give the steroid nasal spray a few day to work fully. Continue cetirizine.

## 2017-10-18 ENCOUNTER — Other Ambulatory Visit: Payer: Self-pay | Admitting: Family Medicine

## 2017-10-18 MED ORDER — FLUTICASONE PROPIONATE 50 MCG/ACT NA SUSP: NASAL | 1 refills | Status: DC

## 2017-10-18 NOTE — Telephone Encounter (Signed)
rx re-sent electronically

## 2017-11-29 ENCOUNTER — Telehealth: Payer: Self-pay

## 2017-11-29 NOTE — Telephone Encounter (Signed)
Called pt to schedule AWV and pt declined scheduling the AWV for this year. FYI to PCP! -MM

## 2018-01-03 DIAGNOSIS — H25813 Combined forms of age-related cataract, bilateral: Secondary | ICD-10-CM | POA: Diagnosis not present

## 2018-01-05 IMAGING — CT CT ANGIO CHEST
1 of 6 series · 19 of 36 positions shown · IV contrast (APPLIED)
Comparison: None.

CLINICAL DATA: Extensive left lower extremity DVT.

EXAM:
CT ANGIOGRAPHY CHEST WITH CONTRAST
TECHNIQUE: Multidetector CT imaging of the chest was performed using the
standard protocol during bolus administration of intravenous
contrast. Multiplanar CT image reconstructions and MIPs were
obtained to evaluate the vascular anatomy.
CONTRAST:  75 mL Isovue 370 IV

[Series 5: thins · axial · 0.75mm/px · z∈[-702,-382]mm · 19 of 356 slices shown]
[im 18/356  lung]
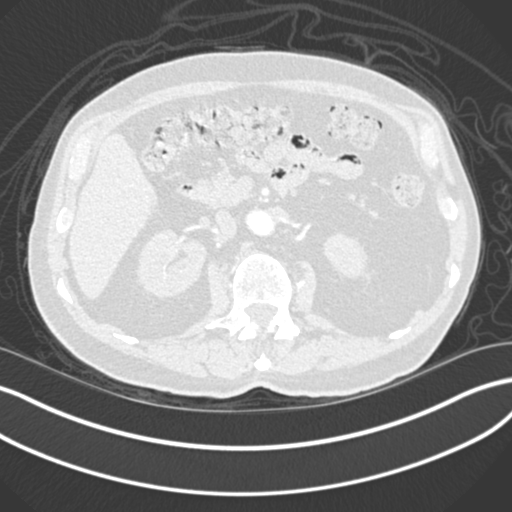
[im 36/356  mediastinal]
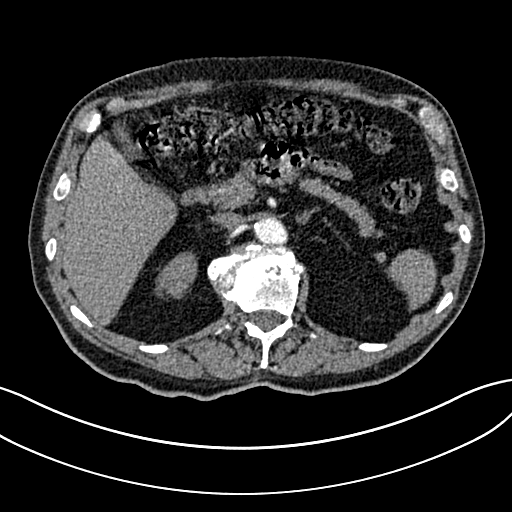
[im 54/356  lung]
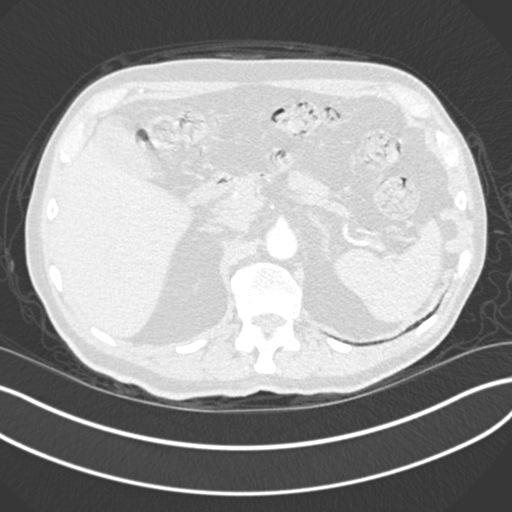
[im 72/356  mediastinal]
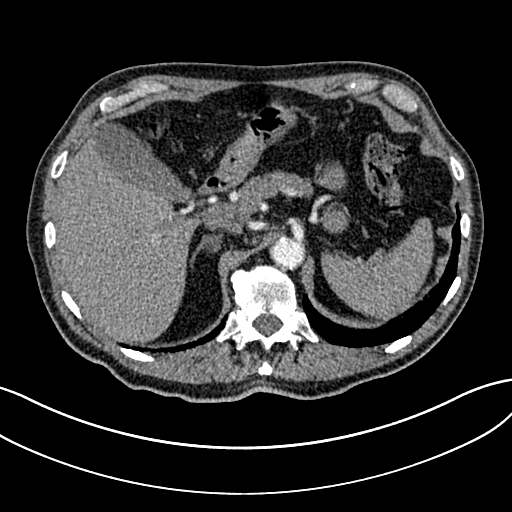
[im 89/356  lung]
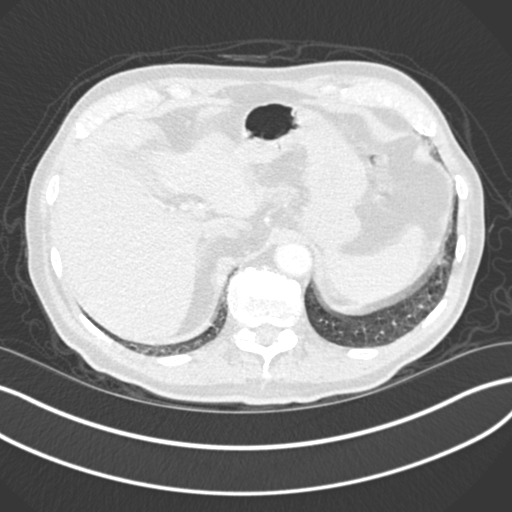
[im 107/356  mediastinal]
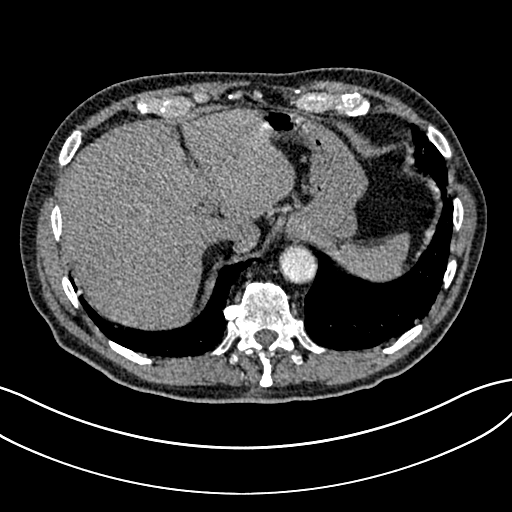
[im 125/356  lung]
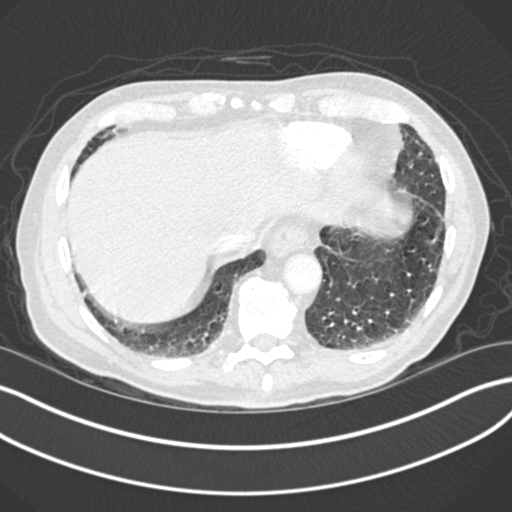
[im 143/356  mediastinal]
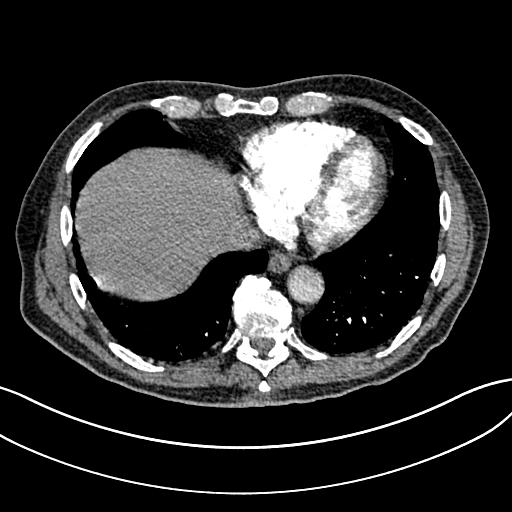
[im 160/356  lung]
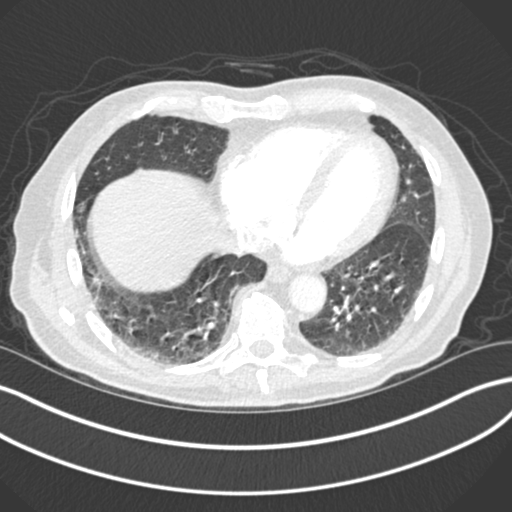
[im 178/356  mediastinal]
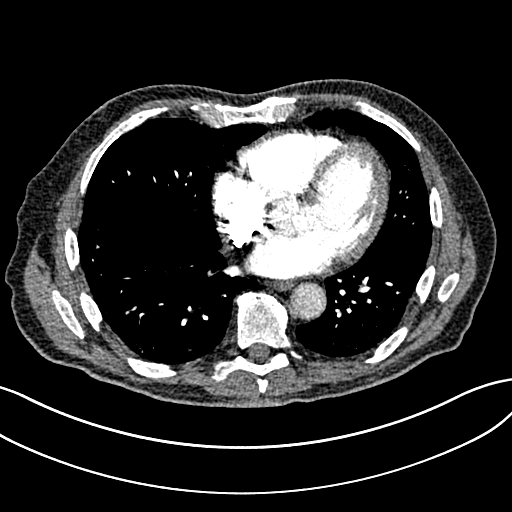
[im 196/356  lung]
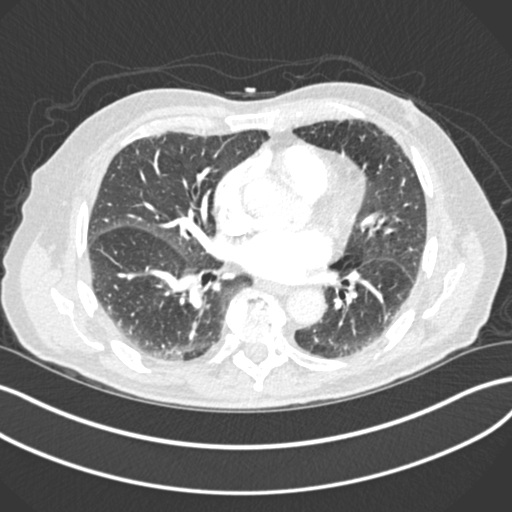
[im 214/356  mediastinal]
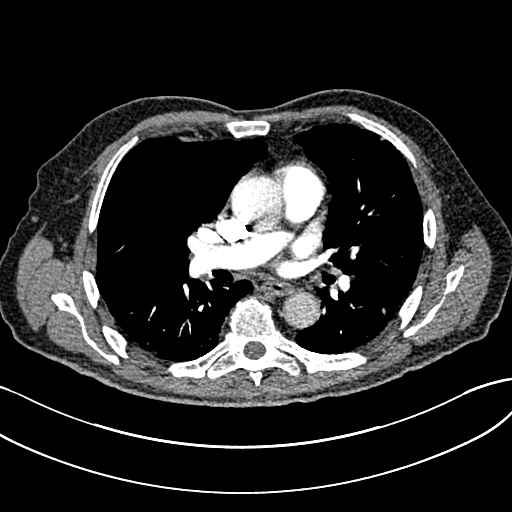
[im 231/356  lung]
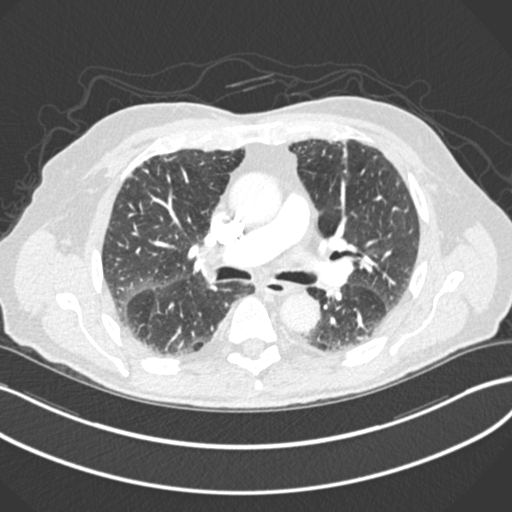
[im 249/356  mediastinal]
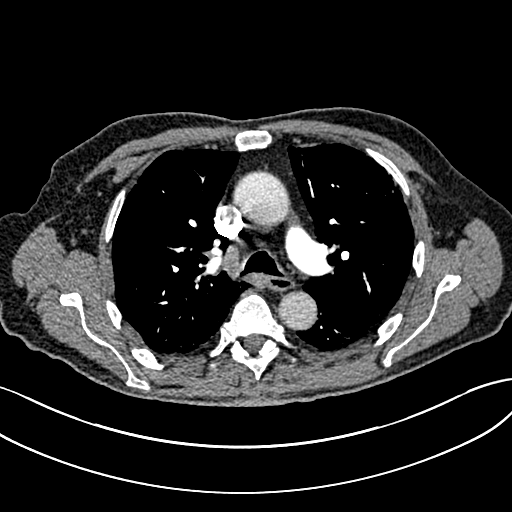
[im 267/356  lung]
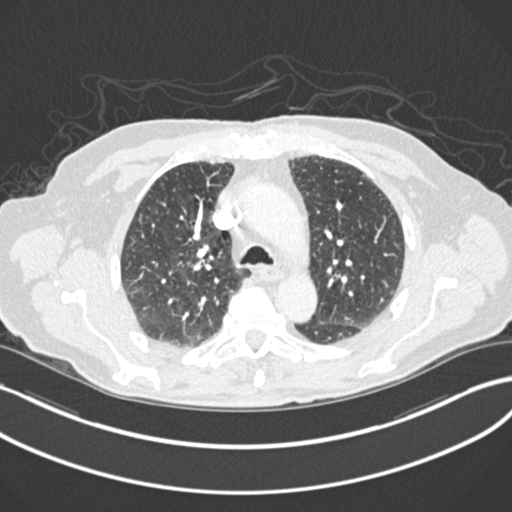
[im 285/356  mediastinal]
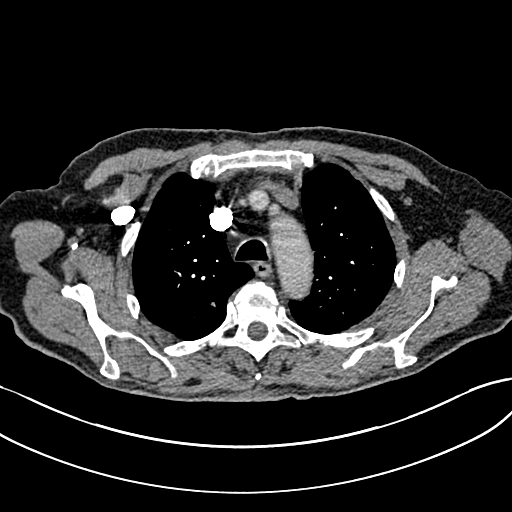
[im 302/356  lung]
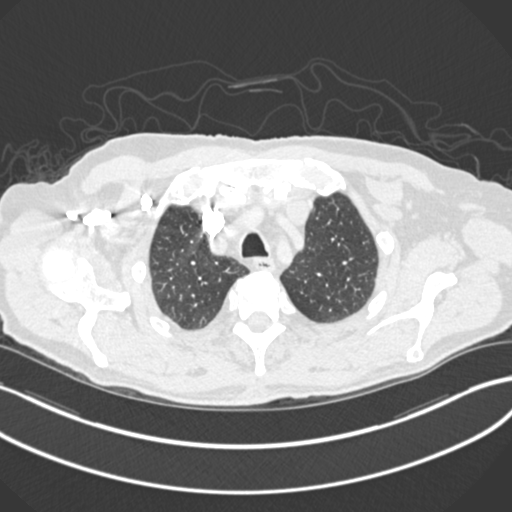
[im 320/356  mediastinal]
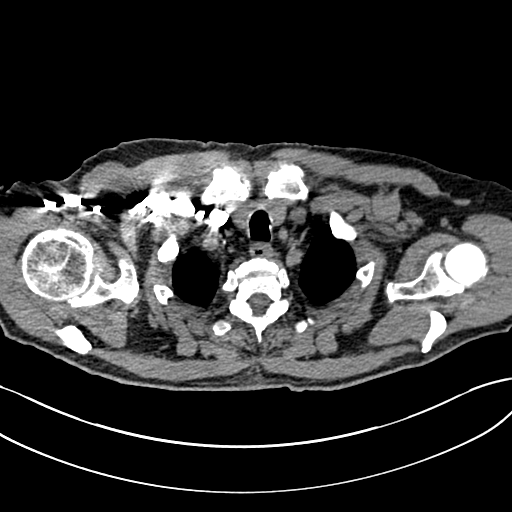
[im 338/356  lung]
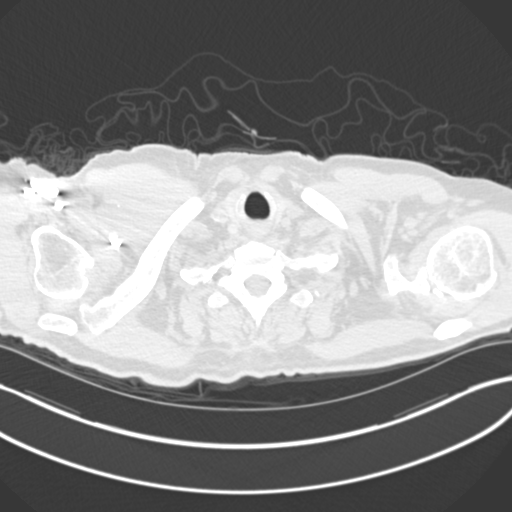

[19 of 36 positions shown; findings below may reference images not displayed]

FINDINGS: Cardiovascular: The pulmonary arteries are adequately opacified.
There may be very subtle nonocclusive mural thrombus in right lower
lobe pulmonary artery branches. Based on appearance, this may be
reflective of prior pulmonary embolism and does not have the
appearance of acute thrombus. The heart size is normal. No evidence
of pericardial effusion.

Mediastinum/Nodes: Mildly prominent right-sided hilar lymph node
tissue without overt enlargement. No evidence of mediastinal or
axillary lymphadenopathy. No mediastinal masses.

Lungs/Pleura: There is no evidence of pulmonary edema,
consolidation, pneumothorax, nodule or pleural fluid.

Upper Abdomen: No acute abnormality.

Musculoskeletal: No chest wall abnormality. No acute or significant
osseous findings.

Review of the MIP images confirms the above findings.
IMPRESSION: Potentially very subtle nonocclusive mural thrombus in right lower
lobe pulmonary artery branches. This does not have the appearance of
acute pulmonary embolism and may reflect prior PE. Mildly prominent
right hilar lymph node tissue identified without obvious pathologic
lymphadenopathy.

## 2018-01-26 ENCOUNTER — Other Ambulatory Visit: Payer: Self-pay | Admitting: Family Medicine

## 2018-01-26 NOTE — Telephone Encounter (Signed)
[facility] faxed a refill request for the following medication. Thanks CC  simvastatin (ZOCOR) 40 MG tablet

## 2018-01-27 ENCOUNTER — Other Ambulatory Visit: Payer: Self-pay | Admitting: Family Medicine

## 2018-01-28 MED ORDER — SIMVASTATIN 40 MG PO TABS: 40.0000 mg | ORAL_TABLET | Freq: Every day | ORAL | 0 refills | Status: DC

## 2018-03-28 ENCOUNTER — Encounter: Payer: Self-pay | Admitting: Family Medicine

## 2018-03-28 ENCOUNTER — Ambulatory Visit (INDEPENDENT_AMBULATORY_CARE_PROVIDER_SITE_OTHER): Payer: PPO | Admitting: Family Medicine

## 2018-03-28 VITALS — BP 128/64 | HR 42 | Temp 97.9°F | Resp 16 | Wt 168.0 lb

## 2018-03-28 DIAGNOSIS — E7800 Pure hypercholesterolemia, unspecified: Secondary | ICD-10-CM | POA: Diagnosis not present

## 2018-03-28 DIAGNOSIS — Z23 Encounter for immunization: Secondary | ICD-10-CM

## 2018-03-28 DIAGNOSIS — I441 Atrioventricular block, second degree: Secondary | ICD-10-CM

## 2018-03-28 DIAGNOSIS — I10 Essential (primary) hypertension: Secondary | ICD-10-CM

## 2018-03-28 NOTE — Progress Notes (Signed)
Patient: Matthew Clayton Male    DOB: Dec 25, 1926   84 y.o.   MRN: [id number] Visit Date: 03/28/2018  Today's Provider: [name], MD   Chief Complaint  Patient presents with  . Hyperlipidemia   Subjective:    HPI  Lipid/Cholesterol, Follow-up:   Last seen for this1 years ago.  Management changes since that visit include no changes. . Last Lipid Panel:    Component Value Date/Time   CHOL 128 01/07/2017 1104   TRIG 64 01/07/2017 1104   HDL 51 01/07/2017 1104   CHOLHDL 3.2 12/02/2015 1211   LDLCALC 64 01/07/2017 1104    He reports good compliance with treatment. He is not having side effects.  Current symptoms include none and have been stable. Weight trend: stable Prior visit with dietician: no Current diet: well balanced Current exercise: no regular exercise, but he does stay active.   Wt Readings from Last 3 Encounters:  03/28/18 168 lb (76.2 kg)  10/12/17 168 lb (76.2 kg)  05/05/17 171 lb (77.6 kg)  Overall pt feels well other than less energy than in the past.  No Known Allergies   Current Outpatient Medications:  .  aspirin EC 81 MG tablet, Take 81 mg by mouth daily., Disp: , Rfl:  .  cetirizine (ZYRTEC) 10 MG tablet, Take 1 tablet (10 mg total) by mouth daily., Disp: 30 tablet, Rfl: 11 .  fluticasone (FLONASE) 50 MCG/ACT nasal spray, SPRAY 2 SPRAYS INTO EACH NOSTRIL EVERY DAY, Disp: 48 g, Rfl: 1 .  Multiple Vitamins-Minerals (CENTRUM SILVER PO), Take by mouth., Disp: , Rfl:  .  simvastatin (ZOCOR) 40 MG tablet, Take 1 tablet (40 mg total) by mouth daily. Needs appt for further Rx., Disp: 90 tablet, Rfl: 0  Review of Systems  Constitutional: Negative.   HENT: Negative.   Eyes: Negative.   Respiratory: Negative.   Gastrointestinal: Negative.   Endocrine: Negative.   Musculoskeletal: Negative.   Skin: Negative.   Allergic/Immunologic: Negative.   Neurological: Negative.   Psychiatric/Behavioral: Negative.     Social History    Tobacco Use  . Smoking status: Former Smoker    Packs/day: 1.50    Years: 20.00    Pack years: 30.00    Types: Cigarettes    Last attempt to quit: 05/16/1960    Years since quitting: 57.9  . Smokeless tobacco: Never Used  . Tobacco comment: quit in 1968  Substance Use Topics  . Alcohol use: No    Alcohol/week: 0.0 standard drinks   Objective:   BP 128/64 (BP Location: Left Arm, Patient Position: Sitting, Cuff Size: Normal)   Pulse (!) 42   Temp 97.9 F (36.6 C)   Resp 16   Wt 168 lb (76.2 kg)   SpO2 97%   BMI 27.96 kg/m  Vitals:   03/28/18 1520  BP: 128/64  Pulse: (!) 42  Resp: 16  Temp: 97.9 F (36.6 C)  SpO2: 97%  Weight: 168 lb (76.2 kg)     Physical Exam  Constitutional: He is oriented to person, place, and time. He appears well-developed and well-nourished.  HENT:  Head: Normocephalic and atraumatic.  Right Ear: External ear normal.  Left Ear: External ear normal.  Nose: Nose normal.  Eyes: Conjunctivae are normal. No scleral icterus.  Neck: No thyromegaly present.  Cardiovascular: Regular rhythm and normal heart sounds.  Bradycardic,asymptomatic.  Pulmonary/Chest: Effort normal and breath sounds normal.  Abdominal: Soft.  Musculoskeletal: He exhibits no edema.  Neurological:  He is alert and oriented to person, place, and time.  Skin: Skin is warm and dry.  Psychiatric: He has a normal mood and affect. His behavior is normal. Judgment and thought content normal.        Assessment & Plan:     1. Pure hypercholesterolemia  - EKG 12-Lead - Lipid panel  2. Essential hypertension  - Comprehensive metabolic panel - CBC with Differential/Platelet - TSH  3. Need for influenza vaccination  - Flu vaccine HIGH DOSE PF (Fluzone High dose)  4. Second degree heart block by electrocardiogram (ECG) Hard to see P waves but appears to be completely asymptomatic.More than 50% of 25 minute visit spent in counseling and coordination of care. -  Ambulatory referral to Cardiology  I have done the exam and reviewed the chart and it is accurate to the best of my knowledge. Development worker, community has been used and  any errors in dictation or transcription are unintentional.      [name], MD  [facility]

## 2018-03-29 LAB — COMPREHENSIVE METABOLIC PANEL
ALT: 18 IU/L (ref 0–44)
AST: 22 IU/L (ref 0–40)
Albumin/Globulin Ratio: 1.7 (ref 1.2–2.2)
Albumin: 4.5 g/dL (ref 3.5–4.7)
Alkaline Phosphatase: 70 IU/L (ref 39–117)
BUN/Creatinine Ratio: 16 (ref 10–24)
BUN: 21 mg/dL (ref 8–27)
Bilirubin Total: 0.5 mg/dL (ref 0.0–1.2)
CO2: 22 mmol/L (ref 20–29)
Calcium: 9.9 mg/dL (ref 8.6–10.2)
Chloride: 103 mmol/L (ref 96–106)
Creatinine, Ser: 1.32 mg/dL — ABNORMAL HIGH (ref 0.76–1.27)
GFR calc Af Amer: 55 mL/min/{1.73_m2} — ABNORMAL LOW (ref >59)
GFR calc non Af Amer: 47 mL/min/{1.73_m2} — ABNORMAL LOW (ref >59)
Globulin, Total: 2.6 g/dL (ref 1.5–4.5)
Glucose: 91 mg/dL (ref 65–99)
Potassium: 4.4 mmol/L (ref 3.5–5.2)
Sodium: 141 mmol/L (ref 134–144)
Total Protein: 7.1 g/dL (ref 6.0–8.5)

## 2018-03-29 LAB — CBC WITH DIFFERENTIAL/PLATELET
Basophils Absolute: 0.1 10*3/uL (ref 0.0–0.2)
Basos: 1 %
EOS (ABSOLUTE): 0.4 10*3/uL (ref 0.0–0.4)
Eos: 4 %
Hematocrit: 41.4 % (ref 37.5–51.0)
Hemoglobin: 14.2 g/dL (ref 13.0–17.7)
Immature Grans (Abs): 0 10*3/uL (ref 0.0–0.1)
Immature Granulocytes: 0 %
Lymphocytes Absolute: 1.5 10*3/uL (ref 0.7–3.1)
Lymphs: 18 %
MCH: 32.9 pg (ref 26.6–33.0)
MCHC: 34.3 g/dL (ref 31.5–35.7)
MCV: 96 fL (ref 79–97)
Monocytes Absolute: 0.6 10*3/uL (ref 0.1–0.9)
Monocytes: 8 %
Neutrophils Absolute: 5.6 10*3/uL (ref 1.4–7.0)
Neutrophils: 69 %
Platelets: 220 10*3/uL (ref 150–450)
RBC: 4.31 x10E6/uL (ref 4.14–5.80)
RDW: 12.7 % (ref 12.3–15.4)
WBC: 8.2 10*3/uL (ref 3.4–10.8)

## 2018-03-29 LAB — LIPID PANEL
Chol/HDL Ratio: 3 ratio (ref 0.0–5.0)
Cholesterol, Total: 153 mg/dL (ref 100–199)
HDL: 51 mg/dL (ref >39)
LDL Calculated: 79 mg/dL (ref 0–99)
Triglycerides: 113 mg/dL (ref 0–149)
VLDL Cholesterol Cal: 23 mg/dL (ref 5–40)

## 2018-03-29 LAB — TSH: TSH: 1.85 u[IU]/mL (ref 0.450–4.500)

## 2018-03-30 DIAGNOSIS — R001 Bradycardia, unspecified: Secondary | ICD-10-CM | POA: Diagnosis not present

## 2018-03-30 DIAGNOSIS — R0602 Shortness of breath: Secondary | ICD-10-CM | POA: Diagnosis not present

## 2018-04-17 DIAGNOSIS — R0602 Shortness of breath: Secondary | ICD-10-CM | POA: Diagnosis not present

## 2018-04-21 ENCOUNTER — Telehealth: Payer: Self-pay | Admitting: Family Medicine

## 2018-04-21 NOTE — Telephone Encounter (Signed)
Not sure but maybe he needs the pneumovax and the 2 new shingles vaccines.  Let me know

## 2018-04-21 NOTE — Telephone Encounter (Signed)
I agree: Pneumovax 23 and Shingrix. However he has to wait 4 weeks after his last flu shot for any other immunizations

## 2018-04-21 NOTE — Telephone Encounter (Signed)
Patient wants to know what vaccines he is due for

## 2018-04-21 NOTE — Telephone Encounter (Signed)
Called pt back and left vm on home phone and stated that he needed the pneumonia 23 vaccine and the 2 shingrix vaccines.  He needs to wait 4 weeks since last flu vaccine to get other vaccines.  dbs

## 2018-04-25 ENCOUNTER — Other Ambulatory Visit: Payer: Self-pay | Admitting: Family Medicine

## 2018-04-27 DIAGNOSIS — I10 Essential (primary) hypertension: Secondary | ICD-10-CM | POA: Diagnosis not present

## 2018-04-27 DIAGNOSIS — I499 Cardiac arrhythmia, unspecified: Secondary | ICD-10-CM | POA: Diagnosis not present

## 2018-04-27 DIAGNOSIS — E785 Hyperlipidemia, unspecified: Secondary | ICD-10-CM | POA: Diagnosis not present

## 2018-05-20 ENCOUNTER — Ambulatory Visit (INDEPENDENT_AMBULATORY_CARE_PROVIDER_SITE_OTHER): Payer: PPO | Admitting: Family Medicine

## 2018-05-20 ENCOUNTER — Encounter: Payer: Self-pay | Admitting: Family Medicine

## 2018-05-20 VITALS — BP 121/64 | HR 65 | Temp 97.5°F | Wt 169.8 lb

## 2018-05-20 DIAGNOSIS — R590 Localized enlarged lymph nodes: Secondary | ICD-10-CM

## 2018-05-20 DIAGNOSIS — H9201 Otalgia, right ear: Secondary | ICD-10-CM | POA: Diagnosis not present

## 2018-05-20 MED ORDER — AMOXICILLIN 500 MG PO CAPS: 1000.0000 mg | ORAL_CAPSULE | Freq: Two times a day (BID) | ORAL | 0 refills | Status: AC

## 2018-05-20 NOTE — Progress Notes (Signed)
Patient: Matthew Clayton Male    DOB: 06/04/1926   85 y.o.   MRN: [id number] Visit Date: 05/20/2018  Today's Provider: Lelon Huh, MD   Chief Complaint  Patient presents with  . Otalgia   Subjective:     Otalgia   There is pain in the right ear. The current episode started in the past 7 days. There has been no fever. The pain is at a severity of 1/10. Pertinent negatives include no abdominal pain, ear discharge, sore throat or vomiting. He has tried NSAIDs for the symptoms. The treatment provided mild relief.    No Known Allergies   Current Outpatient Medications:  .  aspirin EC 81 MG tablet, Take 81 mg by mouth daily., Disp: , Rfl:  .  cetirizine (ZYRTEC) 10 MG tablet, Take 1 tablet (10 mg total) by mouth daily., Disp: 30 tablet, Rfl: 11 .  fluticasone (FLONASE) 50 MCG/ACT nasal spray, SPRAY 2 SPRAYS INTO EACH NOSTRIL EVERY DAY, Disp: 48 g, Rfl: 1 .  Multiple Vitamins-Minerals (CENTRUM SILVER PO), Take by mouth., Disp: , Rfl:  .  simvastatin (ZOCOR) 40 MG tablet, TAKE 1 TABLET (40 MG TOTAL) BY MOUTH DAILY. NEEDS APPT FOR FURTHER RX., Disp: 90 tablet, Rfl: 3  Review of Systems  Constitutional: Negative for appetite change, chills and fever.  HENT: Positive for ear pain. Negative for congestion, ear discharge, sinus pressure, sinus pain, sore throat and tinnitus.   Respiratory: Negative for chest tightness, shortness of breath and wheezing.   Cardiovascular: Negative for chest pain and palpitations.  Gastrointestinal: Negative for abdominal pain, nausea and vomiting.    Social History   Tobacco Use  . Smoking status: Former Smoker    Packs/day: 1.50    Years: 20.00    Pack years: 30.00    Types: Cigarettes    Last attempt to quit: 05/16/1960    Years since quitting: 58.0  . Smokeless tobacco: Never Used  . Tobacco comment: quit in 1968  Substance Use Topics  . Alcohol use: No    Alcohol/week: 0.0 standard drinks      Objective:   BP 121/64 (BP Location:  Right Arm, Patient Position: Sitting, Cuff Size: Normal)   Pulse 65   Temp (!) 97.5 F (36.4 C) (Oral)   Wt 169 lb 12.8 oz (77 kg)   SpO2 96%   BMI 28.26 kg/m  Vitals:   05/20/18 1009  BP: 121/64  Pulse: 65  Temp: (!) 97.5 F (36.4 C)  TempSrc: Oral  SpO2: 96%  Weight: 169 lb 12.8 oz (77 kg)     Physical Exam  General Appearance:    Alert, cooperative, no distress  HENT:   neck without nodes, throat normal without erythema or exudate and sinuses non-tender Slight erythema around right TM. Mild right pre-auricular swelling and tenderness. Left TM normal, both ear canals clear.   Eyes:    PERRL, conjunctiva/corneas clear, EOM's intact       Lungs:     Clear to auscultation bilaterally, respirations unlabored  Heart:    Regular rate and rhythm  Neurologic:   Awake, alert, oriented x 3. No apparent focal neurological           defect.           Assessment & Plan    1. Right ear pain   2. Lymphadenopathy, preauricular  Likely early OM and worsening.  - amoxicillin (AMOXIL) 500 MG capsule; Take 2 capsules (1,000 mg total) by  mouth 2 (two) times daily for 10 days.  Dispense: 40 capsule; Refill: 0      Lelon Huh, MD  [facility]

## 2018-05-20 NOTE — Patient Instructions (Signed)
You can take 2 (two) OTC ibuprofen three times a day for the next 5-6 days for ear pain  Call if ear is not much better in 5-6 days

## 2018-05-21 IMAGING — US US EXTREM LOW VENOUS*L*
1 series · 13 of 24 positions shown · non-contrast
Comparison: None.

CLINICAL DATA: Left lower extremity edema.  Evaluate for DVT.



[Series 1: us extrem low venous*left* · 0.07mm/px · 13 of 33 slices shown]
[im 1/33]
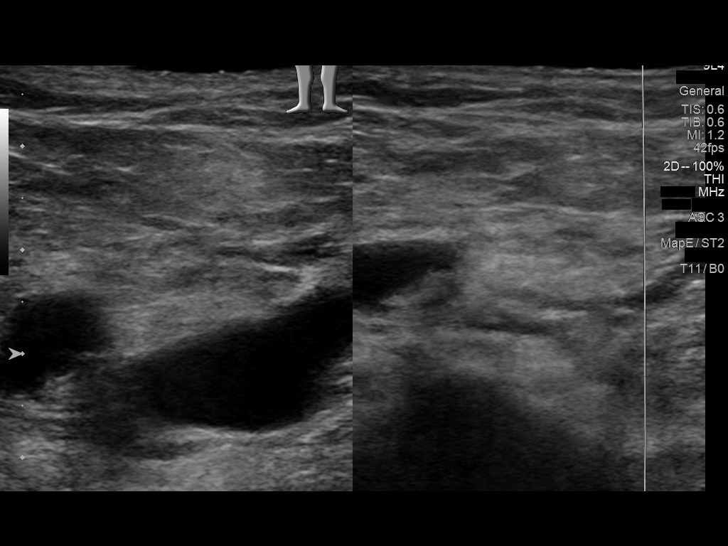
[im 3/33]
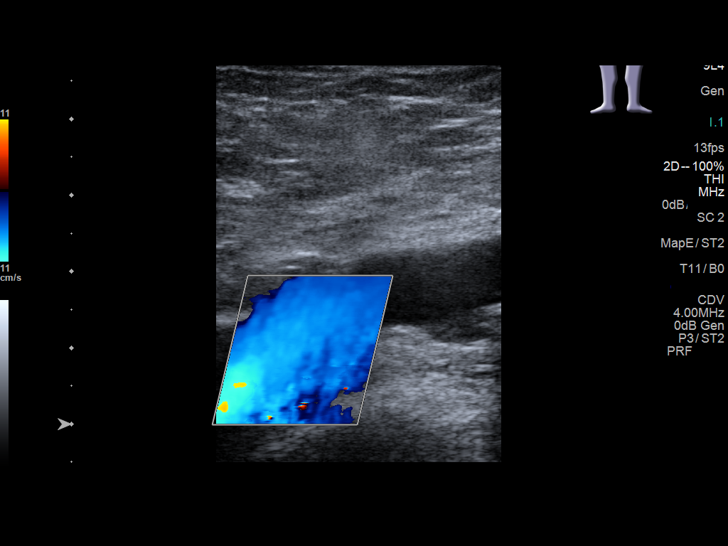
[im 6/33]
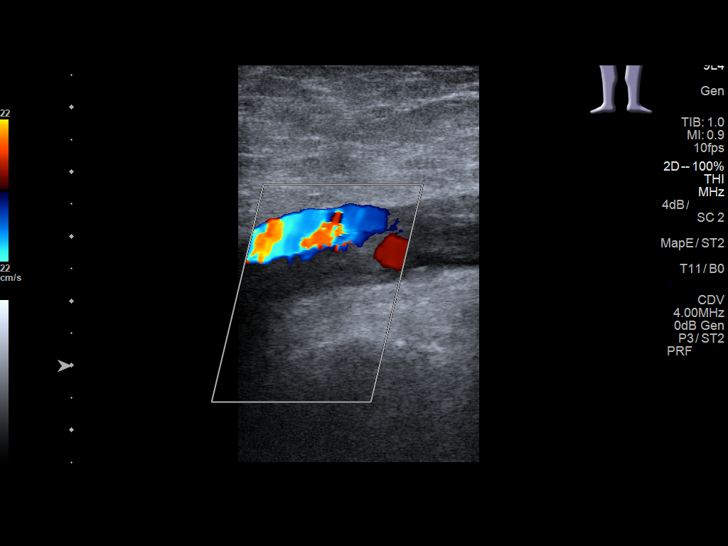
[im 9/33]
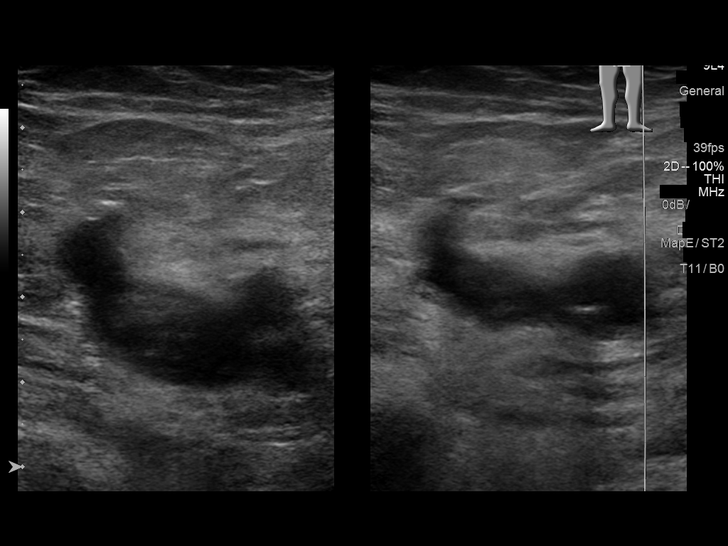
[im 12/33]
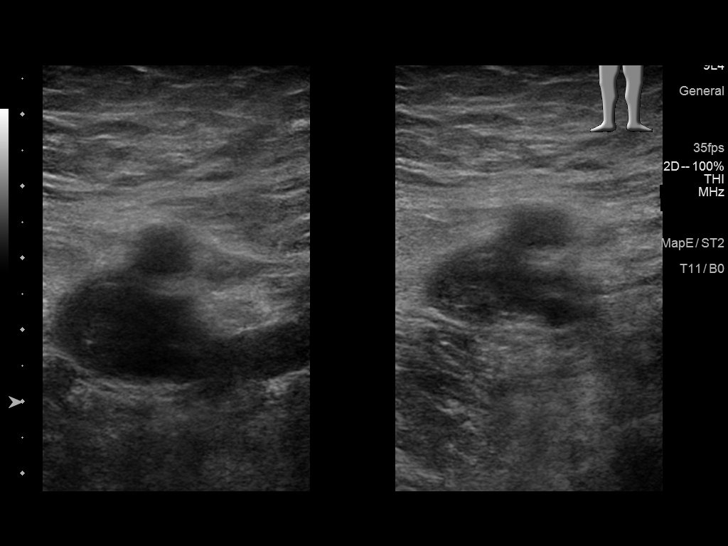
[im 14/33]
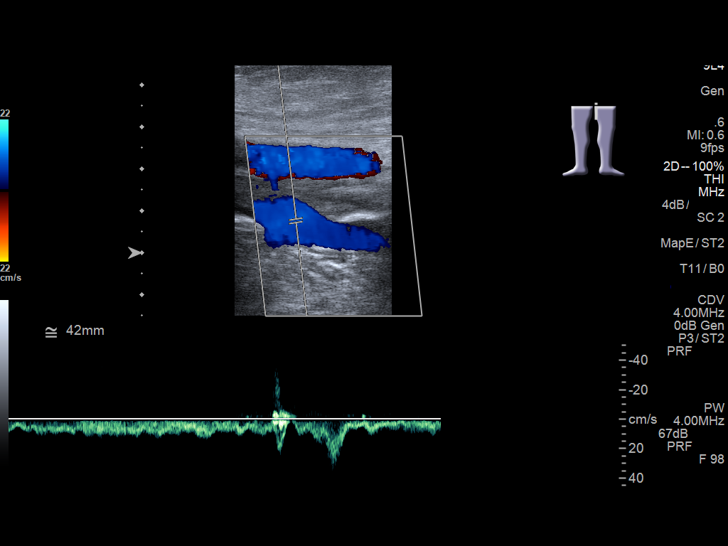
[im 17/33]
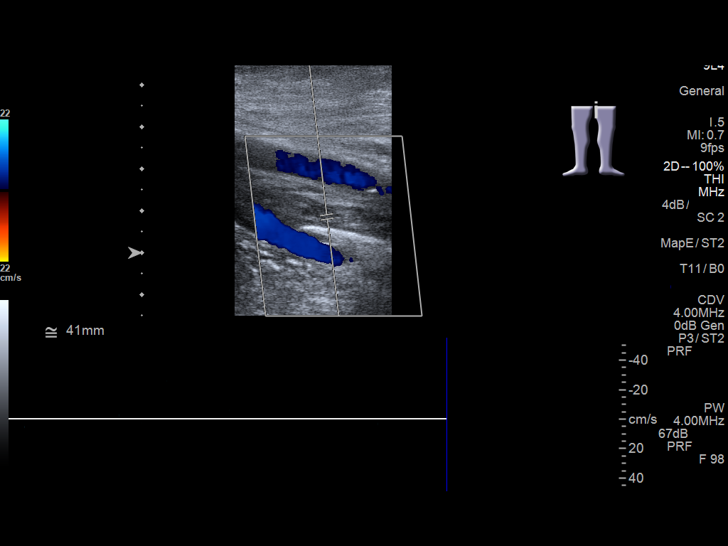
[im 19/33]
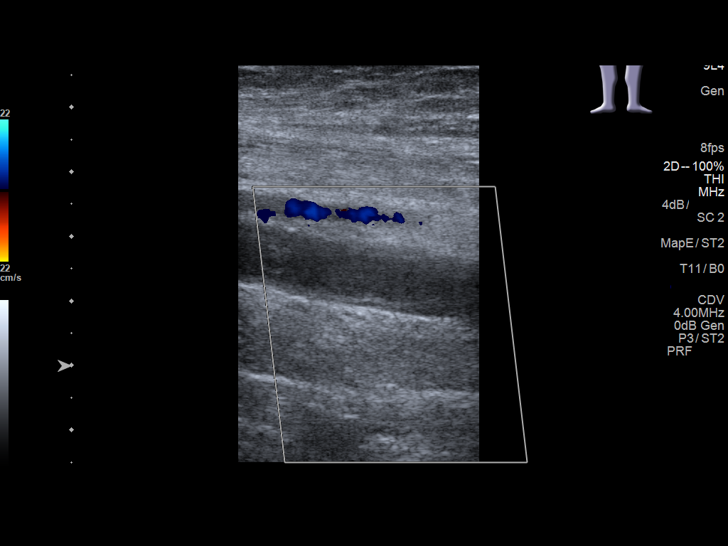
[im 21/33]
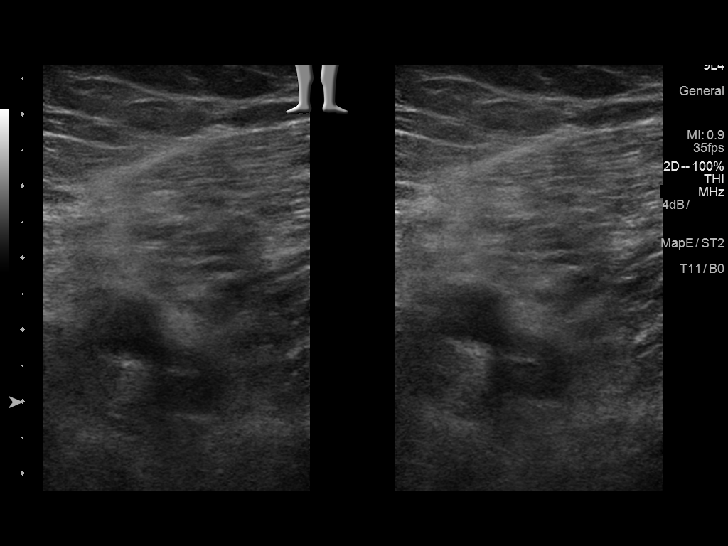
[im 24/33]
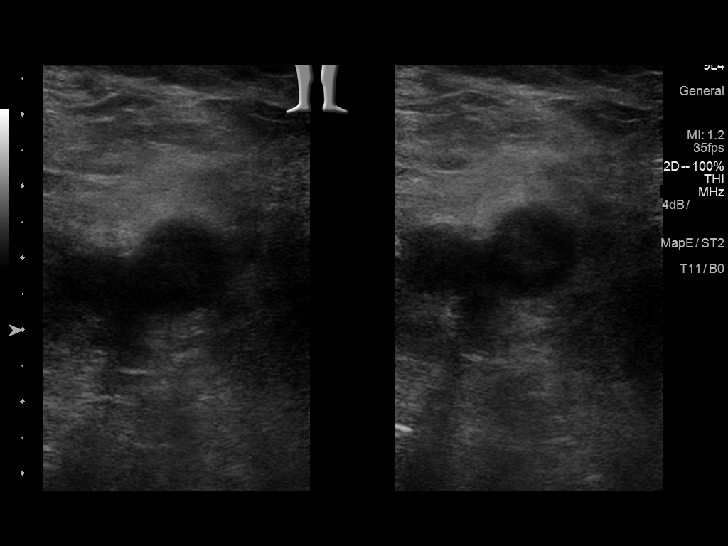
[im 27/33]
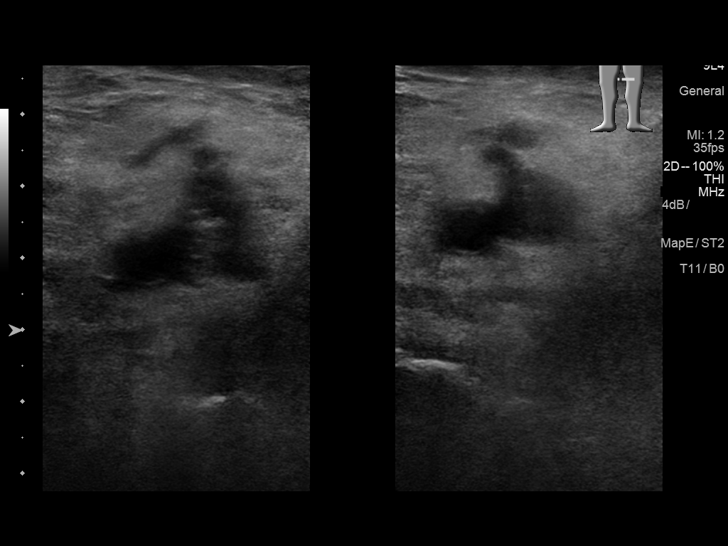
[im 30/33]
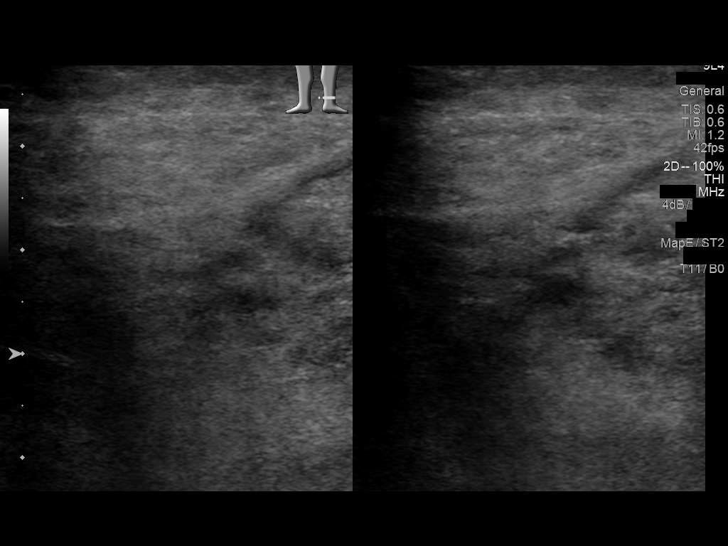
[im 33/33]
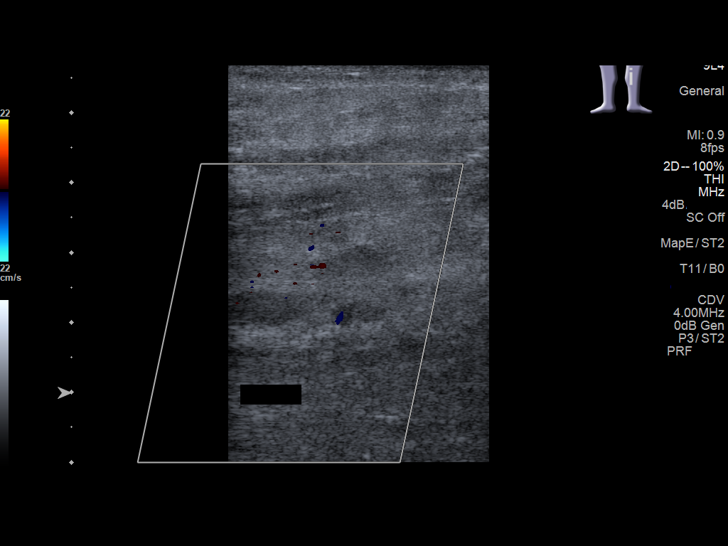

[13 of 24 positions shown; findings below may reference images not displayed]

FINDINGS: Contralateral Common Femoral Vein: Respiratory phasicity is normal
and symmetric with the symptomatic side. No evidence of thrombus.
Normal compressibility.

There is mixed echogenic nonocclusive thrombus within the left
common femoral vein (image 7).

The saphenofemoral junction and deep femoral veins appear patent
where imaged (image 22).

There is mixed echogenic occlusive thrombus throughout the
interrogated course of the left femoral (images 7, 20 and 23) and
popliteal veins (images 26 and 29) extending into the interrogated
portions of the left posterior tibial and peroneal veins.

Other Findings:  None.
IMPRESSION: Examination is positive for extensive largely occlusive DVT
extending from the left common femoral vein through the imaged
tibial veins.

## 2018-06-01 ENCOUNTER — Encounter: Payer: Self-pay | Admitting: Family Medicine

## 2018-06-01 ENCOUNTER — Other Ambulatory Visit: Payer: Self-pay

## 2018-06-01 ENCOUNTER — Ambulatory Visit (INDEPENDENT_AMBULATORY_CARE_PROVIDER_SITE_OTHER): Payer: PPO | Admitting: Family Medicine

## 2018-06-01 VITALS — BP 130/72 | HR 61 | Temp 97.5°F | Ht 66.0 in | Wt 172.8 lb

## 2018-06-01 DIAGNOSIS — M26621 Arthralgia of right temporomandibular joint: Secondary | ICD-10-CM

## 2018-06-01 MED ORDER — MELOXICAM 7.5 MG PO TABS: 7.5000 mg | ORAL_TABLET | Freq: Every day | ORAL | 0 refills | Status: DC

## 2018-06-01 NOTE — Patient Instructions (Signed)
You may add Tylenol up to 3000 mg/day for jaw discomfort in addition to the meloxicam. Call if not improving over the next one to two weeks.

## 2018-06-01 NOTE — Progress Notes (Signed)
  Subjective:     Patient ID: Matthew Clayton, male   DOB: March 26, 1927, 85 y.o.   MRN: [id number] Chief Complaint  Patient presents with  . Ear Fullness    started at end of dec 2019 and was given amoxicillin and sdidn't feel it helped much.  still having fullness feeling   HPI Also reports right jaw discomfort when moving his jaw. Denies bruxism, gum or hard food chewing or dental pain.  Review of Systems     Objective:   Physical Exam Constitutional:      General: He is not in acute distress.    Appearance: He is not ill-appearing.  HENT:     Right Ear: Tympanic membrane and ear canal normal.     Left Ear: Tympanic membrane and ear canal normal.     Mouth/Throat:     Comments: No tenderness on percussion of his posterior molars. Musculoskeletal:     Comments: Mildly tender over his right TMJ. No crepitus.  Lymphadenopathy:     Cervical: No cervical adenopathy.  Neurological:     Mental Status: He is alert.        Assessment:    1. Arthralgia of right temporomandibular joint: start meloxicam 7.5 mg.    Plan:    Avoid hard foods and may also use Tylenol as needed. Call for ENT referral if not improving over the next 1-2 weeks.

## 2018-06-25 ENCOUNTER — Other Ambulatory Visit: Payer: Self-pay | Admitting: Family Medicine

## 2018-07-05 ENCOUNTER — Encounter: Payer: Self-pay | Admitting: Family Medicine

## 2018-07-05 ENCOUNTER — Other Ambulatory Visit: Payer: Self-pay

## 2018-07-05 ENCOUNTER — Ambulatory Visit (INDEPENDENT_AMBULATORY_CARE_PROVIDER_SITE_OTHER): Payer: PPO | Admitting: Family Medicine

## 2018-07-05 VITALS — BP 120/66 | HR 59 | Temp 97.5°F | Ht 66.0 in | Wt 172.6 lb

## 2018-07-05 DIAGNOSIS — I10 Essential (primary) hypertension: Secondary | ICD-10-CM

## 2018-07-05 DIAGNOSIS — M26649 Arthritis of unspecified temporomandibular joint: Secondary | ICD-10-CM

## 2018-07-05 DIAGNOSIS — M2669 Other specified disorders of temporomandibular joint: Secondary | ICD-10-CM | POA: Diagnosis not present

## 2018-07-05 DIAGNOSIS — K219 Gastro-esophageal reflux disease without esophagitis: Secondary | ICD-10-CM | POA: Diagnosis not present

## 2018-07-05 DIAGNOSIS — H6981 Other specified disorders of Eustachian tube, right ear: Secondary | ICD-10-CM | POA: Diagnosis not present

## 2018-07-05 DIAGNOSIS — E782 Mixed hyperlipidemia: Secondary | ICD-10-CM

## 2018-07-05 MED ORDER — PREDNISONE 10 MG (21) PO TBPK: ORAL_TABLET | ORAL | 0 refills | Status: DC

## 2018-07-05 NOTE — Progress Notes (Signed)
Patient: Matthew Clayton Male    DOB: 22-Feb-1927   85 y.o.   MRN: [id number] Visit Date: 07/05/2018  Today's Provider: [name], MD   Chief Complaint  Patient presents with  . Follow-up    1 month recheck on ear pain and ear ache   Subjective:     HPI   Follow up for ear ache and ear pain  The patient was last seen for this 1 months ago. Changes made at last visit include .started on Meloxicam 7.5 mg He reports good compliance with treatment. He feels that condition is Unchanged. He is not having side effects.   Pt reports that he still feels like his right ear is stopped up but the canal is clear.  He reports that he can hear out of it.    ------------------------------------------------------------------------------------   No Known Allergies   Current Outpatient Medications:  .  aspirin EC 81 MG tablet, Take 81 mg by mouth daily., Disp: , Rfl:  .  cetirizine (ZYRTEC) 10 MG tablet, Take 1 tablet (10 mg total) by mouth daily., Disp: 30 tablet, Rfl: 11 .  meloxicam (MOBIC) 7.5 MG tablet, Take 1 tablet (7.5 mg total) by mouth daily., Disp: 30 tablet, Rfl: 0 .  Multiple Vitamins-Minerals (CENTRUM SILVER PO), Take by mouth., Disp: , Rfl:  .  simvastatin (ZOCOR) 40 MG tablet, TAKE 1 TABLET (40 MG TOTAL) BY MOUTH DAILY. NEEDS APPT FOR FURTHER RX., Disp: 90 tablet, Rfl: 3 .  fluticasone (FLONASE) 50 MCG/ACT nasal spray, SPRAY 2 SPRAYS INTO EACH NOSTRIL EVERY DAY (Patient not taking: Reported on 06/01/2018), Disp: 48 g, Rfl: 1  Review of Systems  Constitutional: Negative.   HENT: Positive for ear pain (right ear stuffiness).   Eyes: Negative.   Respiratory: Negative.   Cardiovascular: Negative.   Gastrointestinal: Negative.   Endocrine: Negative.   Genitourinary: Negative.   Musculoskeletal: Negative.   Skin: Negative.   Allergic/Immunologic: Negative.   Neurological: Negative.   Hematological: Negative.   Psychiatric/Behavioral: Negative.     Social  History   Tobacco Use  . Smoking status: Former Smoker    Packs/day: 1.50    Years: 20.00    Pack years: 30.00    Types: Cigarettes    Last attempt to quit: 05/16/1960    Years since quitting: 58.1  . Smokeless tobacco: Never Used  . Tobacco comment: quit in 1968  Substance Use Topics  . Alcohol use: No    Alcohol/week: 0.0 standard drinks      Objective:   BP 120/66 (BP Location: Left Arm, Patient Position: Sitting, Cuff Size: Normal)   Pulse (!) 59   Temp (!) 97.5 F (36.4 C) (Oral)   Ht 5\' 6"  (1.676 m)   Wt 172 lb 9.6 oz (78.3 kg)   SpO2 98%   BMI 27.86 kg/m  Vitals:   07/05/18 1522  BP: 120/66  Pulse: (!) 59  Temp: (!) 97.5 F (36.4 C)  TempSrc: Oral  SpO2: 98%  Weight: 172 lb 9.6 oz (78.3 kg)  Height: 5\' 6"  (1.676 m)     Physical Exam Vitals signs reviewed.  Constitutional:      Appearance: Normal appearance. He is well-developed.  HENT:     Head: Normocephalic and atraumatic.     Right Ear: Tympanic membrane and external ear normal.     Left Ear: Tympanic membrane and external ear normal.     Nose: Nose normal.     Mouth/Throat:  Comments: Mild tenderness over the right TMJ area without mass-effect or without clicking.  No temporal artery tenderness Eyes:     General: No scleral icterus.    Conjunctiva/sclera: Conjunctivae normal.  Neck:     Thyroid: No thyromegaly.  Cardiovascular:     Rate and Rhythm: Regular rhythm.     Heart sounds: Normal heart sounds.     Comments: Bradycardic,asymptomatic. Pulmonary:     Effort: Pulmonary effort is normal.     Breath sounds: Normal breath sounds.  Abdominal:     Palpations: Abdomen is soft.  Skin:    General: Skin is warm and dry.  Neurological:     Mental Status: He is alert and oriented to person, place, and time.  Psychiatric:        Behavior: Behavior normal.        Thought Content: Thought content normal.        Judgment: Judgment normal.         Assessment & Plan    1. TMJ  arthritis Discussed TMJ at length with patient.  We will try to treat with prednisone.  Other low-dose Valium.  Refer to ENT if he does not respond. - predniSONE (STERAPRED UNI-PAK 21 TAB) 10 MG (21) TBPK tablet; Taper as directed.  Dispense: 21 tablet; Refill: 0  2. Mixed hyperlipidemia   3. Gastroesophageal reflux disease without esophagitis   4. Dysfunction of right eustachian tube   5. Essential hypertension  I have done the exam and reviewed the chart and it is accurate to the best of my knowledge. Development worker, community has been used and  any errors in dictation or transcription are unintentional. Miguel Aschoff M.D. Darke, MD  [facility]

## 2018-07-11 ENCOUNTER — Telehealth: Payer: Self-pay | Admitting: Family Medicine

## 2018-07-11 NOTE — Telephone Encounter (Signed)
Please review. Thanks!  

## 2018-07-11 NOTE — Telephone Encounter (Signed)
Pt wants to let Dr. Rosanna Randy know the predniSONE (STERAPRED UNI-PAK 21 TAB) 10 MG (21) TBPK tablet is working great!  Thanks, [employer]

## 2018-07-17 ENCOUNTER — Telehealth: Payer: Self-pay | Admitting: Family Medicine

## 2018-07-17 DIAGNOSIS — R6884 Jaw pain: Secondary | ICD-10-CM

## 2018-07-17 DIAGNOSIS — H9201 Otalgia, right ear: Secondary | ICD-10-CM

## 2018-07-17 NOTE — Telephone Encounter (Signed)
Pt letting Dr. Rosanna Randy know he is stilling having issues with his Rt jaw and ear.  Wanting know the next step.  Please advise.  Thanks, [employer]

## 2018-07-18 DIAGNOSIS — I499 Cardiac arrhythmia, unspecified: Secondary | ICD-10-CM | POA: Diagnosis not present

## 2018-07-18 DIAGNOSIS — I358 Other nonrheumatic aortic valve disorders: Secondary | ICD-10-CM | POA: Diagnosis not present

## 2018-07-18 DIAGNOSIS — I10 Essential (primary) hypertension: Secondary | ICD-10-CM | POA: Diagnosis not present

## 2018-07-18 DIAGNOSIS — E785 Hyperlipidemia, unspecified: Secondary | ICD-10-CM | POA: Diagnosis not present

## 2018-07-19 NOTE — Telephone Encounter (Signed)
ENT referral

## 2018-07-19 NOTE — Telephone Encounter (Signed)
Left message for patient to call back, ENT referral has been placed. KW

## 2018-07-28 ENCOUNTER — Telehealth: Payer: Self-pay | Admitting: Family Medicine

## 2018-07-28 NOTE — Telephone Encounter (Signed)
Pt's daughter Gwinda Passe called saying she was speaking with Jiles Garter and got disconnected.  She has specific questions regarding the corona virus.  CB# [phone]   Thanks  Con Memos

## 2018-07-31 NOTE — Telephone Encounter (Signed)
Spoke with daughter

## 2018-10-12 ENCOUNTER — Ambulatory Visit (INDEPENDENT_AMBULATORY_CARE_PROVIDER_SITE_OTHER): Payer: PPO | Admitting: Family Medicine

## 2018-10-12 ENCOUNTER — Encounter: Payer: Self-pay | Admitting: Family Medicine

## 2018-10-12 ENCOUNTER — Other Ambulatory Visit: Payer: Self-pay

## 2018-10-12 VITALS — BP 100/60 | HR 60 | Temp 98.3°F | Resp 16 | Wt 166.0 lb

## 2018-10-12 DIAGNOSIS — H109 Unspecified conjunctivitis: Secondary | ICD-10-CM | POA: Diagnosis not present

## 2018-10-12 MED ORDER — ERYTHROMYCIN 5 MG/GM OP OINT: 1.0000 "application " | TOPICAL_OINTMENT | Freq: Three times a day (TID) | OPHTHALMIC | 0 refills | Status: DC

## 2018-10-12 NOTE — Progress Notes (Addendum)
Patient: Matthew Clayton Male    DOB: [birth year]   85 y.o.   MRN: [id number] Visit Date: 10/12/2018  Today's Provider: Vernie Murders, PA   Chief Complaint  Patient presents with  . Eye Pain   Subjective:     HPI Left eye irritation and redness that started 4 days ago. Patient states that it is irritated inside the eyelid on the top.  Past Medical History:  Diagnosis Date  . DVT (deep venous thrombosis) (HCC)    left leg  . Hyperlipidemia    Past Surgical History:  Procedure Laterality Date  . APPENDECTOMY    . MOLE REMOVAL    . TONSILLECTOMY AND ADENOIDECTOMY    . TOTAL HIP ARTHROPLASTY Bilateral    Family History  Problem Relation Age of Onset  . Breast cancer Mother   . Cancer Mother        eye cancer  . Heart disease Father   . Breast cancer Sister   . Stroke Brother   . Heart attack Brother   . Breast cancer Sister   . Aneurysm Sister    No Known Allergies  Current Outpatient Medications:  .  aspirin EC 81 MG tablet, Take 81 mg by mouth daily., Disp: , Rfl:  .  cetirizine (ZYRTEC) 10 MG tablet, Take 1 tablet (10 mg total) by mouth daily., Disp: 30 tablet, Rfl: 11 .  meloxicam (MOBIC) 7.5 MG tablet, Take 1 tablet (7.5 mg total) by mouth daily., Disp: 30 tablet, Rfl: 0 .  Multiple Vitamins-Minerals (CENTRUM SILVER PO), Take by mouth., Disp: , Rfl:  .  simvastatin (ZOCOR) 40 MG tablet, TAKE 1 TABLET (40 MG TOTAL) BY MOUTH DAILY. NEEDS APPT FOR FURTHER RX., Disp: 90 tablet, Rfl: 3 .  fluticasone (FLONASE) 50 MCG/ACT nasal spray, SPRAY 2 SPRAYS INTO EACH NOSTRIL EVERY DAY (Patient not taking: Reported on 06/01/2018), Disp: 48 g, Rfl: 1 .  predniSONE (STERAPRED UNI-PAK 21 TAB) 10 MG (21) TBPK tablet, Taper as directed. (Patient not taking: Reported on 10/12/2018), Disp: 21 tablet, Rfl: 0  Review of Systems  Eyes: Positive for pain, redness and itching.    Social History   Tobacco Use  . Smoking status: Former Smoker    Packs/day: 1.50    Years: 20.00     Pack years: 30.00    Types: Cigarettes    Last attempt to quit: 05/16/1960    Years since quitting: 58.4  . Smokeless tobacco: Never Used  . Tobacco comment: quit in 1968  Substance Use Topics  . Alcohol use: No    Alcohol/week: 0.0 standard drinks      Objective:   BP 100/60 (BP Location: Right Arm, Patient Position: Sitting, Cuff Size: Normal)   Pulse 60   Temp 98.3 F (36.8 C) (Oral)   Resp 16   Wt 166 lb (75.3 kg)   SpO2 95%   BMI 26.79 kg/m  Vitals:   10/12/18 1440  BP: 100/60  Pulse: 60  Resp: 16  Temp: 98.3 F (36.8 C)  TempSrc: Oral  SpO2: 95%  Weight: 166 lb (75.3 kg)   Physical Exam Constitutional:      General: He is not in acute distress.    Appearance: He is well-developed.  HENT:     Head: Normocephalic and atraumatic.     Right Ear: Hearing normal.     Left Ear: Hearing normal.     Nose: Nose normal.  Eyes:     General: Lids are  normal. No scleral icterus.       Right eye: No discharge.        Left eye: No discharge.     Conjunctiva/sclera: Conjunctivae normal.     Comments: Left conjunctiva red with dilated scleral vessels laterally up to iris border. Some scratchy sensation in the left upper outer eye. No foreign body seen. Good EOM with PERRLA.  Pulmonary:     Effort: Pulmonary effort is normal. No respiratory distress.  Musculoskeletal: Normal range of motion.  Skin:    Findings: No lesion or rash.  Neurological:     Mental Status: He is alert and oriented to person, place, and time.  Psychiatric:        Speech: Speech normal.        Behavior: Behavior normal.        Thought Content: Thought content normal.        Assessment & Plan    1. Conjunctivitis of left eye, unspecified conjunctivitis type States he probably got something in the left eye while mowing 3-4 days ago. Still feels it has something in it but nothing seen under magnified exam. Conjunctivae of the left eye is red and dilates scleral vessels. Suspect mechanical  irritation from foreign body that has flushed away. Will treat with Erythomycin Ointment and warned not to rub the eye. Recheck if no better in 3-4 days (may need ophthalmology referral for slit lamp exam). - erythromycin ophthalmic ointment; Place 1 application into the left eye 3 (three) times daily.  Dispense: 3.5 g; Refill: 0     Vernie Murders, PA  [facility]

## 2018-11-01 ENCOUNTER — Telehealth: Payer: Self-pay | Admitting: Family Medicine

## 2018-11-01 NOTE — Telephone Encounter (Signed)
Pt is going to be going to the beach July 5th with some people and he would like to talk with you about restrictions for guest he has coming....  CB#  [phone]  Thanks Con Memos

## 2018-11-01 NOTE — Telephone Encounter (Signed)
Please advise 

## 2018-12-06 ENCOUNTER — Ambulatory Visit: Payer: PPO

## 2018-12-12 ENCOUNTER — Encounter: Payer: Self-pay | Admitting: Family Medicine

## 2018-12-12 ENCOUNTER — Other Ambulatory Visit: Payer: Self-pay

## 2018-12-12 NOTE — Progress Notes (Deleted)
       Patient: Matthew Clayton Male    DOB: 23-Oct-1926   85 y.o.   MRN: [id number] Visit Date: 12/12/2018  Today's Provider: [name], MD   Chief Complaint  Patient presents with  . Fever   Subjective:     HPI  Patient states that he just needed to be looked over because of an elevated temperature of 99.9 or higher. No possible exposures, and no other symptoms.   Virtual Visit via Telephone Note  I connected with Matthew Clayton on 12/12/18 at  4:00 PM EDT by telephone and verified that I am speaking with the correct person using two identifiers.     I discussed the assessment and treatment plan with the patient. The patient was provided an opportunity to ask questions and all were answered. The patient agreed with the plan and demonstrated an understanding of the instructions.   The patient was advised to call back or seek an in-person evaluation if the symptoms worsen or if the condition fails to improve as anticipated.  I provided *** minutes of non-face-to-face time during this encounter.     No Known Allergies   Current Outpatient Medications:  .  aspirin EC 81 MG tablet, Take 81 mg by mouth daily., Disp: , Rfl:  .  cetirizine (ZYRTEC) 10 MG tablet, Take 1 tablet (10 mg total) by mouth daily., Disp: 30 tablet, Rfl: 11 .  erythromycin ophthalmic ointment, Place 1 application into the left eye 3 (three) times daily., Disp: 3.5 g, Rfl: 0 .  fluticasone (FLONASE) 50 MCG/ACT nasal spray, SPRAY 2 SPRAYS INTO EACH NOSTRIL EVERY DAY, Disp: 48 g, Rfl: 1 .  meloxicam (MOBIC) 7.5 MG tablet, Take 1 tablet (7.5 mg total) by mouth daily., Disp: 30 tablet, Rfl: 0 .  Multiple Vitamins-Minerals (CENTRUM SILVER PO), Take by mouth., Disp: , Rfl:  .  simvastatin (ZOCOR) 40 MG tablet, TAKE 1 TABLET (40 MG TOTAL) BY MOUTH DAILY. NEEDS APPT FOR FURTHER RX., Disp: 90 tablet, Rfl: 3 .  predniSONE (STERAPRED UNI-PAK 21 TAB) 10 MG (21) TBPK tablet, Taper as directed. (Patient not taking:  Reported on 10/12/2018), Disp: 21 tablet, Rfl: 0  Review of Systems  Constitutional: Positive for fever.    Social History   Tobacco Use  . Smoking status: Former Smoker    Packs/day: 1.50    Years: 20.00    Pack years: 30.00    Types: Cigarettes    Quit date: 05/16/1960    Years since quitting: 58.6  . Smokeless tobacco: Never Used  . Tobacco comment: quit in 1968  Substance Use Topics  . Alcohol use: No    Alcohol/week: 0.0 standard drinks      Objective:   Temp 99.9 F (37.7 C) (Oral)  Vitals:   12/12/18 1026  Temp: 99.9 F (37.7 C)  TempSrc: Oral     Physical Exam   No results found for any visits on 12/12/18.     Assessment & Plan        [name], MD  [facility]

## 2019-02-06 ENCOUNTER — Telehealth: Payer: Self-pay | Admitting: Family Medicine

## 2019-02-06 NOTE — Telephone Encounter (Signed)
Pt called to let Dr. Rosanna Randy know he got  #23 Shot - Pneumonia Shot at [facility].  Thanks, [employer]

## 2019-03-16 ENCOUNTER — Other Ambulatory Visit: Payer: Self-pay | Admitting: *Deleted

## 2019-03-16 DIAGNOSIS — Z20822 Contact with and (suspected) exposure to covid-19: Secondary | ICD-10-CM

## 2019-03-17 LAB — NOVEL CORONAVIRUS, NAA: SARS-CoV-2, NAA: NOT DETECTED

## 2019-04-09 ENCOUNTER — Other Ambulatory Visit: Payer: Self-pay | Admitting: *Deleted

## 2019-04-09 DIAGNOSIS — Z20822 Contact with and (suspected) exposure to covid-19: Secondary | ICD-10-CM

## 2019-04-10 ENCOUNTER — Encounter: Payer: Self-pay | Admitting: Family Medicine

## 2019-04-10 ENCOUNTER — Ambulatory Visit (INDEPENDENT_AMBULATORY_CARE_PROVIDER_SITE_OTHER): Payer: PPO | Admitting: Family Medicine

## 2019-04-10 ENCOUNTER — Other Ambulatory Visit: Payer: Self-pay

## 2019-04-10 VITALS — BP 134/80 | HR 77 | Temp 96.9°F | Resp 16 | Wt 165.6 lb

## 2019-04-10 DIAGNOSIS — L570 Actinic keratosis: Secondary | ICD-10-CM

## 2019-04-10 NOTE — Progress Notes (Signed)
Patient: Matthew Clayton Male    DOB: 13-Dec-1926   85 y.o.   MRN: [id number] Visit Date: 04/10/2019  Today's Provider: Vernie Murders, PA   Chief Complaint  Patient presents with  . Skin Problem   Subjective:     HPI Patient comes into office today with concerns of a lesion/barnacle that he has found on his left shoulder. Patient states that it has been present for several years. Patient states that he first noticed change to skin yesterday and states that it is red. Denies pain at site.   Past Surgical History:  Procedure Laterality Date  . APPENDECTOMY    . MOLE REMOVAL    . TONSILLECTOMY AND ADENOIDECTOMY    . TOTAL HIP ARTHROPLASTY Bilateral    Family History  Problem Relation Age of Onset  . Breast cancer Mother   . Cancer Mother        eye cancer  . Heart disease Father   . Breast cancer Sister   . Stroke Brother   . Heart attack Brother   . Breast cancer Sister   . Aneurysm Sister    Past Medical History:  Diagnosis Date  . DVT (deep venous thrombosis) (HCC)    left leg  . Hyperlipidemia    No Known Allergies  Current Outpatient Medications:  .  aspirin EC 81 MG tablet, Take 81 mg by mouth daily., Disp: , Rfl:  .  cetirizine (ZYRTEC) 10 MG tablet, Take 1 tablet (10 mg total) by mouth daily., Disp: 30 tablet, Rfl: 11 .  erythromycin ophthalmic ointment, Place 1 application into the left eye 3 (three) times daily., Disp: 3.5 g, Rfl: 0 .  fluticasone (FLONASE) 50 MCG/ACT nasal spray, SPRAY 2 SPRAYS INTO EACH NOSTRIL EVERY DAY, Disp: 48 g, Rfl: 1 .  meloxicam (MOBIC) 7.5 MG tablet, Take 1 tablet (7.5 mg total) by mouth daily., Disp: 30 tablet, Rfl: 0 .  Multiple Vitamins-Minerals (CENTRUM SILVER PO), Take by mouth., Disp: , Rfl:  .  simvastatin (ZOCOR) 40 MG tablet, TAKE 1 TABLET (40 MG TOTAL) BY MOUTH DAILY. NEEDS APPT FOR FURTHER RX., Disp: 90 tablet, Rfl: 3 .  predniSONE (STERAPRED UNI-PAK 21 TAB) 10 MG (21) TBPK tablet, Taper as directed. (Patient not  taking: Reported on 10/12/2018), Disp: 21 tablet, Rfl: 0  Review of Systems  Constitutional: Negative.   Skin:       Lesion on left shoulder.    Social History   Tobacco Use  . Smoking status: Former Smoker    Packs/day: 1.50    Years: 20.00    Pack years: 30.00    Types: Cigarettes    Quit date: 05/16/1960    Years since quitting: 58.9  . Smokeless tobacco: Never Used  . Tobacco comment: quit in 1968  Substance Use Topics  . Alcohol use: No    Alcohol/week: 0.0 standard drinks      Objective:   BP 134/80   Pulse 77   Temp (!) 96.9 F (36.1 C) (Oral)   Resp 16   Wt 165 lb 9.6 oz (75.1 kg)   BMI 26.73 kg/m  Vitals:   04/10/19 1618  BP: 134/80  Pulse: 77  Resp: 16  Temp: (!) 96.9 F (36.1 C)  TempSrc: Oral  Weight: 165 lb 9.6 oz (75.1 kg)  Body mass index is 26.73 kg/m.  Physical Exam Constitutional:      General: He is not in acute distress.    Appearance: He is  well-developed.  HENT:     Head: Normocephalic and atraumatic.     Right Ear: Hearing normal.     Left Ear: Hearing normal.     Nose: Nose normal.  Eyes:     General: Lids are normal. No scleral icterus.       Right eye: No discharge.        Left eye: No discharge.     Conjunctiva/sclera: Conjunctivae normal.  Pulmonary:     Effort: Pulmonary effort is normal. No respiratory distress.  Musculoskeletal: Normal range of motion.  Skin:    Findings: Lesion present. No rash.     Comments: 1.5 cm erythematous lesion with 0.7 cm central white hard scale on the right shoulder. Second lesion is 0.7 cm bluish-brown waxy lesion with slight surrounding erythema on the upper posterior right shoulder. No bleeding or tenderness.  Neurological:     Mental Status: He is alert and oriented to person, place, and time.  Psychiatric:        Speech: Speech normal.        Behavior: Behavior normal.        Thought Content: Thought content normal.       Assessment & Plan    1. Actinic keratosis Two lesion  on top and posterior right shoulder that has been present for "a long time", but just noticed by family recently. No pain or bleeding. Hard scale removed and frozen lesion with Cryo-Pen for 90 seconds. May need referral to dermatologist if lesion still present in 10-14 days or returns. May need to consider biopsy for possible squamous cell carcinoma.     Vernie Murders, PA  [facility] Fritzi Mandes McNeil as a Education administrator for [employer].,have documented all relevant documentation on the behalf of [employer],as directed by  [employer] while in the presence of [employer], [place].

## 2019-04-11 LAB — NOVEL CORONAVIRUS, NAA: SARS-CoV-2, NAA: NOT DETECTED

## 2019-05-23 ENCOUNTER — Encounter: Payer: Self-pay | Admitting: Family Medicine

## 2019-05-24 ENCOUNTER — Ambulatory Visit (INDEPENDENT_AMBULATORY_CARE_PROVIDER_SITE_OTHER): Payer: PPO | Admitting: Family Medicine

## 2019-05-24 ENCOUNTER — Other Ambulatory Visit: Payer: Self-pay

## 2019-05-24 ENCOUNTER — Encounter: Payer: Self-pay | Admitting: Family Medicine

## 2019-05-24 VITALS — BP 135/72 | HR 70 | Temp 97.3°F | Resp 16 | Ht 66.0 in | Wt 163.0 lb

## 2019-05-24 DIAGNOSIS — L821 Other seborrheic keratosis: Secondary | ICD-10-CM | POA: Diagnosis not present

## 2019-05-24 NOTE — Progress Notes (Signed)
Patient: Matthew Clayton Male    DOB: 06/21/1926   86 y.o.   MRN: [id number] Visit Date: 05/24/2019  Today's Provider: [name], MD   Chief Complaint  Patient presents with  . Follow-up   Subjective:     HPI   Actinic keratosis From [birth year] 13, 1929-seen by Vernie Murders. Two lesion on top and posterior right shoulder that has been present for "a long time", but just noticed by family recently. No pain or bleeding. Hard scale removed and frozen lesion with Cryo-Pen for 90 seconds. May need referral to dermatologist if lesion still present in 10-14 days or returns. May need to consider biopsy for possible squamous cell carcinoma.  One spot on right shoulder is not better.   No Known Allergies   Current Outpatient Medications:  .  aspirin EC 81 MG tablet, Take 81 mg by mouth daily., Disp: , Rfl:  .  cetirizine (ZYRTEC) 10 MG tablet, Take 1 tablet (10 mg total) by mouth daily., Disp: 30 tablet, Rfl: 11 .  Multiple Vitamins-Minerals (CENTRUM SILVER PO), Take by mouth., Disp: , Rfl:  .  simvastatin (ZOCOR) 40 MG tablet, TAKE 1 TABLET (40 MG TOTAL) BY MOUTH DAILY. NEEDS APPT FOR FURTHER RX., Disp: 90 tablet, Rfl: 3 .  erythromycin ophthalmic ointment, Place 1 application into the left eye 3 (three) times daily. (Patient not taking: Reported on 05/24/2019), Disp: 3.5 g, Rfl: 0 .  fluticasone (FLONASE) 50 MCG/ACT nasal spray, SPRAY 2 SPRAYS INTO EACH NOSTRIL EVERY DAY (Patient not taking: Reported on 05/24/2019), Disp: 48 g, Rfl: 1 .  meloxicam (MOBIC) 7.5 MG tablet, Take 1 tablet (7.5 mg total) by mouth daily. (Patient not taking: Reported on 05/24/2019), Disp: 30 tablet, Rfl: 0 .  predniSONE (STERAPRED UNI-PAK 21 TAB) 10 MG (21) TBPK tablet, Taper as directed. (Patient not taking: Reported on 10/12/2018), Disp: 21 tablet, Rfl: 0  Review of Systems  Constitutional: Negative for appetite change, chills and fever.  Respiratory: Negative for chest tightness, shortness of breath and  wheezing.   Cardiovascular: Negative for chest pain and palpitations.  Gastrointestinal: Negative for abdominal pain, nausea and vomiting.  Skin:       Dime size irritated SK right shoulder.  Psychiatric/Behavioral: Negative.     Social History   Tobacco Use  . Smoking status: Former Smoker    Packs/day: 1.50    Years: 20.00    Pack years: 30.00    Types: Cigarettes    Quit date: 05/16/1960    Years since quitting: 59.0  . Smokeless tobacco: Never Used  . Tobacco comment: quit in 1968  Substance Use Topics  . Alcohol use: No    Alcohol/week: 0.0 standard drinks      Objective:   BP 135/72 (BP Location: Right Arm, Patient Position: Sitting, Cuff Size: Large)   Pulse 70   Temp (!) 97.3 F (36.3 C) (Other (Comment))   Resp 16   Ht 5\' 6"  (1.676 m)   Wt 163 lb (73.9 kg)   SpO2 96%   BMI 26.31 kg/m  Vitals:   05/24/19 1437  BP: 135/72  Pulse: 70  Resp: 16  Temp: (!) 97.3 F (36.3 C)  TempSrc: Other (Comment)  SpO2: 96%  Weight: 163 lb (73.9 kg)  Height: 5\' 6"  (1.676 m)  Body mass index is 26.31 kg/m.   Physical Exam Vitals reviewed.  Constitutional:      Appearance: Normal appearance.  Cardiovascular:     Heart sounds: Normal  heart sounds.  Pulmonary:     Breath sounds: Normal breath sounds.  Abdominal:     Palpations: Abdomen is soft.  Neurological:     General: No focal deficit present.     Mental Status: He is alert and oriented to person, place, and time.  Psychiatric:        Mood and Affect: Mood normal.        Behavior: Behavior normal.        Thought Content: Thought content normal.        Judgment: Judgment normal.      No results found for any visits on 05/24/19.     Assessment & Plan    1. Seborrheic  keratosis Froze lesion with Cryo-Pen on right shoulder.     I,Lydiah Pong,acting as a scribe for [name], MD.,have documented all relevant documentation on the behalf of [name], MD,as directed by  [name], MD while in the presence of [name], MD.      [name], MD  [facility]

## 2019-06-03 ENCOUNTER — Other Ambulatory Visit: Payer: Self-pay | Admitting: Family Medicine

## 2019-06-20 ENCOUNTER — Encounter: Payer: Self-pay | Admitting: Family Medicine

## 2019-06-25 NOTE — Progress Notes (Signed)
Subjective:   Matthew Clayton is a 86 y.o. male who presents for Medicare Annual/Subsequent preventive examination.    This visit is being conducted through telemedicine due to the COVID-19 pandemic. This patient has given me verbal consent via doximity to conduct this visit, patient states they are participating from their home address. Some vital signs may be absent or patient reported.    Patient identification: identified by name, DOB, and current address  Review of Systems:  N/A  Cardiac Risk Factors include: advanced age (>22men, >18 women);dyslipidemia;male gender;hypertension     Objective:    Vitals: There were no vitals taken for this visit.  There is no height or weight on file to calculate BMI. Unable to obtain vitals due to visit being conducted via telephonically.   Advanced Directives 06/26/2019 12/16/2016 12/09/2016 11/04/2016 06/07/2016 12/02/2015  Does Patient Have a Medical Advance Directive? Yes Yes Yes Yes Yes Yes  Type of Paramedic of [place];Living will [place];Living will Living will;Healthcare Power of [place];Living will [place];Living will [place];Living will  Copy of [place] in Chart? No - copy requested No - copy requested - - - -    Tobacco Social History   Tobacco Use  Smoking Status Former Smoker  . Packs/day: 1.50  . Years: 20.00  . Pack years: 30.00  . Types: Cigarettes  . Quit date: 05/16/1960  . Years since quitting: 59.1  Smokeless Tobacco Never Used  Tobacco Comment   quit in 1968     Counseling given: Not Answered Comment: quit in 1968   Clinical Intake:  Pre-visit preparation completed: Yes  Pain : No/denies pain Pain Score: 0-No pain     Nutritional Risks: None Diabetes: No  How often do you need to have someone help you when you read instructions, pamphlets, or other written materials  from your doctor or pharmacy?: 1 - Never  Interpreter Needed?: No  Information entered by :: [facility], LPN  Past Medical History:  Diagnosis Date  . DVT (deep venous thrombosis) (HCC)    left leg  . Hyperlipidemia    Past Surgical History:  Procedure Laterality Date  . APPENDECTOMY    . MOLE REMOVAL    . TONSILLECTOMY AND ADENOIDECTOMY    . TOTAL HIP ARTHROPLASTY Bilateral    Family History  Problem Relation Age of Onset  . Breast cancer Mother   . Cancer Mother        eye cancer  . Heart disease Father   . Breast cancer Sister   . Stroke Brother   . Heart attack Brother   . Breast cancer Sister   . Aneurysm Sister    Social History   Socioeconomic History  . Marital status: Married    Spouse name: Gwinda Passe  . Number of children: 4  . Years of education: bachelors  . Highest education level: Bachelor's degree (e.g., BA, AB, BS)  Occupational History  . Occupation: Retired  Tobacco Use  . Smoking status: Former Smoker    Packs/day: 1.50    Years: 20.00    Pack years: 30.00    Types: Cigarettes    Quit date: 05/16/1960    Years since quitting: 59.1  . Smokeless tobacco: Never Used  . Tobacco comment: quit in 1968  Substance and Sexual Activity  . Alcohol use: No    Alcohol/week: 0.0 standard drinks  . Drug use: No  . Sexual activity:  Not on file  Other Topics Concern  . Not on file  Social History Narrative  . Not on file   Social Determinants of Health   Financial Resource Strain: Low Risk   . Difficulty of Paying Living Expenses: Not hard at all  Food Insecurity: No Food Insecurity  . Worried About Charity fundraiser in the Last Year: Never true  . Ran Out of Food in the Last Year: Never true  Transportation Needs: No Transportation Needs  . Lack of Transportation (Medical): No  . Lack of Transportation (Non-Medical): No  Physical Activity: Inactive  . Days of Exercise per Week: 0 days  . Minutes of Exercise per Session: 0 min  Stress: No  Stress Concern Present  . Feeling of Stress : Not at all  Social Connections: Slightly Isolated  . Frequency of Communication with Friends and Family: More than three times a week  . Frequency of Social Gatherings with Friends and Family: More than three times a week  . Attends Religious Services: More than 4 times per year  . Active Member of Clubs or Organizations: No  . Attends Archivist Meetings: Never  . Marital Status: Married    Outpatient Encounter Medications as of 06/26/2019  Medication Sig  . aspirin EC 81 MG tablet Take 81 mg by mouth daily.  . cetirizine (ZYRTEC) 10 MG tablet Take 1 tablet (10 mg total) by mouth daily.  . Homeopathic Products (LEG CRAMP RELIEF PO) Take by mouth as needed.  . Multiple Vitamins-Minerals (CENTRUM SILVER PO) Take by mouth.  . simvastatin (ZOCOR) 40 MG tablet TAKE 1 TABLET (40 MG TOTAL) BY MOUTH DAILY. NEEDS APPT FOR FURTHER RX.  . erythromycin ophthalmic ointment Place 1 application into the left eye 3 (three) times daily. (Patient not taking: Reported on 05/24/2019)  . fluticasone (FLONASE) 50 MCG/ACT nasal spray SPRAY 2 SPRAYS INTO EACH NOSTRIL EVERY DAY (Patient not taking: Reported on 05/24/2019)  . meloxicam (MOBIC) 7.5 MG tablet Take 1 tablet (7.5 mg total) by mouth daily. (Patient not taking: Reported on 05/24/2019)  . predniSONE (STERAPRED UNI-PAK 21 TAB) 10 MG (21) TBPK tablet Taper as directed. (Patient not taking: Reported on 10/12/2018)   No facility-administered encounter medications on file as of 06/26/2019.    Activities of Daily Living In your present state of health, do you have any difficulty performing the following activities: 06/26/2019  Hearing? 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 Wears bilateral hearing aids.  Vision? N  Difficulty concentrating or making decisions? N  Walking or climbing stairs? N  Dressing or bathing? N  Doing errands, shopping? N  Preparing Food and eating ? N  Using the Toilet? N  In the past six  months, have you accidently leaked urine? N  Do you have problems with loss of bowel control? N  Managing your Medications? N  Managing your Finances? N  Housekeeping or managing your Housekeeping? N  Some recent data might be hidden    Patient Care Team: Jerrol Banana., MD as PCP - General (Family Medicine) Lloyd Huger, MD as Consulting Physician (Oncology)   Assessment:   This is a routine wellness examination for Matthew Clayton.  Exercise Activities and Dietary recommendations Current Exercise Habits: The patient does not participate in regular exercise at present, Exercise limited by: None identified  Goals    . Increase water intake     Recommend increasing water intake to 4 glasses of water a day.  Fall Risk: Fall Risk  06/26/2019 07/05/2018 06/01/2018 01/05/2017 12/16/2016  Falls in the past year? 1 0 1 No No  Number falls in past yr: 0 - 0 - -  Injury with Fall? 0 - 0 - -  Follow up Falls prevention discussed - - - -    FALL RISK PREVENTION PERTAINING TO THE HOME:  Any stairs in or around the home? Yes  If so, are there any without handrails? No   Home free of loose throw rugs in walkways, pet beds, electrical cords, etc? Yes  Adequate lighting in your home to reduce risk of falls? Yes   ASSISTIVE DEVICES UTILIZED TO PREVENT FALLS:  Life alert? No  Use of a cane, walker or w/c? No  Grab bars in the bathroom? Yes  Shower chair or bench in shower? No  Elevated toilet seat or a handicapped toilet? Yes   TIMED UP AND GO:  Was the test performed? No .    Depression Screen PHQ 2/9 Scores 10/12/2018 07/05/2018 06/01/2018 12/16/2016  PHQ - 2 Score 0 0 0 0  PHQ- 9 Score - - - 1    Cognitive Function: Declined today.        Immunization History  Administered Date(s) Administered  . Hepatitis A, Adult 11/25/2015  . Influenza, High Dose Seasonal PF 02/12/2016, 01/05/2017, 03/28/2018, 02/05/2019  . PFIZER SARS-COV-2 Vaccination 05/23/2019, 06/13/2019    . Pneumococcal Conjugate-13 02/12/2016  . Pneumococcal Polysaccharide-23 02/06/2019  . Td 09/15/2012  . Zoster 05/23/2009    Qualifies for Shingles Vaccine? Yes  Zostavax completed 05/23/09. Due for Shingrix. Pt has been advised to call insurance company to determine out of pocket expense. Advised may also receive vaccine at local pharmacy or Health Dept. Verbalized acceptance and understanding.  Tdap: Up to date  Flu Vaccine: Up to date  Pneumococcal Vaccine: Completed series  Screening Tests Health Maintenance  Topic Date Due  . TETANUS/TDAP  09/16/2022  . INFLUENZA VACCINE  Completed  . PNA vac Low Risk Adult  Completed   Cancer Screenings:  Colorectal Screening: No longer required.   Lung Cancer Screening: (Low Dose CT Chest recommended if Age 42-80 years, 30 pack-year currently smoking OR have quit w/in 15years.) does not qualify.   Additional Screening:  Vision Screening: Recommended annual ophthalmology exams for early detection of glaucoma and other disorders of the eye.  Dental Screening: Recommended annual dental exams for proper oral hygiene  Community Resource Referral:  CRR required this visit?  No        Plan:  I have personally reviewed and addressed the Medicare Annual Wellness questionnaire and have noted the following in the patient's chart:  A. Medical and social history B. Use of alcohol, tobacco or illicit drugs  C. Current medications and supplements D. Functional ability and status E.  Nutritional status F.  Physical activity G. Advance directives H. List of other physicians I.  Hospitalizations, surgeries, and ER visits in previous 12 months J.  [place] such as hearing and vision if needed, cognitive and depression L. Referrals and appointments   In addition, I have reviewed and discussed with patient certain preventive protocols, quality metrics, and best practice recommendations. A written personalized care plan for  preventive services as well as general preventive health recommendations were provided to patient.   Glendora Score, LPN  [id number] Nurse Health Advisor   Nurse Notes: None.

## 2019-06-26 ENCOUNTER — Other Ambulatory Visit: Payer: Self-pay

## 2019-06-26 ENCOUNTER — Ambulatory Visit (INDEPENDENT_AMBULATORY_CARE_PROVIDER_SITE_OTHER): Payer: PPO

## 2019-06-26 DIAGNOSIS — Z0000 Encounter for general adult medical examination without abnormal findings: Secondary | ICD-10-CM | POA: Diagnosis not present

## 2019-06-26 NOTE — Patient Instructions (Signed)
Matthew Clayton , Thank you for taking time to come for your Medicare Wellness Visit. I appreciate your ongoing commitment to your health goals. Please review the following plan we discussed and let me know if I can assist you in the future.   Screening recommendations/referrals: Colonoscopy: No longer required.  Recommended yearly ophthalmology/optometry visit for glaucoma screening and checkup Recommended yearly dental visit for hygiene and checkup  Vaccinations: Influenza vaccine: Up to date Pneumococcal vaccine: Completed series Tdap vaccine: Up to date, due 09/2022 Shingles vaccine: Pt declines today.     Advanced directives: Please bring a copy of your POA (Power of Attorney) and/or Living Will to your next appointment.   Conditions/risks identified: Recommend to increase water intake 6-8 8 oz glasses a day.  Next appointment: None. Declined scheduling an follow up with PCP or an AWV for 2022 at this time.   Preventive Care 84 Years and Older, Male Preventive care refers to lifestyle choices and visits with your health care provider that can promote health and wellness. What does preventive care include?  A yearly physical exam. This is also called an annual well check.  Dental exams once or twice a year.  Routine eye exams. Ask your health care provider how often you should have your eyes checked.  Personal lifestyle choices, including:  Daily care of your teeth and gums.  Regular physical activity.  Eating a healthy diet.  Avoiding tobacco and drug use.  Limiting alcohol use.  Practicing safe sex.  Taking low doses of aspirin every day.  Taking vitamin and mineral supplements as recommended by your health care provider. What happens during an annual well check? The services and screenings done by your health care provider during your annual well check will depend on your age, overall health, lifestyle risk factors, and family history of disease. Counseling  Your  health care provider may ask you questions about your:  Alcohol use.  Tobacco use.  Drug use.  Emotional well-being.  Home and relationship well-being.  Sexual activity.  Eating habits.  History of falls.  Memory and ability to understand (cognition).  Work and work Statistician. Screening  You may have the following tests or measurements:  Height, weight, and BMI.  Blood pressure.  Lipid and cholesterol levels. These may be checked every 5 years, or more frequently if you are over 31 years old.  Skin check.  Lung cancer screening. You may have this screening every year starting at age 11 if you have a 30-pack-year history of smoking and currently smoke or have quit within the past 15 years.  Fecal occult blood test (FOBT) of the stool. You may have this test every year starting at age 69.  Flexible sigmoidoscopy or colonoscopy. You may have a sigmoidoscopy every 5 years or a colonoscopy every 10 years starting at age 39.  Prostate cancer screening. Recommendations will vary depending on your family history and other risks.  Hepatitis C blood test.  Hepatitis B blood test.  Sexually transmitted disease (STD) testing.  Diabetes screening. This is done by checking your blood sugar (glucose) after you have not eaten for a while (fasting). You may have this done every 1-3 years.  Abdominal aortic aneurysm (AAA) screening. You may need this if you are a current or former smoker.  Osteoporosis. You may be screened starting at age 19 if you are at high risk. Talk with your health care provider about your test results, treatment options, and if necessary, the need for more tests.  Vaccines  Your health care provider may recommend certain vaccines, such as:  Influenza vaccine. This is recommended every year.  Tetanus, diphtheria, and acellular pertussis (Tdap, Td) vaccine. You may need a Td booster every 10 years.  Zoster vaccine. You may need this after age  35.  Pneumococcal 13-valent conjugate (PCV13) vaccine. One dose is recommended after age 45.  Pneumococcal polysaccharide (PPSV23) vaccine. One dose is recommended after age 34. Talk to your health care provider about which screenings and vaccines you need and how often you need them. This information is not intended to replace advice given to you by your health care provider. Make sure you discuss any questions you have with your health care provider. Document Released: 05/30/2015 Document Revised: 01/21/2016 Document Reviewed: 03/04/2015 Elsevier Interactive Patient Education  2017 [place] Prevention in the Home Falls can cause injuries. They can happen to people of all ages. There are many things you can do to make your home safe and to help prevent falls. What can I do on the outside of my home?  Regularly fix the edges of walkways and driveways and fix any cracks.  Remove anything that might make you trip as you walk through a door, such as a raised step or threshold.  Trim any bushes or trees on the path to your home.  Use bright outdoor lighting.  Clear any walking paths of anything that might make someone trip, such as rocks or tools.  Regularly check to see if handrails are loose or broken. Make sure that both sides of any steps have handrails.  Any raised decks and porches should have guardrails on the edges.  Have any leaves, snow, or ice cleared regularly.  Use sand or salt on walking paths during winter.  Clean up any spills in your garage right away. This includes oil or grease spills. What can I do in the bathroom?  Use night lights.  Install grab bars by the toilet and in the tub and shower. Do not use towel bars as grab bars.  Use non-skid mats or decals in the tub or shower.  If you need to sit down in the shower, use a plastic, non-slip stool.  Keep the floor dry. Clean up any water that spills on the floor as soon as it happens.  Remove  soap buildup in the tub or shower regularly.  Attach bath mats securely with double-sided non-slip rug tape.  Do not have throw rugs and other things on the floor that can make you trip. What can I do in the bedroom?  Use night lights.  Make sure that you have a light by your bed that is easy to reach.  Do not use any sheets or blankets that are too big for your bed. They should not hang down onto the floor.  Have a firm chair that has side arms. You can use this for support while you get dressed.  Do not have throw rugs and other things on the floor that can make you trip. What can I do in the kitchen?  Clean up any spills right away.  Avoid walking on wet floors.  Keep items that you use a lot in easy-to-reach places.  If you need to reach something above you, use a strong step stool that has a grab bar.  Keep electrical cords out of the way.  Do not use floor polish or wax that makes floors slippery. If you must use wax, use non-skid floor wax.  Do not have throw rugs and other things on the floor that can make you trip. What can I do with my stairs?  Do not leave any items on the stairs.  Make sure that there are handrails on both sides of the stairs and use them. Fix handrails that are broken or loose. Make sure that handrails are as long as the stairways.  Check any carpeting to make sure that it is firmly attached to the stairs. Fix any carpet that is loose or worn.  Avoid having throw rugs at the top or bottom of the stairs. If you do have throw rugs, attach them to the floor with carpet tape.  Make sure that you have a light switch at the top of the stairs and the bottom of the stairs. If you do not have them, ask someone to add them for you. What else can I do to help prevent falls?  Wear shoes that:  Do not have high heels.  Have rubber bottoms.  Are comfortable and fit you well.  Are closed at the toe. Do not wear sandals.  If you use a  stepladder:  Make sure that it is fully opened. Do not climb a closed stepladder.  Make sure that both sides of the stepladder are locked into place.  Ask someone to hold it for you, if possible.  Clearly mark and make sure that you can see:  Any grab bars or handrails.  First and last steps.  Where the edge of each step is.  Use tools that help you move around (mobility aids) if they are needed. These include:  Canes.  Walkers.  Scooters.  Crutches.  Turn on the lights when you go into a dark area. Replace any light bulbs as soon as they burn out.  Set up your furniture so you have a clear path. Avoid moving your furniture around.  If any of your floors are uneven, fix them.  If there are any pets around you, be aware of where they are.  Review your medicines with your doctor. Some medicines can make you feel dizzy. This can increase your chance of falling. Ask your doctor what other things that you can do to help prevent falls. This information is not intended to replace advice given to you by your health care provider. Make sure you discuss any questions you have with your health care provider. Document Released: 02/27/2009 Document Revised: 10/09/2015 Document Reviewed: 06/07/2014 Elsevier Interactive Patient Education  2017 [employer].

## 2019-07-24 ENCOUNTER — Telehealth (INDEPENDENT_AMBULATORY_CARE_PROVIDER_SITE_OTHER): Payer: Self-pay | Admitting: Vascular Surgery

## 2019-07-25 ENCOUNTER — Other Ambulatory Visit (INDEPENDENT_AMBULATORY_CARE_PROVIDER_SITE_OTHER): Payer: Self-pay | Admitting: Nurse Practitioner

## 2019-07-25 ENCOUNTER — Other Ambulatory Visit: Payer: Self-pay

## 2019-07-25 ENCOUNTER — Ambulatory Visit (INDEPENDENT_AMBULATORY_CARE_PROVIDER_SITE_OTHER): Payer: PPO

## 2019-07-25 DIAGNOSIS — M79662 Pain in left lower leg: Secondary | ICD-10-CM

## 2019-07-25 DIAGNOSIS — M7989 Other specified soft tissue disorders: Secondary | ICD-10-CM

## 2019-07-25 NOTE — Telephone Encounter (Signed)
Patients that have had DVTs in the past can have swelling after, especially if they haven't been wearing compression on a regular basis and has been sitting dependent on a regular basis.  We can have the patient come in for a DVT only study.  If it is negative he should elevate his legs and wear compression.  If it is positive we will work him in to be seen.

## 2019-07-25 NOTE — Telephone Encounter (Signed)
I call the pt and and passed along the information from the NP I also made him aware that he would be contacted to set up a DVT only study.

## 2019-07-25 NOTE — Telephone Encounter (Signed)
LMVM for patient to call and schedule

## 2019-08-28 ENCOUNTER — Other Ambulatory Visit: Payer: Self-pay | Admitting: Family Medicine

## 2019-08-28 NOTE — Telephone Encounter (Signed)
I contacted patient because last time he was seen to address chronic problem was in 2019 last lipids checked was 2019. I scheduled appointment for patient to follow up with you for 09/12/19. Please review over request below for refill. KW

## 2019-08-28 NOTE — Telephone Encounter (Signed)
Requested medications are due for refill today?  Yes  Requested medications are on active medication list?  Yes  Last Refill:   06/04/2019  # 90 with no refills - with notation that patient would need an office visit for further refills.    Future visit scheduled? No  Notes to Clinic:  Medication failed RX refill protocol due to no required labs per protocol within 360 days.  Last labs performed on 03/28/2018.

## 2019-08-28 NOTE — Telephone Encounter (Signed)
Last lipids 03/2018

## 2019-09-07 NOTE — Progress Notes (Signed)
Established patient visit  I,Matthew Clayton,acting as a scribe for [name], MD.,have documented all relevant documentation on the behalf of [name], MD,as directed by  [name], MD while in the presence of [name], MD.   Patient: Matthew Clayton   DOB: 29-Sep-1926   86 y.o. Male  MRN: [id number] Visit Date: 09/12/2019  Today's healthcare provider: [name], MD   Chief Complaint  Patient presents with  . Follow-up  . Hyperlipidemia   Subjective    HPI  Patient would like to have annual physical exam today.  We will do this.  Overall he feels well.  He is a little more unsteady on his feet than in the past but this has been a very gradual process.  No recent falls. Lipid/Cholesterol, Follow-up  Last lipid panel Other pertinent labs  Lab Results  Component Value Date   CHOL 153 03/28/2018   HDL 51 03/28/2018   LDLCALC 79 03/28/2018   TRIG 113 03/28/2018   CHOLHDL 3.0 03/28/2018   Lab Results  Component Value Date   ALT 18 03/28/2018   AST 22 03/28/2018   PLT 220 03/28/2018   TSH 1.850 03/28/2018     He was last seen for this 1 years ago.  Management since that visit includes; labs checked showing-stable.  He reports good compliance with treatment. He is not having side effects. none Current diet: well balanced Current exercise: none  Wt Readings from Last 3 Encounters:  09/12/19 161 lb (73 kg)  05/24/19 163 lb (73.9 kg)  04/10/19 165 lb 9.6 oz (75.1 kg)   The ASCVD Risk score Mikey Bussing DC Jr., et al., 2013) failed to calculate for the following reasons:   The 2013 ASCVD risk score is only valid for ages 53 to 14  --------------------------------------------------------------------  Past Medical History:  Diagnosis Date  . DVT (deep venous thrombosis) (HCC)    left leg  . Hyperlipidemia        Medications: Outpatient Medications Prior to Visit  Medication Sig  . aspirin EC 81 MG tablet Take 81 mg by mouth  daily.  . cetirizine (ZYRTEC) 10 MG tablet Take 1 tablet (10 mg total) by mouth daily.  . Multiple Vitamins-Minerals (CENTRUM SILVER PO) Take by mouth.  . simvastatin (ZOCOR) 40 MG tablet TAKE 1 TABLET (40 MG TOTAL) BY MOUTH DAILY. NEEDS APPT FOR FURTHER RX.  . erythromycin ophthalmic ointment Place 1 application into the left eye 3 (three) times daily. (Patient not taking: Reported on 05/24/2019)  . fluticasone (FLONASE) 50 MCG/ACT nasal spray SPRAY 2 SPRAYS INTO EACH NOSTRIL EVERY DAY (Patient not taking: Reported on 05/24/2019)  . Homeopathic Products (LEG CRAMP RELIEF PO) Take by mouth as needed.  . meloxicam (MOBIC) 7.5 MG tablet Take 1 tablet (7.5 mg total) by mouth daily. (Patient not taking: Reported on 05/24/2019)  . predniSONE (STERAPRED UNI-PAK 21 TAB) 10 MG (21) TBPK tablet Taper as directed. (Patient not taking: Reported on 10/12/2018)   No facility-administered medications prior to visit.    Review of Systems  Constitutional: Negative for appetite change, chills and fever.  HENT: Positive for postnasal drip.        One of the few complaints he has had some thick postnasal drainage at times.  Eyes: Negative.   Respiratory: Negative for chest tightness, shortness of breath and wheezing.   Cardiovascular: Negative for chest pain and palpitations.  Gastrointestinal: Negative for abdominal pain, nausea and vomiting.  Endocrine: Negative.  Genitourinary: Negative.   Musculoskeletal: Positive for gait problem.  Allergic/Immunologic: Negative.   Hematological: Negative.   Psychiatric/Behavioral: Negative.         Objective    BP 108/67 (BP Location: Left Arm, Patient Position: Sitting, Cuff Size: Large)   Pulse 74   Temp (!) 97.1 F (36.2 C) (Other (Comment))   Resp 18   Ht 5\' 6"  (1.676 m)   Wt 161 lb (73 kg)   SpO2 96%   BMI 25.99 kg/m  BP Readings from Last 3 Encounters:  09/12/19 108/67  05/24/19 135/72  04/10/19 134/80   Wt Readings from Last 3 Encounters:   09/12/19 161 lb (73 kg)  05/24/19 163 lb (73.9 kg)  04/10/19 165 lb 9.6 oz (75.1 kg)      Physical Exam Vitals reviewed.  Constitutional:      Appearance: Normal appearance. He is well-developed.  HENT:     Head: Normocephalic and atraumatic.     Right Ear: Tympanic membrane and external ear normal.     Left Ear: Tympanic membrane and external ear normal.     Nose: Nose normal.     Mouth/Throat:     Mouth: Mucous membranes are moist.     Pharynx: Oropharynx is clear.  Eyes:     General: No scleral icterus.    Conjunctiva/sclera: Conjunctivae normal.  Neck:     Thyroid: No thyromegaly.     Vascular: No carotid bruit.  Cardiovascular:     Rate and Rhythm: Normal rate and regular rhythm.     Heart sounds: Normal heart sounds.  Pulmonary:     Effort: Pulmonary effort is normal.     Breath sounds: Normal breath sounds.  Abdominal:     Palpations: Abdomen is soft.  Genitourinary:    Comments: This exam deferred at age 5 after discussion with patient. Lymphadenopathy:     Cervical: No cervical adenopathy.  Skin:    General: Skin is warm and dry.  Neurological:     General: No focal deficit present.     Mental Status: He is alert and oriented to person, place, and time.  Psychiatric:        Mood and Affect: Mood normal.        Behavior: Behavior normal.        Thought Content: Thought content normal.        Judgment: Judgment normal.       No results found for any visits on 09/12/19.  Assessment & Plan    1. Annual physical exam Patient asked about PSA and I informed him that 90 it would not be an appropriate test order.  He is having no GU symptoms. Overall health is good.  Discussed being active and minimizing fall risk.  2. Mixed hyperlipidemia On Zocor 40 - CBC w/Diff/Platelet - Comprehensive Metabolic Panel (CMET) - Lipid panel - TSH  3. Essential hypertension No medications. - CBC w/Diff/Platelet - Comprehensive Metabolic Panel (CMET) - Lipid  panel - TSH  4. Seasonal allergic rhinitis due to pollen We will add Robitussin twice a day for his postnasal drainage. - CBC w/Diff/Platelet - Comprehensive Metabolic Panel (CMET) - Lipid panel - TSH  5. Acute deep vein thrombosis (DVT) of femoral vein of left lower extremity (HCC) Remote history of DVT after travel.  Has chronic mild swelling in the left leg due to this  6. Pure hypercholesterolemia   7. Decreased hearing of both ears Hearing aids   Return in about 6 months (around 03/13/2020).  Naviah Belfield Cranford Mon, MD  [facility] [phone] (phone) [phone] (fax)  [place]

## 2019-09-12 ENCOUNTER — Encounter: Payer: Self-pay | Admitting: Family Medicine

## 2019-09-12 ENCOUNTER — Ambulatory Visit (INDEPENDENT_AMBULATORY_CARE_PROVIDER_SITE_OTHER): Payer: PPO | Admitting: Family Medicine

## 2019-09-12 ENCOUNTER — Other Ambulatory Visit: Payer: Self-pay

## 2019-09-12 VITALS — BP 108/67 | HR 74 | Temp 97.1°F | Resp 18 | Ht 66.0 in | Wt 161.0 lb

## 2019-09-12 DIAGNOSIS — I10 Essential (primary) hypertension: Secondary | ICD-10-CM

## 2019-09-12 DIAGNOSIS — I82412 Acute embolism and thrombosis of left femoral vein: Secondary | ICD-10-CM

## 2019-09-12 DIAGNOSIS — J301 Allergic rhinitis due to pollen: Secondary | ICD-10-CM | POA: Diagnosis not present

## 2019-09-12 DIAGNOSIS — E782 Mixed hyperlipidemia: Secondary | ICD-10-CM | POA: Diagnosis not present

## 2019-09-12 DIAGNOSIS — Z0000 Encounter for general adult medical examination without abnormal findings: Secondary | ICD-10-CM | POA: Diagnosis not present

## 2019-09-12 DIAGNOSIS — E7800 Pure hypercholesterolemia, unspecified: Secondary | ICD-10-CM

## 2019-09-12 DIAGNOSIS — H9193 Unspecified hearing loss, bilateral: Secondary | ICD-10-CM

## 2019-09-12 NOTE — Patient Instructions (Signed)
Try over-the-counter Robitussin two times daily.

## 2019-09-13 DIAGNOSIS — I10 Essential (primary) hypertension: Secondary | ICD-10-CM | POA: Diagnosis not present

## 2019-09-13 DIAGNOSIS — E782 Mixed hyperlipidemia: Secondary | ICD-10-CM | POA: Diagnosis not present

## 2019-09-13 DIAGNOSIS — J301 Allergic rhinitis due to pollen: Secondary | ICD-10-CM | POA: Diagnosis not present

## 2019-09-14 LAB — TSH: TSH: 1.98 u[IU]/mL (ref 0.450–4.500)

## 2019-09-14 LAB — CBC WITH DIFFERENTIAL/PLATELET
Basophils Absolute: 0.1 10*3/uL (ref 0.0–0.2)
Basos: 1 %
EOS (ABSOLUTE): 0.4 10*3/uL (ref 0.0–0.4)
Eos: 5 %
Hematocrit: 42.6 % (ref 37.5–51.0)
Hemoglobin: 14.2 g/dL (ref 13.0–17.7)
Immature Grans (Abs): 0 10*3/uL (ref 0.0–0.1)
Immature Granulocytes: 0 %
Lymphocytes Absolute: 1.9 10*3/uL (ref 0.7–3.1)
Lymphs: 24 %
MCH: 32.6 pg (ref 26.6–33.0)
MCHC: 33.3 g/dL (ref 31.5–35.7)
MCV: 98 fL — ABNORMAL HIGH (ref 79–97)
Monocytes Absolute: 0.6 10*3/uL (ref 0.1–0.9)
Monocytes: 7 %
Neutrophils Absolute: 4.8 10*3/uL (ref 1.4–7.0)
Neutrophils: 63 %
Platelets: 203 10*3/uL (ref 150–450)
RBC: 4.35 x10E6/uL (ref 4.14–5.80)
RDW: 13 % (ref 11.6–15.4)
WBC: 7.8 10*3/uL (ref 3.4–10.8)

## 2019-09-14 LAB — COMPREHENSIVE METABOLIC PANEL
ALT: 17 IU/L (ref 0–44)
AST: 20 IU/L (ref 0–40)
Albumin/Globulin Ratio: 1.3 (ref 1.2–2.2)
Albumin: 4.3 g/dL (ref 3.5–4.6)
Alkaline Phosphatase: 77 IU/L (ref 39–117)
BUN/Creatinine Ratio: 13 (ref 10–24)
BUN: 19 mg/dL (ref 10–36)
Bilirubin Total: 0.4 mg/dL (ref 0.0–1.2)
CO2: 21 mmol/L (ref 20–29)
Calcium: 10 mg/dL (ref 8.6–10.2)
Chloride: 105 mmol/L (ref 96–106)
Creatinine, Ser: 1.5 mg/dL — ABNORMAL HIGH (ref 0.76–1.27)
GFR calc Af Amer: 47 mL/min/{1.73_m2} — ABNORMAL LOW (ref >59)
GFR calc non Af Amer: 40 mL/min/{1.73_m2} — ABNORMAL LOW (ref >59)
Globulin, Total: 3.2 g/dL (ref 1.5–4.5)
Glucose: 68 mg/dL (ref 65–99)
Potassium: 4.5 mmol/L (ref 3.5–5.2)
Sodium: 141 mmol/L (ref 134–144)
Total Protein: 7.5 g/dL (ref 6.0–8.5)

## 2019-09-14 LAB — LIPID PANEL
Chol/HDL Ratio: 2.4 ratio (ref 0.0–5.0)
Cholesterol, Total: 132 mg/dL (ref 100–199)
HDL: 55 mg/dL (ref >39)
LDL Chol Calc (NIH): 57 mg/dL (ref 0–99)
Triglycerides: 111 mg/dL (ref 0–149)
VLDL Cholesterol Cal: 20 mg/dL (ref 5–40)

## 2019-10-18 ENCOUNTER — Ambulatory Visit (INDEPENDENT_AMBULATORY_CARE_PROVIDER_SITE_OTHER): Payer: PPO | Admitting: Family Medicine

## 2019-10-18 ENCOUNTER — Telehealth: Payer: Self-pay

## 2019-10-18 DIAGNOSIS — Z20822 Contact with and (suspected) exposure to covid-19: Secondary | ICD-10-CM

## 2019-10-18 NOTE — Progress Notes (Signed)
Trena Platt Rolinda Impson,acting as a scribe for [name], MD.,have documented all relevant documentation on the behalf of [name], MD,as directed by  [name], MD while in the presence of [name], MD. Virtual telephone visit    Virtual Visit via Telephone Note   This visit type was conducted due to national recommendations for restrictions regarding the COVID-19 Pandemic (e.g. social distancing) in an effort to limit this patient's exposure and mitigate transmission in our community. Due to his co-morbid illnesses, this patient is at least at moderate risk for complications without adequate follow up. This format is felt to be most appropriate for this patient at this time. The patient did not have access to video technology or had technical difficulties with video requiring transitioning to audio format only (telephone). Physical exam was limited to content and character of the telephone converstion.    Patient location: home Provider location: office   Visit Date: 10/18/2019  Today's healthcare provider: [name], MD   Chief Complaint  Patient presents with  . Covid Exposure   Subjective    HPI  Patient would like to discuss with pcp, Covid and covid exposure. Patient says that he has a few questions and would like Dr. Alben Spittle advise.  Patient is fully vaccinated and is going to the beach with multiple other family members who are fully vaccinated there is 1 family member who has not been vaccinated but he had Covid a couple of months ago.     Medications: Outpatient Medications Prior to Visit  Medication Sig  . aspirin EC 81 MG tablet Take 81 mg by mouth daily.  . cetirizine (ZYRTEC) 10 MG tablet Take 1 tablet (10 mg total) by mouth daily.  Marland Kitchen erythromycin ophthalmic ointment Place 1 application into the left eye 3 (three) times daily. (Patient not taking: Reported on 05/24/2019)  . fluticasone (FLONASE) 50 MCG/ACT nasal spray  SPRAY 2 SPRAYS INTO EACH NOSTRIL EVERY DAY (Patient not taking: Reported on 05/24/2019)  . Homeopathic Products (LEG CRAMP RELIEF PO) Take by mouth as needed.  . meloxicam (MOBIC) 7.5 MG tablet Take 1 tablet (7.5 mg total) by mouth daily. (Patient not taking: Reported on 05/24/2019)  . Multiple Vitamins-Minerals (CENTRUM SILVER PO) Take by mouth.  . predniSONE (STERAPRED UNI-PAK 21 TAB) 10 MG (21) TBPK tablet Taper as directed. (Patient not taking: Reported on 10/12/2018)  . simvastatin (ZOCOR) 40 MG tablet TAKE 1 TABLET (40 MG TOTAL) BY MOUTH DAILY. NEEDS APPT FOR FURTHER RX.   No facility-administered medications prior to visit.    Review of Systems     Objective    There were no vitals taken for this visit.      Assessment & Plan     1. Contact with and (suspected) exposure to covid-19 Fully vaccinated patient advised he can safely go to the beach in the above described conditions with family.  Per CDC guidelines this is a safe visit can be during today's pandemic.     No follow-ups on file.    I discussed the assessment and treatment plan with the patient. The patient was provided an opportunity to ask questions and all were answered. The patient agreed with the plan and demonstrated an understanding of the instructions.   The patient was advised to call back or seek an in-person evaluation if the symptoms worsen or if the condition fails to improve as anticipated.  I provided 10 minutes of non-face-to-face time during this encounter.  Richard Cranford Mon, MD [facility] [phone] (phone) [phone] (fax)  [place]

## 2019-10-18 NOTE — Telephone Encounter (Signed)
ok 

## 2019-10-18 NOTE — Telephone Encounter (Signed)
Copied from [place] [id number]. Topic: General - Other >> Oct 18, 2019  8:59 AM Matthew Clayton wrote: Reason for CRM: Patient set an appointment with Dr Rosanna Randy for this afternoon. He can not make it in the office or do Clayton virtual visit so he asked for Clayton phone call visit. Please advise, Ph# [phone]

## 2019-11-14 DIAGNOSIS — H25813 Combined forms of age-related cataract, bilateral: Secondary | ICD-10-CM | POA: Diagnosis not present

## 2019-11-28 ENCOUNTER — Other Ambulatory Visit: Payer: Self-pay | Admitting: Family Medicine

## 2019-11-28 NOTE — Telephone Encounter (Signed)
Requested Prescriptions  Pending Prescriptions Disp Refills  . simvastatin (ZOCOR) 40 MG tablet [Pharmacy Med Name: SIMVASTATIN 40 MG TABLET] 90 tablet 0    Sig: TAKE 1 TABLET (40 MG TOTAL) BY MOUTH DAILY. NEEDS APPT FOR FURTHER RX.     Cardiovascular:  Antilipid - Statins Failed - 11/28/2019  1:20 AM      Failed - LDL in normal range and within 360 days    LDL Chol Calc (NIH)  Date Value Ref Range Status  09/13/2019 57 0 - 99 mg/dL Final         Passed - Total Cholesterol in normal range and within 360 days    Cholesterol, Total  Date Value Ref Range Status  09/13/2019 132 100 - 199 mg/dL Final         Passed - HDL in normal range and within 360 days    HDL  Date Value Ref Range Status  09/13/2019 55 >39 mg/dL Final         Passed - Triglycerides in normal range and within 360 days    Triglycerides  Date Value Ref Range Status  09/13/2019 111 0 - 149 mg/dL Final         Passed - Patient is not pregnant      Passed - Valid encounter within last 12 months    Recent Outpatient Visits          1 month ago Contact with and (suspected) exposure to [place] Jerrol Banana., MD   2 months ago Annual physical exam   [facility] Jerrol Banana., MD   6 months ago [place] Jerrol Banana., MD   7 months ago Actinic keratosis   [facility] [place], [place]ki Muff, [place]   11 months ago    [place], Retia Passe., MD      Future Appointments            In 3 months Jerrol Banana., MD [facility], [place]

## 2019-12-07 DIAGNOSIS — I499 Cardiac arrhythmia, unspecified: Secondary | ICD-10-CM | POA: Diagnosis not present

## 2019-12-07 DIAGNOSIS — R001 Bradycardia, unspecified: Secondary | ICD-10-CM | POA: Diagnosis not present

## 2019-12-10 ENCOUNTER — Telehealth: Payer: Self-pay

## 2019-12-10 NOTE — Telephone Encounter (Signed)
Copied from [place] [id number]. Topic: General - Inquiry >> Dec 10, 2019  2:23 PM Hinda Lenis D wrote: PT asking if is ok for him to get a covid booster shot, he was vaccinated back in January / please advise

## 2019-12-11 NOTE — Telephone Encounter (Signed)
If he has had both vaccines there is no COVID vaccine booster recommended at this time

## 2019-12-12 NOTE — Telephone Encounter (Signed)
Patient advised.

## 2020-02-01 ENCOUNTER — Encounter: Payer: Self-pay | Admitting: Family Medicine

## 2020-02-05 ENCOUNTER — Encounter: Payer: Self-pay | Admitting: Family Medicine

## 2020-02-18 ENCOUNTER — Telehealth (INDEPENDENT_AMBULATORY_CARE_PROVIDER_SITE_OTHER): Payer: Self-pay | Admitting: Vascular Surgery

## 2020-02-18 NOTE — Telephone Encounter (Signed)
Per Eulogio Ditch NP patient can be schedule to seen with GS or FB

## 2020-02-18 NOTE — Telephone Encounter (Addendum)
Patient called stating that he isnt having any issues but would like to come in for a follow up visit with GS. Patient was last seen 05-05-17 with le ven dvt studies. Please advise.

## 2020-02-18 NOTE — Telephone Encounter (Signed)
Scheduled patient

## 2020-02-25 ENCOUNTER — Other Ambulatory Visit (INDEPENDENT_AMBULATORY_CARE_PROVIDER_SITE_OTHER): Payer: Self-pay | Admitting: Vascular Surgery

## 2020-02-25 ENCOUNTER — Other Ambulatory Visit (INDEPENDENT_AMBULATORY_CARE_PROVIDER_SITE_OTHER): Payer: PPO

## 2020-02-25 ENCOUNTER — Encounter (INDEPENDENT_AMBULATORY_CARE_PROVIDER_SITE_OTHER): Payer: Self-pay | Admitting: Vascular Surgery

## 2020-02-25 ENCOUNTER — Other Ambulatory Visit: Payer: Self-pay

## 2020-02-25 ENCOUNTER — Ambulatory Visit (INDEPENDENT_AMBULATORY_CARE_PROVIDER_SITE_OTHER): Payer: PPO | Admitting: Vascular Surgery

## 2020-02-25 VITALS — BP 120/60 | HR 54 | Resp 12 | Ht 66.0 in | Wt 163.0 lb

## 2020-02-25 DIAGNOSIS — E782 Mixed hyperlipidemia: Secondary | ICD-10-CM

## 2020-02-25 DIAGNOSIS — R600 Localized edema: Secondary | ICD-10-CM | POA: Diagnosis not present

## 2020-02-25 DIAGNOSIS — M7989 Other specified soft tissue disorders: Secondary | ICD-10-CM

## 2020-02-25 DIAGNOSIS — Z86718 Personal history of other venous thrombosis and embolism: Secondary | ICD-10-CM

## 2020-02-25 DIAGNOSIS — I10 Essential (primary) hypertension: Secondary | ICD-10-CM | POA: Diagnosis not present

## 2020-02-28 ENCOUNTER — Ambulatory Visit (INDEPENDENT_AMBULATORY_CARE_PROVIDER_SITE_OTHER): Payer: PPO | Admitting: Vascular Surgery

## 2020-03-01 ENCOUNTER — Encounter (INDEPENDENT_AMBULATORY_CARE_PROVIDER_SITE_OTHER): Payer: Self-pay | Admitting: Vascular Surgery

## 2020-03-01 NOTE — Progress Notes (Signed)
MRN : [id number]  Matthew Clayton is a 86 y.o. (June 03, 1926) male who presents with chief complaint of  Chief Complaint  Patient presents with  . Follow-up    no studies  .  History of Present Illness:   Patient called asking to be seen today for increased pain and swelling of his leg.  This began abruptly .    Duplex ultrasound is - for DVT  Current Meds  Medication Sig  . aspirin EC 81 MG tablet Take 81 mg by mouth daily.  . cetirizine (ZYRTEC) 10 MG tablet Take 1 tablet (10 mg total) by mouth daily.  . Homeopathic Products (LEG CRAMP RELIEF PO) Take by mouth as needed.  . Multiple Vitamins-Minerals (CENTRUM SILVER PO) Take by mouth.  . simvastatin (ZOCOR) 40 MG tablet TAKE 1 TABLET (40 MG TOTAL) BY MOUTH DAILY. NEEDS APPT FOR FURTHER RX.    Past Medical History:  Diagnosis Date  . DVT (deep venous thrombosis) (HCC)    left leg  . Hyperlipidemia     Past Surgical History:  Procedure Laterality Date  . APPENDECTOMY    . MOLE REMOVAL    . TONSILLECTOMY AND ADENOIDECTOMY    . TOTAL HIP ARTHROPLASTY Bilateral     Social History Social History   Tobacco Use  . Smoking status: Former Smoker    Packs/day: 1.50    Years: 20.00    Pack years: 30.00    Types: Cigarettes    Quit date: 05/16/1960    Years since quitting: 59.8  . Smokeless tobacco: Never Used  . Tobacco comment: quit in 1968  Substance Use Topics  . Alcohol use: No    Alcohol/week: 0.0 standard drinks  . Drug use: No    Family History Family History  Problem Relation Age of Onset  . Breast cancer Mother   . Cancer Mother        eye cancer  . Heart disease Father   . Breast cancer Sister   . Stroke Brother   . Heart attack Brother   . Breast cancer Sister   . Aneurysm Sister     No Known Allergies   REVIEW OF SYSTEMS (Negative unless checked)  Constitutional: [] Weight loss  [] Fever  [] Chills Cardiac: [] Chest pain   [] Chest pressure   [] Palpitations   [] Shortness of breath when  laying flat   [] Shortness of breath with exertion. Vascular:  [] Pain in legs with walking   [] Pain in legs at rest  [x] History of DVT   [] Phlebitis   [] Swelling in legs   [] Varicose veins   [] Non-healing ulcers Pulmonary:   [] Uses home oxygen   [] Productive cough   [] Hemoptysis   [] Wheeze  [] COPD   [] Asthma Neurologic:  [] Dizziness   [] Seizures   [] History of stroke   [] History of TIA  [] Aphasia   [] Vissual changes   [] Weakness or numbness in arm   [] Weakness or numbness in leg Musculoskeletal:   [] Joint swelling   [] Joint pain   [] Low back pain Hematologic:  [] Easy bruising  [] Easy bleeding   [] Hypercoagulable state   [] Anemic Gastrointestinal:  [] Diarrhea   [] Vomiting  [] Gastroesophageal reflux/heartburn   [] Difficulty swallowing. Genitourinary:  [] Chronic kidney disease   [] Difficult urination  [] Frequent urination   [] Blood in urine Skin:  [] Rashes   [] Ulcers  Psychological:  [] History of anxiety   []  History of major depression.  Physical Examination  Vitals:   02/25/20 1459  BP: 120/60  Pulse: (!) 54  Resp: 12  Weight: 163 lb (73.9 kg)  Height: 5\' 6"  (1.676 m)   Body mass index is 26.31 kg/m. Gen: WD/WN, NAD Head: [place]/AT, No temporalis wasting.  Ear/Nose/Throat: Hearing grossly intact, nares w/o erythema or drainage Eyes: PER, EOMI, sclera nonicteric.  Neck: Supple, no large masses.   Pulmonary:  Good air movement, no audible wheezing bilaterally, no use of accessory muscles.  Cardiac: RRR, no JVD Vascular: scattered varicosities present bilaterally.  Mild venous stasis changes to the legs bilaterally.  2+ soft pitting edema Vessel Right Left  Radial Palpable Palpable  Gastrointestinal: Non-distended. No guarding/no peritoneal signs.  Musculoskeletal: M/S 5/5 throughout.  No deformity or atrophy.  Neurologic: CN 2-12 intact. Symmetrical.  Speech is fluent. Motor exam as listed above. Psychiatric: Judgment intact, Mood & affect appropriate for pt's clinical  situation. Dermatologic: Mild rashes no ulcers noted.  No changes consistent with cellulitis. Lymph : No lichenification or skin changes of chronic lymphedema.  CBC Lab Results  Component Value Date   WBC 7.8 09/13/2019   HGB 14.2 09/13/2019   HCT 42.6 09/13/2019   MCV 98 (H) 09/13/2019   PLT 203 09/13/2019    BMET    Component Value Date/Time   NA 141 09/13/2019 1141   K 4.5 09/13/2019 1141   CL 105 09/13/2019 1141   CO2 21 09/13/2019 1141   GLUCOSE 68 09/13/2019 1141   GLUCOSE 112 (H) 05/17/2016 1249   BUN 19 09/13/2019 1141   CREATININE 1.50 (H) 09/13/2019 1141   CALCIUM 10.0 09/13/2019 1141   GFRNONAA 40 (L) 09/13/2019 1141   GFRAA 47 (L) 09/13/2019 1141   CrCl cannot be calculated (Patient's most recent lab result is older than the maximum 21 days allowed.).  COAG No results found for: INR, PROTIME  Radiology VAS [place] LOWER EXTREMITY VENOUS (DVT)  Result Date: 02/25/2020  Lower Venous DVTStudy Indications: Swelling, and left.  Performing Technologist: Concha [place] RVT  Examination Guidelines: A complete evaluation includes B-mode imaging, spectral Doppler, color Doppler, and power Doppler as needed of all accessible portions of each vessel. Bilateral testing is considered an integral part of a complete examination. Limited examinations for reoccurring indications may be performed as noted. The reflux portion of the exam is performed with the patient in reverse Trendelenburg.  +-----+---------------+---------+-----------+----------+--------------+ RIGHTCompressibilityPhasicitySpontaneityPropertiesThrombus Aging +-----+---------------+---------+-----------+----------+--------------+ CFV  Full           Yes      Yes                                 +-----+---------------+---------+-----------+----------+--------------+   +---------+---------------+---------+-----------+---------------+-------------+ LEFT     CompressibilityPhasicitySpontaneityProperties      Thrombus                                                                 Aging         +---------+---------------+---------+-----------+---------------+-------------+ CFV      Full           Yes      Yes                                     +---------+---------------+---------+-----------+---------------+-------------+ SFJ  Full           Yes      Yes                                     +---------+---------------+---------+-----------+---------------+-------------+ FV Prox  Full           Yes      Yes                                     +---------+---------------+---------+-----------+---------------+-------------+ FV Mid   Full           Yes      Yes                                     +---------+---------------+---------+-----------+---------------+-------------+ FV DistalFull           Yes      Yes                                     +---------+---------------+---------+-----------+---------------+-------------+ PFV      Full           Yes      Yes                                     +---------+---------------+---------+-----------+---------------+-------------+ POP      Partial        Yes      Yes        brightly                                                                 echogenic                    +---------+---------------+---------+-----------+---------------+-------------+ PTV      Full           Yes      Yes                                     +---------+---------------+---------+-----------+---------------+-------------+ PERO     Full           Yes      Yes                                     +---------+---------------+---------+-----------+---------------+-------------+ GSV      Full           Yes      Yes                                     +---------+---------------+---------+-----------+---------------+-------------+ SSV      Full  Yes      Yes                                      +---------+---------------+---------+-----------+---------------+-------------+     Summary: RIGHT: - No evidence of common femoral vein obstruction.  LEFT: - Findings consistent with chronic deep vein thrombosis involving the left popliteal vein. - No acute DVT process seen. There is significant deep vein reflux seen. There is chronic appearing minmal DVT in the popliteal vein.  *See table(s) above for measurements and observations. Electronically signed by Hortencia Pilar MD on 02/25/2020 at 4:50:42 PM.    Final      Assessment/Plan 1. History of DVT (deep vein thrombosis) Recommend:   No surgery or intervention at this point in time.  IVC filter is not indicated at present.  Patient's duplex ultrasound of the venous system is negative for DVT  Elevation was stressed, use of a recliner was discussed.  I have reviewed my discussion with the patient regarding DVT and post phlebitic changes such as swelling and why it  causes symptoms such as pain.  The patient will wear graduated compression stockings class 1 (20-30 mmHg), beginning after three full days of anticoagulation, on a daily basis a prescription was given. The patient will  beginning wearing the stockings first thing in the morning and removing them in the evening. The patient is instructed specifically not to sleep in the stockings.  In addition, behavioral modification including elevation during the day and avoidance of prolonged dependency will be initiated.    The patient will continue anticoagulation for now as there have not been any problems or complications at this point.    2. Mixed hyperlipidemia Continue statin as ordered and reviewed, no changes at this time   3. Essential hypertension Continue antihypertensive medications as already ordered, these medications have been reviewed and there are no changes at this time.     Hortencia Pilar, MD  03/01/2020 11:01 AM

## 2020-03-18 ENCOUNTER — Other Ambulatory Visit: Payer: Self-pay

## 2020-03-18 ENCOUNTER — Encounter: Payer: Self-pay | Admitting: Family Medicine

## 2020-03-18 ENCOUNTER — Ambulatory Visit (INDEPENDENT_AMBULATORY_CARE_PROVIDER_SITE_OTHER): Payer: PPO | Admitting: Family Medicine

## 2020-03-18 VITALS — BP 104/68 | HR 66 | Temp 97.5°F | Resp 18 | Ht 66.0 in | Wt 166.0 lb

## 2020-03-18 DIAGNOSIS — I10 Essential (primary) hypertension: Secondary | ICD-10-CM

## 2020-03-18 DIAGNOSIS — Z86718 Personal history of other venous thrombosis and embolism: Secondary | ICD-10-CM

## 2020-03-18 DIAGNOSIS — E782 Mixed hyperlipidemia: Secondary | ICD-10-CM

## 2020-03-18 NOTE — Progress Notes (Signed)
I,[name],acting as a scribe for [name], MD.,have documented all relevant documentation on the behalf of [name], MD,as directed by  [name], MD while in the presence of [name], MD.   Established patient visit   Patient: Matthew Clayton   DOB: 04/11/1927   86 y.o. Male  MRN: [id number] Visit Date: 03/18/2020  Today's healthcare provider: [name], MD   Chief Complaint  Patient presents with  . Cough  . Follow-up  . Hyperlipidemia   Subjective    HPI is tolerating all his medications well.  Last LDL was 57 on Zocor. Patient feels well and has no complaints.  He has had Covid vaccines. Lipid/Cholesterol, follow-up  Last Lipid Panel: Lab Results  Component Value Date   CHOL 132 09/13/2019   LDLCALC 57 09/13/2019   HDL 55 09/13/2019   TRIG 111 09/13/2019    He was last seen for this 6 months ago.  Management since that visit includes; labs checked showing-stable. Make sure to stay hydrated.  He reports good compliance with treatment. He is not having side effects. none He is following a Regular, Low Sodium diet. Current exercise: none  Last metabolic panel Lab Results  Component Value Date   GLUCOSE 68 09/13/2019   NA 141 09/13/2019   K 4.5 09/13/2019   BUN 19 09/13/2019   CREATININE 1.50 (H) 09/13/2019   GFRNONAA 40 (L) 09/13/2019   GFRAA 47 (L) 09/13/2019   CALCIUM 10.0 09/13/2019   AST 20 09/13/2019   ALT 17 09/13/2019   The ASCVD Risk score Mikey Bussing DC Jr., et al., 2013) failed to calculate for the following reasons:   The 2013 ASCVD risk score is only valid for ages 69 to 16  -------------------------------------------------------------------- Chronic cough Patient states antihistamines are not helping.      Medications: Outpatient Medications Prior to Visit  Medication Sig  . aspirin EC 81 MG tablet Take 81 mg by mouth daily.  . cetirizine (ZYRTEC) 10 MG tablet Take 1 tablet (10 mg total)  by mouth daily.  . Homeopathic Products (LEG CRAMP RELIEF PO) Take by mouth as needed.  . Multiple Vitamins-Minerals (CENTRUM SILVER PO) Take by mouth.  . simvastatin (ZOCOR) 40 MG tablet TAKE 1 TABLET (40 MG TOTAL) BY MOUTH DAILY. NEEDS APPT FOR FURTHER RX.  . erythromycin ophthalmic ointment Place 1 application into the left eye 3 (three) times daily. (Patient not taking: Reported on 05/24/2019)  . fluticasone (FLONASE) 50 MCG/ACT nasal spray SPRAY 2 SPRAYS INTO EACH NOSTRIL EVERY DAY (Patient not taking: Reported on 05/24/2019)  . meloxicam (MOBIC) 7.5 MG tablet Take 1 tablet (7.5 mg total) by mouth daily. (Patient not taking: Reported on 05/24/2019)  . predniSONE (STERAPRED UNI-PAK 21 TAB) 10 MG (21) TBPK tablet Taper as directed. (Patient not taking: Reported on 03/18/2020)   No facility-administered medications prior to visit.    Review of Systems  Constitutional: Negative for appetite change, chills and fever.  Respiratory: Negative for chest tightness, shortness of breath and wheezing.   Cardiovascular: Negative for chest pain and palpitations.  Gastrointestinal: Negative for abdominal pain, nausea and vomiting.       Objective    BP 104/68 (BP Location: Right Arm, Patient Position: Sitting, Cuff Size: Large)   Pulse 66   Temp (!) 97.5 F (36.4 C) (Oral)   Resp 18   Ht 5\' 6"  (1.676 m)   Wt 166 lb (75.3 kg)   SpO2 98%  BMI 26.79 kg/m  BP Readings from Last 3 Encounters:  03/18/20 104/68  02/25/20 120/60  09/12/19 108/67   Wt Readings from Last 3 Encounters:  03/18/20 166 lb (75.3 kg)  02/25/20 163 lb (73.9 kg)  09/12/19 161 lb (73 kg)      Physical Exam Vitals reviewed.  Constitutional:      Appearance: Normal appearance. He is well-developed.  HENT:     Head: Normocephalic and atraumatic.     Right Ear: Tympanic membrane and external ear normal.     Left Ear: Tympanic membrane and external ear normal.     Nose: Nose normal.     Mouth/Throat:     Mouth: Mucous  membranes are moist.     Pharynx: Oropharynx is clear.  Eyes:     General: No scleral icterus.    Conjunctiva/sclera: Conjunctivae normal.  Neck:     Thyroid: No thyromegaly.     Vascular: No carotid bruit.  Cardiovascular:     Rate and Rhythm: Normal rate and regular rhythm.     Heart sounds: Normal heart sounds.  Pulmonary:     Effort: Pulmonary effort is normal.     Breath sounds: Normal breath sounds.  Abdominal:     Palpations: Abdomen is soft.  Lymphadenopathy:     Cervical: No cervical adenopathy.  Skin:    General: Skin is warm and dry.  Neurological:     General: No focal deficit present.     Mental Status: He is alert and oriented to person, place, and time.  Psychiatric:        Mood and Affect: Mood normal.        Behavior: Behavior normal.        Thought Content: Thought content normal.        Judgment: Judgment normal.       No results found for any visits on 03/18/20.  Assessment & Plan     1. Essential hypertension Excellent control.  No signs of hypotension.  2. Mixed hyperlipidemia Controlled on Zocor  3. History of DVT (deep vein thrombosis) This was a provoked DVT after a long plane flight.  Doing well.  For physical in 6 months.   No follow-ups on file.         Conley Pawling Cranford Mon, MD  [facility] [phone] (phone) [phone] (fax)  [place]

## 2020-06-25 NOTE — Progress Notes (Unsigned)
Subjective:   Matthew Clayton is a 87 y.o. male who presents for Medicare Annual/Subsequent preventive examination.  I connected with Burnett Corrente today by telephone and verified that I am speaking with the correct person using two identifiers. Location patient: home Location provider: work Persons participating in the virtual visit: patient, provider.   I discussed the limitations, risks, security and privacy concerns of performing an evaluation and management service by telephone and the availability of in person appointments. I also discussed with the patient that there may be a patient responsible charge related to this service. The patient expressed understanding and verbally consented to this telephonic visit.    Interactive audio and video telecommunications were attempted between this provider and patient, however failed, due to patient having technical difficulties OR patient did not have access to video capability.  We continued and completed visit with audio only.   Review of Systems    N/A  Cardiac Risk Factors include: advanced age (>83men, >75 women);male gender;dyslipidemia     Objective:    There were no vitals filed for this visit. There is no height or weight on file to calculate BMI.  Advanced Directives 06/26/2020 06/26/2019 12/16/2016 12/09/2016 11/04/2016 06/07/2016 12/02/2015  Does Patient Have a Medical Advance Directive? Yes Yes Yes Yes Yes Yes Yes  Type of Paramedic of ;Living will [place];Living will [place];Living will Living will;Healthcare Power of [place];Living will [place];Living will [place];Living will  Copy of [place] in Chart? No - copy requested No - copy requested No - copy requested - - - -    Current Medications (verified) Outpatient Encounter Medications as of 06/26/2020  Medication Sig   . aspirin EC 81 MG tablet Take 81 mg by mouth daily.  . cetirizine (ZYRTEC) 10 MG tablet Take 1 tablet (10 mg total) by mouth daily.  . Multiple Vitamins-Minerals (CENTRUM SILVER PO) Take by mouth daily at 6 (six) AM.  . erythromycin ophthalmic ointment Place 1 application into the left eye 3 (three) times daily. (Patient not taking: No sig reported)  . fluticasone (FLONASE) 50 MCG/ACT nasal spray SPRAY 2 SPRAYS INTO EACH NOSTRIL EVERY DAY (Patient not taking: No sig reported)  . Homeopathic Products (LEG CRAMP RELIEF PO) Take by mouth as needed. (Patient not taking: Reported on 06/26/2020)  . meloxicam (MOBIC) 7.5 MG tablet Take 1 tablet (7.5 mg total) by mouth daily. (Patient not taking: No sig reported)  . predniSONE (STERAPRED UNI-PAK 21 TAB) 10 MG (21) TBPK tablet Taper as directed. (Patient not taking: No sig reported)  . simvastatin (ZOCOR) 40 MG tablet TAKE 1 TABLET (40 MG TOTAL) BY MOUTH DAILY. NEEDS APPT FOR FURTHER RX.   No facility-administered encounter medications on file as of 06/26/2020.    Allergies (verified) Patient has no known allergies.   History: Past Medical History:  Diagnosis Date  . DVT (deep venous thrombosis) (HCC)    left leg  . Hyperlipidemia    Past Surgical History:  Procedure Laterality Date  . APPENDECTOMY    . MOLE REMOVAL    . TONSILLECTOMY AND ADENOIDECTOMY    . TOTAL HIP ARTHROPLASTY Bilateral    Family History  Problem Relation Age of Onset  . Breast cancer Mother   . Cancer Mother        eye cancer  . Heart disease Father   . Breast cancer Sister   . Stroke  Brother   . Heart attack Brother   . Breast cancer Sister   . Aneurysm Sister    Social History   Socioeconomic History  . Marital status: Married    Spouse name: Gwinda Passe  . Number of children: 4  . Years of education: bachelors  . Highest education level: Bachelor's degree (e.g., BA, AB, BS)  Occupational History  . Occupation: Retired  Tobacco Use  . Smoking status:  Former Smoker    Packs/day: 1.50    Years: 20.00    Pack years: 30.00    Types: Cigarettes    Quit date: 05/16/1960    Years since quitting: 60.1  . Smokeless tobacco: Never Used  . Tobacco comment: quit in 1968  Substance and Sexual Activity  . Alcohol use: No    Alcohol/week: 0.0 standard drinks  . Drug use: No  . Sexual activity: Not on file  Other Topics Concern  . Not on file  Social History Narrative  . Not on file   Social Determinants of Health   Financial Resource Strain: Low Risk   . Difficulty of Paying Living Expenses: Not hard at all  Food Insecurity: No Food Insecurity  . Worried About Charity fundraiser in the Last Year: Never true  . Ran Out of Food in the Last Year: Never true  Transportation Needs: No Transportation Needs  . Lack of Transportation (Medical): No  . Lack of Transportation (Non-Medical): No  Physical Activity: Inactive  . Days of Exercise per Week: 0 days  . Minutes of Exercise per Session: 0 min  Stress: No Stress Concern Present  . Feeling of Stress : Not at all  Social Connections: Socially Integrated  . Frequency of Communication with Friends and Family: More than three times a week  . Frequency of Social Gatherings with Friends and Family: More than three times a week  . Attends Religious Services: More than 4 times per year  . Active Member of Clubs or Organizations: Yes  . Attends Archivist Meetings: More than 4 times per year  . Marital Status: Married    Tobacco Counseling Counseling given: Not Answered Comment: quit in 1968   Clinical Intake:  Pre-visit preparation completed: Yes  Pain : No/denies pain     Nutritional Risks: None Diabetes: No  How often do you need to have someone help you when you read instructions, pamphlets, or other written materials from your doctor or pharmacy?: 1 - Never  Diabetic? No  Interpreter Needed?: No  Information entered by :: [facility], LPN   Activities of Daily  Living In your present state of health, do you have any difficulty performing the following activities: 06/26/2020 03/18/2020  Hearing? N Y  Comment Wears bilateral hearing aids. -  Vision? N N  Comment Wears eye glasses. -  Difficulty concentrating or making decisions? N N  Walking or climbing stairs? N N  Dressing or bathing? N N  Doing errands, shopping? N N  Preparing Food and eating ? N -  Using the Toilet? N -  In the past six months, have you accidently leaked urine? N -  Do you have problems with loss of bowel control? N -  Managing your Medications? N -  Managing your Finances? N -  Housekeeping or managing your Housekeeping? N -  Some recent data might be hidden    Patient Care Team: Jerrol Banana., MD as PCP - General (Family Medicine) Schnier, Dolores Lory, MD (Vascular Surgery) Gloriann Loan,  Crista Curb., MD (Ophthalmology)  Indicate any recent Medical Services you may have received from other than [facility] providers in the past year (date may be approximate).     Assessment:   This is a routine wellness examination for [place].  Hearing/Vision screen No exam data present  Dietary issues and exercise activities discussed: Current Exercise Habits: The patient does not participate in regular exercise at present, Exercise limited by: None identified  Goals    . Increase water intake     Recommend increasing water intake to 4 glasses of water a day.       Depression Screen PHQ 2/9 Scores 06/26/2020 03/18/2020 10/12/2018 07/05/2018 06/01/2018 12/16/2016 12/16/2016  PHQ - 2 Score 0 0 0 0 0 0 0  PHQ- 9 Score - 0 - - - 1 -    Fall Risk Fall Risk  06/26/2020 03/18/2020 06/26/2019 07/05/2018 06/01/2018  Falls in the past year? 1 0 1 0 1  Number falls in past yr: 0 0 0 - 0  Injury with Fall? 0 0 0 - 0  Follow up Falls prevention discussed Falls evaluation completed Falls prevention discussed - -    FALL RISK PREVENTION PERTAINING TO THE HOME:  Any stairs in or around the home? Yes   If so, are there any without handrails? No  Home free of loose throw rugs in walkways, pet beds, electrical cords, etc? Yes  Adequate lighting in your home to reduce risk of falls? Yes   ASSISTIVE DEVICES UTILIZED TO PREVENT FALLS:  Life alert? No  Use of a cane, walker or w/c? No  Grab bars in the bathroom? Yes  Shower chair or bench in shower? No  Elevated toilet seat or a handicapped toilet? Yes    Cognitive Function: Normal cognitive status assessed by observation by this Nurse Health Advisor. No abnormalities found.         Immunizations Immunization History  Administered Date(s) Administered  . Hepatitis A, Adult 11/25/2015  . Influenza, High Dose Seasonal PF 02/12/2016, 01/05/2017, 03/28/2018, 02/05/2019  . Influenza-Unspecified 01/28/2020  . PFIZER(Purple Top)SARS-COV-2 Vaccination 05/23/2019, 06/13/2019, 01/21/2020  . Pneumococcal Conjugate-13 02/12/2016  . Pneumococcal Polysaccharide-23 02/06/2019  . Td 09/15/2012  . Zoster 05/23/2009    TDAP status: Up to date  Flu Vaccine status: Up to date  Pneumococcal vaccine status: Up to date  Covid-19 vaccine status: Completed vaccines  Qualifies for Shingles Vaccine? Yes   Zostavax completed Yes   Shingrix Completed?: No.    Education has been provided regarding the importance of this vaccine. Patient has been advised to call insurance company to determine out of pocket expense if they have not yet received this vaccine. Advised may also receive vaccine at local pharmacy or Health Dept. Verbalized acceptance and understanding.  Screening Tests Health Maintenance  Topic Date Due  . COVID-19 Vaccine (4 - Booster for Pfizer series) 07/20/2020  . TETANUS/TDAP  09/16/2022  . INFLUENZA VACCINE  Completed  . PNA vac Low Risk Adult  Completed    Health Maintenance  There are no preventive care reminders to display for this patient.  Colorectal cancer screening: No longer required.   Lung Cancer Screening: (Low  Dose CT Chest recommended if Age 35-80 years, 30 pack-year currently smoking OR have quit w/in 15years.) does not qualify.   Additional Screening:  Vision Screening: Recommended annual ophthalmology exams for early detection of glaucoma and other disorders of the eye. Is the patient up to date with their annual eye exam?  Yes  Who is the provider or what is the name of the office in which the patient attends annual eye exams? Dr Gloriann Loan If pt is not established with a provider, would they like to be referred to a provider to establish care? No .   Dental Screening: Recommended annual dental exams for proper oral hygiene  Community Resource Referral / Chronic Care Management: CRR required this visit?  No   CCM required this visit?  No      Plan:     I have personally reviewed and noted the following in the patient's chart:   . Medical and social history . Use of alcohol, tobacco or illicit drugs  . Current medications and supplements . Functional ability and status . Nutritional status . Physical activity . Advanced directives . List of other physicians . Hospitalizations, surgeries, and ER visits in previous 12 months . Vitals . Screenings to include cognitive, depression, and falls . Referrals and appointments  In addition, I have reviewed and discussed with patient certain preventive protocols, quality metrics, and best practice recommendations. A written personalized care plan for preventive services as well as general preventive health recommendations were provided to patient.     Mariusz Jubb Brent, [place]   [id number]   Nurse Notes: None.

## 2020-06-26 ENCOUNTER — Ambulatory Visit (INDEPENDENT_AMBULATORY_CARE_PROVIDER_SITE_OTHER): Payer: PPO

## 2020-06-26 ENCOUNTER — Other Ambulatory Visit: Payer: Self-pay

## 2020-06-26 DIAGNOSIS — Z0000 Encounter for general adult medical examination without abnormal findings: Secondary | ICD-10-CM

## 2020-06-26 NOTE — Patient Instructions (Signed)
Matthew Clayton , Thank you for taking time to come for your Medicare Wellness Visit. I appreciate your ongoing commitment to your health goals. Please review the following plan we discussed and let me know if I can assist you in the future.   Screening recommendations/referrals: Colonoscopy: No longer required.  Recommended yearly ophthalmology/optometry visit for glaucoma screening and checkup Recommended yearly dental visit for hygiene and checkup  Vaccinations: Influenza vaccine: Done 01/28/20 Pneumococcal vaccine: Completed series Tdap vaccine: Up to date, due 09/2022 Shingles vaccine: Shingrix discussed. Please contact your pharmacy for coverage information.   Advanced directives: Please bring a copy of your POA (Power of Attorney) and/or Living Will to your next appointment.   Conditions/risks identified: Recommend increasing water intake to 6-8 8 oz glasses a day.   Next appointment: 09/18/20 @ 2:00 PM with Dr Rosanna Randy   Preventive Care 28 Years and Older, Male Preventive care refers to lifestyle choices and visits with your health care provider that can promote health and wellness. What does preventive care include?  A yearly physical exam. This is also called an annual well check.  Dental exams once or twice a year.  Routine eye exams. Ask your health care provider how often you should have your eyes checked.  Personal lifestyle choices, including:  Daily care of your teeth and gums.  Regular physical activity.  Eating a healthy diet.  Avoiding tobacco and drug use.  Limiting alcohol use.  Practicing safe sex.  Taking low doses of aspirin every day.  Taking vitamin and mineral supplements as recommended by your health care provider. What happens during an annual well check? The services and screenings done by your health care provider during your annual well check will depend on your age, overall health, lifestyle risk factors, and family history of disease. Counseling   Your health care provider may ask you questions about your:  Alcohol use.  Tobacco use.  Drug use.  Emotional well-being.  Home and relationship well-being.  Sexual activity.  Eating habits.  History of falls.  Memory and ability to understand (cognition).  Work and work Statistician. Screening  You may have the following tests or measurements:  Height, weight, and BMI.  Blood pressure.  Lipid and cholesterol levels. These may be checked every 5 years, or more frequently if you are over 63 years old.  Skin check.  Lung cancer screening. You may have this screening every year starting at age 73 if you have a 30-pack-year history of smoking and currently smoke or have quit within the past 15 years.  Fecal occult blood test (FOBT) of the stool. You may have this test every year starting at age 3.  Flexible sigmoidoscopy or colonoscopy. You may have a sigmoidoscopy every 5 years or a colonoscopy every 10 years starting at age 30.  Prostate cancer screening. Recommendations will vary depending on your family history and other risks.  Hepatitis C blood test.  Hepatitis B blood test.  Sexually transmitted disease (STD) testing.  Diabetes screening. This is done by checking your blood sugar (glucose) after you have not eaten for a while (fasting). You may have this done every 1-3 years.  Abdominal aortic aneurysm (AAA) screening. You may need this if you are a current or former smoker.  Osteoporosis. You may be screened starting at age 74 if you are at high risk. Talk with your health care provider about your test results, treatment options, and if necessary, the need for more tests. Vaccines  Your health care  provider may recommend certain vaccines, such as:  Influenza vaccine. This is recommended every year.  Tetanus, diphtheria, and acellular pertussis (Tdap, Td) vaccine. You may need a Td booster every 10 years.  Zoster vaccine. You may need this after age  8.  Pneumococcal 13-valent conjugate (PCV13) vaccine. One dose is recommended after age 63.  Pneumococcal polysaccharide (PPSV23) vaccine. One dose is recommended after age 31. Talk to your health care provider about which screenings and vaccines you need and how often you need them. This information is not intended to replace advice given to you by your health care provider. Make sure you discuss any questions you have with your health care provider. Document Released: 05/30/2015 Document Revised: 01/21/2016 Document Reviewed: 03/04/2015 Elsevier Interactive Patient Education  2017 [place] Prevention in the Home Falls can cause injuries. They can happen to people of all ages. There are many things you can do to make your home safe and to help prevent falls. What can I do on the outside of my home?  Regularly fix the edges of walkways and driveways and fix any cracks.  Remove anything that might make you trip as you walk through a door, such as a raised step or threshold.  Trim any bushes or trees on the path to your home.  Use bright outdoor lighting.  Clear any walking paths of anything that might make someone trip, such as rocks or tools.  Regularly check to see if handrails are loose or broken. Make sure that both sides of any steps have handrails.  Any raised decks and porches should have guardrails on the edges.  Have any leaves, snow, or ice cleared regularly.  Use sand or salt on walking paths during winter.  Clean up any spills in your garage right away. This includes oil or grease spills. What can I do in the bathroom?  Use night lights.  Install grab bars by the toilet and in the tub and shower. Do not use towel bars as grab bars.  Use non-skid mats or decals in the tub or shower.  If you need to sit down in the shower, use a plastic, non-slip stool.  Keep the floor dry. Clean up any water that spills on the floor as soon as it happens.  Remove  soap buildup in the tub or shower regularly.  Attach bath mats securely with double-sided non-slip rug tape.  Do not have throw rugs and other things on the floor that can make you trip. What can I do in the bedroom?  Use night lights.  Make sure that you have a light by your bed that is easy to reach.  Do not use any sheets or blankets that are too big for your bed. They should not hang down onto the floor.  Have a firm chair that has side arms. You can use this for support while you get dressed.  Do not have throw rugs and other things on the floor that can make you trip. What can I do in the kitchen?  Clean up any spills right away.  Avoid walking on wet floors.  Keep items that you use a lot in easy-to-reach places.  If you need to reach something above you, use a strong step stool that has a grab bar.  Keep electrical cords out of the way.  Do not use floor polish or wax that makes floors slippery. If you must use wax, use non-skid floor wax.  Do not have throw  rugs and other things on the floor that can make you trip. What can I do with my stairs?  Do not leave any items on the stairs.  Make sure that there are handrails on both sides of the stairs and use them. Fix handrails that are broken or loose. Make sure that handrails are as long as the stairways.  Check any carpeting to make sure that it is firmly attached to the stairs. Fix any carpet that is loose or worn.  Avoid having throw rugs at the top or bottom of the stairs. If you do have throw rugs, attach them to the floor with carpet tape.  Make sure that you have a light switch at the top of the stairs and the bottom of the stairs. If you do not have them, ask someone to add them for you. What else can I do to help prevent falls?  Wear shoes that:  Do not have high heels.  Have rubber bottoms.  Are comfortable and fit you well.  Are closed at the toe. Do not wear sandals.  If you use a  stepladder:  Make sure that it is fully opened. Do not climb a closed stepladder.  Make sure that both sides of the stepladder are locked into place.  Ask someone to hold it for you, if possible.  Clearly mark and make sure that you can see:  Any grab bars or handrails.  First and last steps.  Where the edge of each step is.  Use tools that help you move around (mobility aids) if they are needed. These include:  Canes.  Walkers.  Scooters.  Crutches.  Turn on the lights when you go into a dark area. Replace any light bulbs as soon as they burn out.  Set up your furniture so you have a clear path. Avoid moving your furniture around.  If any of your floors are uneven, fix them.  If there are any pets around you, be aware of where they are.  Review your medicines with your doctor. Some medicines can make you feel dizzy. This can increase your chance of falling. Ask your doctor what other things that you can do to help prevent falls. This information is not intended to replace advice given to you by your health care provider. Make sure you discuss any questions you have with your health care provider. Document Released: 02/27/2009 Document Revised: 10/09/2015 Document Reviewed: 06/07/2014 Elsevier Interactive Patient Education  2017 [employer].

## 2020-08-12 NOTE — Progress Notes (Signed)
This encounter was created in error - please disregard.

## 2020-08-19 ENCOUNTER — Encounter: Payer: Self-pay | Admitting: Family Medicine

## 2020-09-18 ENCOUNTER — Ambulatory Visit (INDEPENDENT_AMBULATORY_CARE_PROVIDER_SITE_OTHER): Payer: PPO | Admitting: Family Medicine

## 2020-09-18 ENCOUNTER — Encounter: Payer: Self-pay | Admitting: Family Medicine

## 2020-09-18 ENCOUNTER — Other Ambulatory Visit: Payer: Self-pay

## 2020-09-18 VITALS — BP 102/55 | HR 66 | Temp 97.9°F | Resp 18 | Ht 66.0 in | Wt 163.0 lb

## 2020-09-18 DIAGNOSIS — I10 Essential (primary) hypertension: Secondary | ICD-10-CM

## 2020-09-18 DIAGNOSIS — Z0000 Encounter for general adult medical examination without abnormal findings: Secondary | ICD-10-CM

## 2020-09-18 DIAGNOSIS — E782 Mixed hyperlipidemia: Secondary | ICD-10-CM

## 2020-09-18 DIAGNOSIS — R001 Bradycardia, unspecified: Secondary | ICD-10-CM | POA: Diagnosis not present

## 2020-09-18 NOTE — Progress Notes (Signed)
I,[name],acting as a scribe for [name], MD.,have documented all relevant documentation on the behalf of [name], MD,as directed by  [name], MD while in the presence of [name], MD.   Complete physical exam   Patient: Matthew Clayton   DOB: 03-Sep-1926   87 y.o. Male  MRN: [id number] Visit Date: 09/18/2020  Today's healthcare provider: [name], MD   Chief Complaint  Patient presents with  . Annual Exam   Subjective    Matthew Clayton is a 87 y.o. male who presents today for a complete physical exam.  He reports consuming a general diet. Home exercise routine includes walking and yard work. He generally feels well. He reports sleeping well. He does not have additional problems to discuss today.  HPI  Patient had AWV with [facility] on 06/26/2020.  Past Medical History:  Diagnosis Date  . DVT (deep venous thrombosis) (HCC)    left leg  . Hyperlipidemia    Past Surgical History:  Procedure Laterality Date  . APPENDECTOMY    . MOLE REMOVAL    . TONSILLECTOMY AND ADENOIDECTOMY    . TOTAL HIP ARTHROPLASTY Bilateral    Social History   Socioeconomic History  . Marital status: Married    Spouse name: Gwinda Passe  . Number of children: 4  . Years of education: bachelors  . Highest education level: Bachelor's degree (e.g., BA, AB, BS)  Occupational History  . Occupation: Retired  Tobacco Use  . Smoking status: Former Smoker    Packs/day: 1.50    Years: 20.00    Pack years: 30.00    Types: Cigarettes    Quit date: 05/16/1960    Years since quitting: 60.3  . Smokeless tobacco: Never Used  . Tobacco comment: quit in 1968  Substance and Sexual Activity  . Alcohol use: No    Alcohol/week: 0.0 standard drinks  . Drug use: No  . Sexual activity: Not on file  Other Topics Concern  . Not on file  Social History Narrative  . Not on file   Social Determinants of Health   Financial Resource Strain: Low Risk   . Difficulty of  Paying Living Expenses: Not hard at all  Food Insecurity: No Food Insecurity  . Worried About Charity fundraiser in the Last Year: Never true  . Ran Out of Food in the Last Year: Never true  Transportation Needs: No Transportation Needs  . Lack of Transportation (Medical): No  . Lack of Transportation (Non-Medical): No  Physical Activity: Inactive  . Days of Exercise per Week: 0 days  . Minutes of Exercise per Session: 0 min  Stress: No Stress Concern Present  . Feeling of Stress : Not at all  Social Connections: Socially Integrated  . Frequency of Communication with Friends and Family: More than three times a week  . Frequency of Social Gatherings with Friends and Family: More than three times a week  . Attends Religious Services: More than 4 times per year  . Active Member of Clubs or Organizations: Yes  . Attends Archivist Meetings: More than 4 times per year  . Marital Status: Married  Human resources officer Violence: Not At Risk  . Fear of Current or Ex-Partner: No  . Emotionally Abused: No  . Physically Abused: No  . Sexually Abused: No   Family Status  Relation Name Status  . Mother  Deceased at age 40  . Father  Deceased at  age 76  . Sister  Deceased  . Brother  Deceased  . Sister  Deceased  . Sister  Alive  . MGM  Deceased  . MGF  Deceased  . PGM  Deceased  . PGF  Deceased   Family History  Problem Relation Age of Onset  . Breast cancer Mother   . Cancer Mother        eye cancer  . Heart disease Father   . Breast cancer Sister   . Stroke Brother   . Heart attack Brother   . Breast cancer Sister   . Aneurysm Sister    No Known Allergies  Patient Care Team: Jerrol Banana., MD as PCP - General (Family Medicine) Schnier, Dolores Lory, MD (Vascular Surgery) Lorelee Cover., MD (Ophthalmology)   Medications: Outpatient Medications Prior to Visit  Medication Sig  . aspirin EC 81 MG tablet Take 81 mg by mouth daily.  . cetirizine (ZYRTEC) 10 MG  tablet Take 1 tablet (10 mg total) by mouth daily.  . Multiple Vitamins-Minerals (CENTRUM SILVER PO) Take by mouth daily at 6 (six) AM.  . simvastatin (ZOCOR) 40 MG tablet TAKE 1 TABLET (40 MG TOTAL) BY MOUTH DAILY. NEEDS APPT FOR FURTHER RX.  . erythromycin ophthalmic ointment Place 1 application into the left eye 3 (three) times daily. (Patient not taking: No sig reported)  . fluticasone (FLONASE) 50 MCG/ACT nasal spray SPRAY 2 SPRAYS INTO EACH NOSTRIL EVERY DAY (Patient not taking: No sig reported)  . Homeopathic Products (LEG CRAMP RELIEF PO) Take by mouth as needed. (Patient not taking: No sig reported)  . meloxicam (MOBIC) 7.5 MG tablet Take 1 tablet (7.5 mg total) by mouth daily. (Patient not taking: No sig reported)  . predniSONE (STERAPRED UNI-PAK 21 TAB) 10 MG (21) TBPK tablet Taper as directed. (Patient not taking: No sig reported)   No facility-administered medications prior to visit.    Review of Systems  Constitutional: Positive for activity change.  HENT: Positive for hearing loss.   Musculoskeletal: Positive for back pain.  Neurological: Positive for numbness.  Hematological: Bruises/bleeds easily.  All other systems reviewed and are negative.      Objective    BP (!) 102/55 (BP Location: Right Arm, Patient Position: Sitting, Cuff Size: Normal)   Pulse 66   Temp 97.9 F (36.6 C) (Oral)   Resp 18   Ht 5\' 6"  (1.676 m)   Wt 163 lb (73.9 kg)   SpO2 96%   BMI 26.31 kg/m  BP Readings from Last 3 Encounters:  09/18/20 (!) 102/55  03/18/20 104/68  02/25/20 120/60   Wt Readings from Last 3 Encounters:  09/18/20 163 lb (73.9 kg)  03/18/20 166 lb (75.3 kg)  02/25/20 163 lb (73.9 kg)      Physical Exam Vitals reviewed.  Constitutional:      Appearance: Normal appearance. He is well-developed.  HENT:     Head: Normocephalic and atraumatic.     Right Ear: Tympanic membrane and external ear normal.     Left Ear: Tympanic membrane and external ear normal.      Nose: Nose normal.     Mouth/Throat:     Mouth: Mucous membranes are moist.     Pharynx: Oropharynx is clear.  Eyes:     General: No scleral icterus.    Conjunctiva/sclera: Conjunctivae normal.  Neck:     Thyroid: No thyromegaly.     Vascular: No carotid bruit.  Cardiovascular:     Rate and  Rhythm: Normal rate and regular rhythm.     Heart sounds: Murmur heard.      Comments: 2 Over 6 systolic murmur Pulmonary:     Effort: Pulmonary effort is normal.     Breath sounds: Normal breath sounds.  Abdominal:     Palpations: Abdomen is soft.  Genitourinary:    Comments: This exam deferred at age 56 after discussion with patient. Musculoskeletal:     Comments: Arthritic changes of hands.  Especially notable in the MCPs.  Lymphadenopathy:     Cervical: No cervical adenopathy.  Skin:    General: Skin is warm and dry.  Neurological:     General: No focal deficit present.     Mental Status: He is alert and oriented to person, place, and time.  Psychiatric:        Mood and Affect: Mood normal.        Behavior: Behavior normal.        Thought Content: Thought content normal.        Judgment: Judgment normal.     ECG reveals significant sinus bradycardia rate of about 45.  About the same as a couple of years ago.  No ischemic changes.  Last depression screening scores PHQ 2/9 Scores 09/18/2020 06/26/2020 03/18/2020  PHQ - 2 Score 0 0 0  PHQ- 9 Score 0 - 0   Last fall risk screening Fall Risk  09/18/2020  Falls in the past year? 0  Number falls in past yr: 0  Injury with Fall? 0  Follow up Falls evaluation completed   Last Audit-C alcohol use screening Alcohol Use Disorder Test (AUDIT) 09/18/2020  1. How often do you have a drink containing alcohol? 0  2. How many drinks containing alcohol do you have on a typical day when you are drinking? 0  3. How often do you have six or more drinks on one occasion? 0  AUDIT-C Score 0  Alcohol Brief Interventions/Follow-up -   A score of 3 or  more in women, and 4 or more in men indicates increased risk for alcohol abuse, EXCEPT if all of the points are from question 1   No results found for any visits on 09/18/20.  Assessment & Plan    Routine Health Maintenance and Physical Exam  Exercise Activities and Dietary recommendations Goals    . Increase water intake     Recommend increasing water intake to 4 glasses of water a day.        Immunization History  Administered Date(s) Administered  . Hepatitis A, Adult 11/25/2015  . Influenza, High Dose Seasonal PF 02/12/2016, 01/05/2017, 03/28/2018, 02/05/2019  . Influenza-Unspecified 01/28/2020  . PFIZER Comirnaty(Gray Top)Covid-19 Tri-Sucrose Vaccine 08/15/2020  . PFIZER(Purple Top)SARS-COV-2 Vaccination 05/23/2019, 06/13/2019, 01/28/2020  . Pneumococcal Conjugate-13 02/12/2016  . Pneumococcal Polysaccharide-23 02/06/2019  . Td 09/15/2012  . Zoster 05/23/2009    Health Maintenance  Topic Date Due  . INFLUENZA VACCINE  12/15/2020  . TETANUS/TDAP  09/16/2022  . COVID-19 Vaccine  Completed  . PNA vac Low Risk Adult  Completed  . HPV VACCINES  Aged Out    Discussed health benefits of physical activity, and encouraged him to engage in regular exercise appropriate for his age and condition.  1. Annual physical exam   2. Essential hypertension  - TSH - CBC w/Diff/Platelet - Comprehensive Metabolic Panel (CMET)  3. Mixed hyperlipidemia  - Lipid panel - TSH - CBC w/Diff/Platelet - Comprehensive Metabolic Panel (CMET)  4. Bradycardia Symptomatic bradycardia in this  87 year old.  Refer back to cardiology to make sure no further work-up is needed. - EKG 12-Lead - Ambulatory referral to Cardiology    Return in about 6 months (around 03/21/2021).     I, [name], MD, have reviewed all documentation for this visit. The documentation on 09/21/20 for the exam, diagnosis, procedures, and orders are all accurate and complete.    Chelsea Pedretti Cranford Mon, MD   [facility] [phone] (phone) [phone] (fax)  [place]

## 2020-09-19 LAB — CBC WITH DIFFERENTIAL/PLATELET
Basophils Absolute: 0.1 10*3/uL (ref 0.0–0.2)
Basos: 1 %
EOS (ABSOLUTE): 0.3 10*3/uL (ref 0.0–0.4)
Eos: 4 %
Hematocrit: 39.4 % (ref 37.5–51.0)
Hemoglobin: 13.1 g/dL (ref 13.0–17.7)
Immature Grans (Abs): 0 10*3/uL (ref 0.0–0.1)
Immature Granulocytes: 0 %
Lymphocytes Absolute: 1.8 10*3/uL (ref 0.7–3.1)
Lymphs: 21 %
MCH: 32 pg (ref 26.6–33.0)
MCHC: 33.2 g/dL (ref 31.5–35.7)
MCV: 96 fL (ref 79–97)
Monocytes Absolute: 0.6 10*3/uL (ref 0.1–0.9)
Monocytes: 7 %
Neutrophils Absolute: 5.8 10*3/uL (ref 1.4–7.0)
Neutrophils: 67 %
Platelets: 209 10*3/uL (ref 150–450)
RBC: 4.09 x10E6/uL — ABNORMAL LOW (ref 4.14–5.80)
RDW: 12.7 % (ref 11.6–15.4)
WBC: 8.6 10*3/uL (ref 3.4–10.8)

## 2020-09-19 LAB — LIPID PANEL
Chol/HDL Ratio: 2.6 ratio (ref 0.0–5.0)
Cholesterol, Total: 124 mg/dL (ref 100–199)
HDL: 47 mg/dL (ref >39)
LDL Chol Calc (NIH): 44 mg/dL (ref 0–99)
Triglycerides: 208 mg/dL — ABNORMAL HIGH (ref 0–149)
VLDL Cholesterol Cal: 33 mg/dL (ref 5–40)

## 2020-09-19 LAB — COMPREHENSIVE METABOLIC PANEL
ALT: 18 IU/L (ref 0–44)
AST: 21 IU/L (ref 0–40)
Albumin/Globulin Ratio: 1.5 (ref 1.2–2.2)
Albumin: 4.1 g/dL (ref 3.5–4.6)
Alkaline Phosphatase: 69 IU/L (ref 44–121)
BUN/Creatinine Ratio: 12 (ref 10–24)
BUN: 18 mg/dL (ref 10–36)
Bilirubin Total: 0.4 mg/dL (ref 0.0–1.2)
CO2: 22 mmol/L (ref 20–29)
Calcium: 9.7 mg/dL (ref 8.6–10.2)
Chloride: 100 mmol/L (ref 96–106)
Creatinine, Ser: 1.47 mg/dL — ABNORMAL HIGH (ref 0.76–1.27)
Globulin, Total: 2.7 g/dL (ref 1.5–4.5)
Glucose: 88 mg/dL (ref 65–99)
Potassium: 4.4 mmol/L (ref 3.5–5.2)
Sodium: 142 mmol/L (ref 134–144)
Total Protein: 6.8 g/dL (ref 6.0–8.5)
eGFR: 45 mL/min/{1.73_m2} — ABNORMAL LOW (ref >59)

## 2020-09-19 LAB — TSH: TSH: 1.45 u[IU]/mL (ref 0.450–4.500)

## 2020-09-23 DIAGNOSIS — R001 Bradycardia, unspecified: Secondary | ICD-10-CM | POA: Diagnosis not present

## 2020-09-23 DIAGNOSIS — Z86718 Personal history of other venous thrombosis and embolism: Secondary | ICD-10-CM | POA: Diagnosis not present

## 2020-09-23 DIAGNOSIS — I499 Cardiac arrhythmia, unspecified: Secondary | ICD-10-CM | POA: Diagnosis not present

## 2020-09-23 DIAGNOSIS — I10 Essential (primary) hypertension: Secondary | ICD-10-CM | POA: Diagnosis not present

## 2020-09-23 DIAGNOSIS — I358 Other nonrheumatic aortic valve disorders: Secondary | ICD-10-CM | POA: Diagnosis not present

## 2020-10-14 DIAGNOSIS — I499 Cardiac arrhythmia, unspecified: Secondary | ICD-10-CM | POA: Diagnosis not present

## 2020-10-21 DIAGNOSIS — I358 Other nonrheumatic aortic valve disorders: Secondary | ICD-10-CM | POA: Diagnosis not present

## 2020-10-21 DIAGNOSIS — E785 Hyperlipidemia, unspecified: Secondary | ICD-10-CM | POA: Diagnosis not present

## 2020-10-21 DIAGNOSIS — Z86718 Personal history of other venous thrombosis and embolism: Secondary | ICD-10-CM | POA: Diagnosis not present

## 2020-10-21 DIAGNOSIS — I10 Essential (primary) hypertension: Secondary | ICD-10-CM | POA: Diagnosis not present

## 2020-10-21 DIAGNOSIS — I499 Cardiac arrhythmia, unspecified: Secondary | ICD-10-CM | POA: Diagnosis not present

## 2020-10-31 DIAGNOSIS — I10 Essential (primary) hypertension: Secondary | ICD-10-CM | POA: Diagnosis not present

## 2020-10-31 DIAGNOSIS — R001 Bradycardia, unspecified: Secondary | ICD-10-CM | POA: Insufficient documentation

## 2020-11-06 DIAGNOSIS — I499 Cardiac arrhythmia, unspecified: Secondary | ICD-10-CM | POA: Diagnosis not present

## 2020-11-06 DIAGNOSIS — R001 Bradycardia, unspecified: Secondary | ICD-10-CM | POA: Diagnosis not present

## 2020-11-06 DIAGNOSIS — J9811 Atelectasis: Secondary | ICD-10-CM | POA: Diagnosis not present

## 2020-11-06 DIAGNOSIS — J449 Chronic obstructive pulmonary disease, unspecified: Secondary | ICD-10-CM | POA: Diagnosis not present

## 2020-11-20 DIAGNOSIS — R001 Bradycardia, unspecified: Secondary | ICD-10-CM

## 2020-11-20 DIAGNOSIS — I495 Sick sinus syndrome: Secondary | ICD-10-CM

## 2020-12-09 ENCOUNTER — Other Ambulatory Visit: Payer: Self-pay

## 2020-12-09 ENCOUNTER — Encounter: Payer: Self-pay | Admitting: Cardiology

## 2020-12-09 ENCOUNTER — Observation Stay
Admission: RE | Admit: 2020-12-09 | Discharge: 2020-12-10 | Disposition: A | Discharge status: Home / Self Care | Payer: PPO | Source: Ambulatory Visit | Attending: Cardiology | Admitting: Cardiology

## 2020-12-09 ENCOUNTER — Encounter
Admission: RE | Disposition: A | Discharge status: Home / Self Care | Payer: Self-pay | Source: Ambulatory Visit | Attending: Cardiology

## 2020-12-09 ENCOUNTER — Ambulatory Visit: Payer: Self-pay | Admitting: *Deleted

## 2020-12-09 DIAGNOSIS — Z006 Encounter for examination for normal comparison and control in clinical research program: Secondary | ICD-10-CM | POA: Insufficient documentation

## 2020-12-09 DIAGNOSIS — R059 Cough, unspecified: Secondary | ICD-10-CM

## 2020-12-09 DIAGNOSIS — R001 Bradycardia, unspecified: Secondary | ICD-10-CM | POA: Diagnosis not present

## 2020-12-09 DIAGNOSIS — J029 Acute pharyngitis, unspecified: Secondary | ICD-10-CM | POA: Insufficient documentation

## 2020-12-09 DIAGNOSIS — I495 Sick sinus syndrome: Principal | ICD-10-CM | POA: Insufficient documentation

## 2020-12-09 DIAGNOSIS — Z20822 Contact with and (suspected) exposure to covid-19: Secondary | ICD-10-CM | POA: Diagnosis not present

## 2020-12-09 HISTORY — PX: PACEMAKER LEADLESS INSERTION: EP1219

## 2020-12-09 LAB — SARS CORONAVIRUS 2 BY RT PCR (HOSPITAL ORDER, PERFORMED IN ~~LOC~~ HOSPITAL LAB): SARS Coronavirus 2: NEGATIVE

## 2020-12-09 SURGERY — PACEMAKER LEADLESS INSERTION
Anesthesia: Moderate Sedation

## 2020-12-09 MED ORDER — LIDOCAINE HCL (PF) 1 % IJ SOLN
INTRAMUSCULAR | Status: AC
  Filled 2020-12-09: qty 30

## 2020-12-09 MED ORDER — SODIUM CHLORIDE 0.9% FLUSH: 3.0000 mL | INTRAVENOUS | Status: DC | PRN

## 2020-12-09 MED ORDER — GUAIFENESIN-DM 100-10 MG/5ML PO SYRP
5.0000 mL | ORAL_SOLUTION | ORAL | Status: DC | PRN
  Administered 2020-12-09 – 2020-12-10 (×4): 5 mL via ORAL
  Filled 2020-12-09 (×4): qty 5

## 2020-12-09 MED ORDER — LIDOCAINE HCL (PF) 1 % IJ SOLN
INTRAMUSCULAR | Status: DC | PRN
  Administered 2020-12-09: 30 mL

## 2020-12-09 MED ORDER — HEPARIN SODIUM (PORCINE) 1000 UNIT/ML IJ SOLN
INTRAMUSCULAR | Status: AC
  Filled 2020-12-09: qty 1

## 2020-12-09 MED ORDER — MIDAZOLAM HCL 2 MG/2ML IJ SOLN
INTRAMUSCULAR | Status: AC
  Filled 2020-12-09: qty 2

## 2020-12-09 MED ORDER — ONDANSETRON HCL 4 MG/2ML IJ SOLN: 4.0000 mg | Freq: Four times a day (QID) | INTRAMUSCULAR | Status: DC | PRN

## 2020-12-09 MED ORDER — SIMVASTATIN 40 MG PO TABS
40.0000 mg | ORAL_TABLET | Freq: Every day | ORAL | Status: DC
  Administered 2020-12-09: 40 mg via ORAL
  Filled 2020-12-09 (×2): qty 1

## 2020-12-09 MED ORDER — MIDAZOLAM HCL 2 MG/2ML IJ SOLN
INTRAMUSCULAR | Status: DC | PRN
  Administered 2020-12-09: 0.5 mg via INTRAVENOUS

## 2020-12-09 MED ORDER — HEPARIN SODIUM (PORCINE) 1000 UNIT/ML IJ SOLN
INTRAMUSCULAR | Status: DC | PRN
  Administered 2020-12-09: 3500 [IU] via INTRAVENOUS

## 2020-12-09 MED ORDER — SODIUM CHLORIDE 0.9% FLUSH
3.0000 mL | Freq: Two times a day (BID) | INTRAVENOUS | Status: DC
  Administered 2020-12-09: 3 mL via INTRAVENOUS

## 2020-12-09 MED ORDER — FENTANYL CITRATE (PF) 100 MCG/2ML IJ SOLN
INTRAMUSCULAR | Status: DC | PRN
  Administered 2020-12-09: 25 ug via INTRAVENOUS

## 2020-12-09 MED ORDER — HEPARIN (PORCINE) IN NACL 1000-0.9 UT/500ML-% IV SOLN
INTRAVENOUS | Status: AC
  Filled 2020-12-09: qty 1000

## 2020-12-09 MED ORDER — ACETAMINOPHEN 325 MG PO TABS: 650.0000 mg | ORAL_TABLET | ORAL | Status: DC | PRN

## 2020-12-09 MED ORDER — IOHEXOL 300 MG/ML  SOLN
INTRAMUSCULAR | Status: DC | PRN
  Administered 2020-12-09: 15 mL

## 2020-12-09 MED ORDER — BENZONATATE 100 MG PO CAPS: 100.0000 mg | ORAL_CAPSULE | Freq: Three times a day (TID) | ORAL | 1 refills | Status: DC | PRN

## 2020-12-09 MED ORDER — BENZONATATE 100 MG PO CAPS
100.0000 mg | ORAL_CAPSULE | Freq: Three times a day (TID) | ORAL | Status: DC | PRN
  Administered 2020-12-09 – 2020-12-10 (×2): 100 mg via ORAL
  Filled 2020-12-09 (×2): qty 1

## 2020-12-09 MED ORDER — HEPARIN (PORCINE) IN NACL 2000-0.9 UNIT/L-% IV SOLN
INTRAVENOUS | Status: DC | PRN
  Administered 2020-12-09: 1000 mL

## 2020-12-09 MED ORDER — SODIUM CHLORIDE 0.9 % IV SOLN: 250.0000 mL | INTRAVENOUS | Status: DC | PRN

## 2020-12-09 MED ORDER — FENTANYL CITRATE (PF) 100 MCG/2ML IJ SOLN
INTRAMUSCULAR | Status: AC
  Filled 2020-12-09: qty 2

## 2020-12-09 MED ORDER — SODIUM CHLORIDE 0.9 % IV SOLN: INTRAVENOUS | Status: DC

## 2020-12-09 SURGICAL SUPPLY — 13 items
DILATOR VESSEL 38 20CM 12FR (INTRODUCER) ×2 IMPLANT
DILATOR VESSEL 38 20CM 14FR (INTRODUCER) ×2 IMPLANT
DILATOR VESSEL 38 20CM 18FR (INTRODUCER) ×2 IMPLANT
DILATOR VESSEL 38 20CM 8FR (INTRODUCER) ×2 IMPLANT
MICRA AV TRANSCATH PACING SYS (Pacemaker) ×2 IMPLANT
MICRA INTRODUCER SHEATH (SHEATH) ×2
NEEDLE PERC 18GX7CM (NEEDLE) ×2 IMPLANT
PACK CARDIAC CATH (CUSTOM PROCEDURE TRAY) ×2 IMPLANT
PAD ELECT DEFIB RADIOL ZOLL (MISCELLANEOUS) ×2 IMPLANT
SHEATH AVANTI 7FRX11 (SHEATH) ×2 IMPLANT
SHEATH INTRODUCER MICRA (SHEATH) ×1 IMPLANT
SYSTEM PACING TRNSCTH AV MICRA (Pacemaker) ×1 IMPLANT
WIRE AMPLATZ SS-J .035X180CM (WIRE) ×2 IMPLANT

## 2020-12-09 NOTE — Telephone Encounter (Signed)
Matthew Clayton was advised. Matthew Clayton states patient has already been taking Robitussin. He had also taken covid test that was negative. Sent in rx for [employer]

## 2020-12-09 NOTE — Addendum Note (Signed)
Addended by: Julieta Bellini on: 12/09/2020 04:43 PM   Modules accepted: Orders

## 2020-12-09 NOTE — Telephone Encounter (Signed)
What do you recommend patient take for cough? Please advise. Thanks!

## 2020-12-09 NOTE — Telephone Encounter (Signed)
Pts wife called in stating he is currently in the hospital for a surgery, but he also has a really bad cough and wants to talk to a nurse about what he should take. Please advise.  Called patient to review sx. Patient's wife giving information due to patient now in surgery for pacemaker placement. Sx of dry cough started Sunday night 12/07/20. Patient's wife notified surgical team and patient took at home covid test and results negative. Wife denies patient having fever, runny nose, difficulty breathing. Reports patient has dry coughing spells and can not stop coughing. Patient's wife concerned how patient will feel at pacemaker insertion and coughing while at home recovering. Wife reports she has mentioned to surgical team but no on is addressing coughing concerns. Please advise if medication can be prescribed for coughing. Care advise given. Patient's wife verbalized understanding of care advise and to call back if symptoms worsen.  Patient in recovery at this time.  Reason for Disposition  [1] Continuous (nonstop) coughing interferes with work or school AND [2] no improvement using cough treatment per Care Advice  Answer Assessment - Initial Assessment Questions 1. ONSET: "When did the cough begin?"      Sunday night .  12/07/20 per patient's wife . Patient not with wife now , in surgery for pacemaker placement 2. SEVERITY: "How bad is the cough today?"      Has dry coughing spells and negative at home test for covid  3. SPUTUM: "Describe the color of your sputum" (none, dry cough; clear, white, yellow, green)     Dry cough  4. HEMOPTYSIS: "Are you coughing up any blood?" If so ask: "How much?" (flecks, streaks, tablespoons, etc.)     na 5. DIFFICULTY BREATHING: "Are you having difficulty breathing?" If Yes, ask: "How bad is it?" (e.g., mild, moderate, severe)    - MILD: No SOB at rest, mild SOB with walking, speaks normally in sentences, can lie down, no retractions, pulse < 100.    - MODERATE:  SOB at rest, SOB with minimal exertion and prefers to sit, cannot lie down flat, speaks in phrases, mild retractions, audible wheezing, pulse 100-120.    - SEVERE: Very SOB at rest, speaks in single words, struggling to breathe, sitting hunched forward, retractions, pulse > 120      na 6. FEVER: "Do you have a fever?" If Yes, ask: "What is your temperature, how was it measured, and when did it start?"     denies 7. CARDIAC HISTORY: "Do you have any history of heart disease?" (e.g., heart attack, congestive heart failure)      Pacemaker placed today  8. LUNG HISTORY: "Do you have any history of lung disease?"  (e.g., pulmonary embolus, asthma, emphysema)     na 9. PE RISK FACTORS: "Do you have a history of blood clots?" (or: recent major surgery, recent prolonged travel, bedridden)     na 10. OTHER SYMPTOMS: "Do you have any other symptoms?" (e.g., runny nose, wheezing, chest pain)       denies 11. PREGNANCY: "Is there any chance you are pregnant?" "When was your last menstrual period?"       na 12. TRAVEL: "Have you traveled out of the country in the last month?" (e.g., travel history, exposures)       na  Protocols used: Cough - Acute Non-Productive-A-AH

## 2020-12-10 DIAGNOSIS — R001 Bradycardia, unspecified: Secondary | ICD-10-CM | POA: Diagnosis not present

## 2020-12-10 DIAGNOSIS — I495 Sick sinus syndrome: Secondary | ICD-10-CM | POA: Diagnosis not present

## 2020-12-10 LAB — BASIC METABOLIC PANEL
Anion gap: 8 (ref 5–15)
BUN: 16 mg/dL (ref 8–23)
CO2: 29 mmol/L (ref 22–32)
Calcium: 9.1 mg/dL (ref 8.9–10.3)
Chloride: 102 mmol/L (ref 98–111)
Creatinine, Ser: 1.38 mg/dL — ABNORMAL HIGH (ref 0.61–1.24)
GFR, Estimated: 48 mL/min — ABNORMAL LOW (ref >60)
Glucose, Bld: 104 mg/dL — ABNORMAL HIGH (ref 70–99)
Potassium: 4.2 mmol/L (ref 3.5–5.1)
Sodium: 139 mmol/L (ref 135–145)

## 2020-12-10 MED ORDER — DIPHENHYDRAMINE HCL 25 MG PO CAPS
25.0000 mg | ORAL_CAPSULE | Freq: Once | ORAL | Status: AC
  Administered 2020-12-10: 25 mg via ORAL
  Filled 2020-12-10: qty 1

## 2020-12-10 MED ORDER — AZITHROMYCIN 250 MG PO TABS: 250.0000 mg | ORAL_TABLET | Freq: Once | ORAL | 0 refills | Status: AC

## 2020-12-10 MED ORDER — MENTHOL 3 MG MT LOZG
1.0000 | LOZENGE | OROMUCOSAL | Status: DC | PRN
  Administered 2020-12-10: 3 mg via ORAL
  Filled 2020-12-10: qty 9

## 2020-12-10 NOTE — Discharge Summary (Signed)
    Physician Discharge Summary      Patient ID: Matthew Clayton MRN: [id number] DOB/AGE: 85-Aug-1928 85 y.o.  Admit date: 12/09/2020 Discharge date: 12/10/2020  Primary Discharge Diagnosis sick sinus syndrome Secondary Discharge Diagnosis same  Significant Diagnostic Studies: none  Consults: None  Hospital Course: 87 year old gentleman with a diagnosis of sick sinus syndrome who failed conservative management and opted for surgical management. The patient underwent Micra leadless pacemaker implantation on 12/09/2020 without apparent perioperative complications. The patient denies bleeding or significant tenderness at insertion site. He reports a cough and sore throat that started 2 days prior to coming to the hospital. The cough is mildly productive with clear and white phlegm. He denies chest pain or shortness of breath. He took 2 COVID tests which were negative.   Discharge Exam: Blood pressure 105/60, pulse 64, temperature 98.5 F (36.9 C), resp. rate 17, height '5\' 6"'$  (1.676 m), weight 69.4 kg, SpO2 96 %.   General appearance: alert, cooperative, appears stated age, and no distress Cardio: regular rate and rhythm Extremities: extremities normal, atraumatic, no cyanosis or edema Skin: Skin color, texture, turgor normal. No rashes or lesions Neurologic: Grossly normal Incision/Wound: right femoral area with intacted suture, which was removed, revealing small incision without drainage, edema, bleeding, warmth, or surrounding erythema. Labs:   Lab Results  Component Value Date   WBC 8.6 09/18/2020   HGB 13.1 09/18/2020   HCT 39.4 09/18/2020   MCV 96 09/18/2020   PLT 209 09/18/2020    Recent Labs  Lab 12/10/20 0542  NA 139  K 4.2  CL 102  CO2 29  BUN 16  CREATININE 1.38*  CALCIUM 9.1  GLUCOSE 104*     FOLLOW UP PLANS AND APPOINTMENTS  Allergies as of 12/10/2020   No Known Allergies      Medication List     TAKE these medications    aspirin EC 81 MG  tablet Take 81 mg by mouth daily.   azithromycin 250 MG tablet Commonly known as: Zithromax Take 1 tablet (250 mg total) by mouth once for 1 dose.   benzonatate 100 MG capsule Commonly known as: TESSALON Take 1 capsule (100 mg total) by mouth 3 (three) times daily as needed for cough.   CENTRUM SILVER PO Take 1 tablet by mouth daily at 6 (six) AM.   cetirizine 10 MG tablet Commonly known as: ZYRTEC Take 1 tablet (10 mg total) by mouth daily. What changed:  when to take this reasons to take this   simvastatin 40 MG tablet Commonly known as: ZOCOR TAKE 1 TABLET (40 MG TOTAL) BY MOUTH DAILY. NEEDS APPT FOR FURTHER RX. What changed: additional instructions        Follow-up Information     Fath, Javier Docker, MD. Go in 1 week(s).   Specialty: Cardiology Contact information: [place] [address] [phone]                 BRING ALL MEDICATIONS WITH YOU TO FOLLOW UP APPOINTMENTS  Time spent with patient to include physician time: 30 minutes Signed:  Clabe Seal PA-C 12/10/2020, 9:51 AM

## 2020-12-10 NOTE — Plan of Care (Signed)
  Problem: Health Behavior/Discharge Planning: Goal: Ability to manage health-related needs will improve Outcome: Progressing   Problem: Clinical Measurements: Goal: Ability to maintain clinical measurements within normal limits will improve Outcome: Progressing   Problem: Activity: Goal: Risk for activity intolerance will decrease Outcome: Progressing   Problem: Safety: Goal: Ability to remain free from injury will improve Outcome: Progressing   

## 2020-12-10 NOTE — Progress Notes (Addendum)
Pt is complaining of sore  throat. MD Damita Dunnings made aware. Will continue to monitor.  Update 0455: MD Damita Dunnings ordered (CEPACOL) Lozenge 3 mg oral as needed for sore throat. Will continue to monitor.

## 2020-12-10 NOTE — Discharge Instructions (Signed)
Please avoid submerging yourself in water for the next week. You may take a shower on Thursday, but try to avoid getting the bandage wet. If your bandage gets wet or starts to fall off, replace it with another piece of gauze and the Tegaderm (clear bandage I provided). You should try to keep it covered for a week. You will follow up with Dr. Ubaldo Glassing in the office in about 1 week. If you have bleeding from the site, lie down and apply firm pressure for 20 minutes, if it continues to bleed, call our office [phone]). Avoid heavy lifting, squatting (other than sitting) and strenuous activity for 1 week. You will get a call from [employer] to set up your pacemaker and the [employer] device. You do not need to bring this device with you to appointments. It is used to monitor your pacemaker from home.

## 2020-12-11 ENCOUNTER — Ambulatory Visit: Payer: Self-pay | Admitting: *Deleted

## 2020-12-11 ENCOUNTER — Telehealth: Payer: Self-pay

## 2020-12-11 NOTE — Telephone Encounter (Signed)
Daughter is calling- requesting appointment today. Patient has cold- ears stopped up, sore throat.  Patient had pacemaker placed Tuesday- COVID testing Tuesday- negative.  Reason for Disposition  SEVERE (e.g., excruciating) throat pain  Answer Assessment - Initial Assessment Questions 1. ONSET: "When did the throat start hurting?" (Hours or days ago)      Sunday 2. SEVERITY: "How bad is the sore throat?" (Scale 1-10; mild, moderate or severe)   - MILD (1-3):  doesn't interfere with eating or normal activities   - MODERATE (4-7): interferes with eating some solids and normal activities   - SEVERE (8-10):  excruciating pain, interferes with most normal activities   - SEVERE DYSPHAGIA: can't swallow liquids, drooling     Moderate- not eating 3. STREP EXPOSURE: "Has there been any exposure to strep within the past week?" If Yes, ask: "What type of contact occurred?"      no 4.  VIRAL SYMPTOMS: "Are there any symptoms of a cold, such as a runny nose, cough, hoarse voice or red eyes?"      Ears stopped, nasal congestion, chest congestion 5. FEVER: "Do you have a fever?" If Yes, ask: "What is your temperature, how was it measured, and when did it start?"     Not check 6. PUS ON THE TONSILS: "Is there pus on the tonsils in the back of your throat?"     Not check 7. OTHER SYMPTOMS: "Do you have any other symptoms?" (e.g., difficulty breathing, headache, rash)     no 8. PREGNANCY: "Is there any chance you are pregnant?" "When was your last menstrual period?"     N/a  Protocols used: Sore Throat-A-AH

## 2020-12-11 NOTE — Telephone Encounter (Signed)
Patient's daughterAdministrator, Civil [phone]) is calling office- she is requesting appointment today with PCP. Patient is s/p pacemaker insertion Monday. Patient is having symptoms: sore throat, congestion- chest/sinus, ears clogged/pressure. Daughter advised to recheck COVID test today, check for fever, check for white patches in throat- (call back if testing is + COVID). Patient has been scheduled for appointment tomorrow- but they feel patient needs to be seen today. Advised will send message to office- they are not opposed to going to [facility] if necessary and they can come at any time.

## 2020-12-11 NOTE — Telephone Encounter (Signed)
Please disregard this encounter and see today's previous triage.

## 2020-12-11 NOTE — Telephone Encounter (Signed)
Matthew Clayton daughter, Matthew Clayton is calling back. MatthewStille's Covid test is negative and reporting his temperature is normal. He is not SOB. Again, is requesting the patient be seen today if possible. He is good with seeing the new provider if available. Appointment scheduled for tomorrow.  Please contact patient either way.

## 2020-12-12 ENCOUNTER — Ambulatory Visit (INDEPENDENT_AMBULATORY_CARE_PROVIDER_SITE_OTHER): Payer: PPO | Admitting: Family Medicine

## 2020-12-12 ENCOUNTER — Encounter: Payer: Self-pay | Admitting: Family Medicine

## 2020-12-12 ENCOUNTER — Other Ambulatory Visit: Payer: Self-pay

## 2020-12-12 VITALS — BP 102/64 | HR 53 | Temp 97.7°F | Ht 65.5 in | Wt 156.4 lb

## 2020-12-12 DIAGNOSIS — J029 Acute pharyngitis, unspecified: Secondary | ICD-10-CM

## 2020-12-12 DIAGNOSIS — R059 Cough, unspecified: Secondary | ICD-10-CM | POA: Diagnosis not present

## 2020-12-12 MED ORDER — PREDNISONE 5 MG PO TABS: ORAL_TABLET | ORAL | 0 refills | Status: DC

## 2020-12-12 NOTE — Progress Notes (Signed)
Established patient visit   Patient: Matthew Clayton   DOB: 02-24-1927   87 y.o. Male  MRN: [id number] Visit Date: 12/12/2020  Today's healthcare provider: Vernie Murders, PA-C   No chief complaint on file.  Subjective    HPI  Sore throat began Sunday night so bad could not swallow. Feeling better today,. Some congestion in ear, minor cough. No fever. Taken Z-pack started it Wednesday. Went to ED on 12/09/2020. Not much of an appetite and painful to drink. Been consuming orange juice and water, Tomato soup and potato soup, ice cream are the foods he is consuming currently. Having coughing spasms in his abdomen area. Neck pain he would rate 10/10  Patient Active Problem List   Diagnosis Date Noted   Sick sinus syndrome ([place]) 12/09/2020   Aortic valve sclerosis 07/18/2018   Irregular heart rhythm 04/27/2018   Cellulitis of upper limb 03/09/2017   History of DVT (deep vein thrombosis) 06/07/2016   Fatigue 12/02/2015   Myalgia 12/02/2015   Abnormal chest x-ray 11/25/2015   Anal fissure 11/25/2015   Decrease in the ability to hear 11/25/2015   Clergyman's knee 11/25/2015   Memory loss 11/25/2015   Anemia 08/04/2015   Essential hypertension 08/04/2015   Allergic rhinitis 08/04/2015   Acid reflux 08/04/2015   Hyperlipidemia 07/28/2015   Malaise and fatigue 11/12/2009   LBP (low back pain) 10/28/2009   Inflammation of eyelid 04/01/2009   Dysfunction of eustachian tube 03/05/2008   Pure hypercholesterolemia 07/05/2007   Arthropathy of pelvic region and thigh 11/22/2006   Past Medical History:  Diagnosis Date   DVT (deep venous thrombosis) ([facility])    left leg   Hyperlipidemia    Past Surgical History:  Procedure Laterality Date   APPENDECTOMY     MOLE REMOVAL     PACEMAKER LEADLESS INSERTION N/A 12/09/2020   Procedure: PACEMAKER LEADLESS INSERTION;  Surgeon: Isaias Cowman, MD;  Location: [facility];  Service: Cardiovascular;  Laterality: N/A;    TONSILLECTOMY AND ADENOIDECTOMY     TOTAL HIP ARTHROPLASTY Bilateral    Social History   Tobacco Use   Smoking status: Former    Packs/day: 1.50    Years: 20.00    Pack years: 30.00    Types: Cigarettes    Quit date: 05/16/1960    Years since quitting: 60.6   Smokeless tobacco: Never   Tobacco comments:    quit in 1968  Substance Use Topics   Alcohol use: No    Alcohol/week: 0.0 standard drinks   Drug use: No   No Known Allergies  Medications: Outpatient Medications Prior to Visit  Medication Sig   aspirin EC 81 MG tablet Take 81 mg by mouth daily.   benzonatate (TESSALON) 100 MG capsule Take 1 capsule (100 mg total) by mouth 3 (three) times daily as needed for cough.   cetirizine (ZYRTEC) 10 MG tablet Take 1 tablet (10 mg total) by mouth daily.   Multiple Vitamins-Minerals (CENTRUM SILVER PO) Take 1 tablet by mouth daily at 6 (six) AM.   simvastatin (ZOCOR) 40 MG tablet TAKE 1 TABLET (40 MG TOTAL) BY MOUTH DAILY. NEEDS APPT FOR FURTHER RX.   No facility-administered medications prior to visit.    Review of Systems  Constitutional: Negative.  Negative for fever.  HENT:  Positive for sore throat.   Eyes: Negative.   Respiratory:  Positive for cough.   Cardiovascular: Negative.   Gastrointestinal: Negative.        Objective  There were no vitals taken for this visit. BP Readings from Last 3 Encounters:  12/10/20 105/60  09/18/20 (!) 102/55  03/18/20 104/68   Wt Readings from Last 3 Encounters:  12/09/20 153 lb (69.4 kg)  09/18/20 163 lb (73.9 kg)  03/18/20 166 lb (75.3 kg)    Physical Exam Constitutional:      General: He is not in acute distress.    Appearance: He is well-developed.  HENT:     Head: Normocephalic and atraumatic.     Right Ear: Hearing and tympanic membrane normal.     Left Ear: Hearing and tympanic membrane normal.     Nose: Nose normal.     Mouth/Throat:     Pharynx: Uvula midline. Posterior oropharyngeal erythema present. No  pharyngeal swelling or oropharyngeal exudate.     Tonsils: No tonsillar exudate or tonsillar abscesses.  Eyes:     General: Lids are normal. No scleral icterus.       Right eye: No discharge.        Left eye: No discharge.     Conjunctiva/sclera: Conjunctivae normal.  Cardiovascular:     Rate and Rhythm: Normal rate and regular rhythm.     Heart sounds: Normal heart sounds.  Pulmonary:     Effort: Pulmonary effort is normal. No respiratory distress.  Musculoskeletal:        General: Normal range of motion.  Skin:    Findings: No lesion or rash.  Neurological:     Mental Status: He is alert and oriented to person, place, and time.  Psychiatric:        Speech: Speech normal.        Behavior: Behavior normal.        Thought Content: Thought content normal.      No results found for any visits on 12/12/20.  Assessment & Plan     1. Sore throat Developed sore throat on the evening of 12-07-20. Has not had any fever but feels the sore throat is worse, despite being on a Z-pak the past 2-3 days. Not much help from saltwater gargles or Chloroseptic spray. Will add prednisone taper and be sure to drink plenty of fluids. - predniSONE (DELTASONE) 5 MG tablet; Taper down by 1 tablet daily by mouth starting at 6 tablets day 1, 5 day 2, 4 day 3, 3 day 4, 2 day 5 and 1 day 6. Divide dosage among meals and bedtime each day.  Dispense: 21 tablet; Refill: 0  2. Cough Ticklish cough with rare sputum production. No wheezing, rales or rhonchi on exam. Will give prednisone taper and may add Robitussin to the [employer]. Finish the Z-pak and drink plenty of fluids. Home to rest and continue COVID precautions. - predniSONE (DELTASONE) 5 MG tablet; Taper down by 1 tablet daily by mouth starting at 6 tablets day 1, 5 day 2, 4 day 3, 3 day 4, 2 day 5 and 1 day 6. Divide dosage among meals and bedtime each day.  Dispense: 21 tablet; Refill: 0   No follow-ups on file.      I, Renetta Suman, PA-C,  have reviewed all documentation for this visit. The documentation on 12/12/20 for the exam, diagnosis, procedures, and orders are all accurate and complete.    Vernie Murders, PA-C  [employer] [phone] (phone) [phone] (fax)  [place]

## 2020-12-12 NOTE — Telephone Encounter (Signed)
Patient was seen in office today.  

## 2020-12-16 NOTE — Telephone Encounter (Signed)
Patient has already been seen. 

## 2020-12-18 DIAGNOSIS — I495 Sick sinus syndrome: Secondary | ICD-10-CM | POA: Diagnosis not present

## 2020-12-18 DIAGNOSIS — I358 Other nonrheumatic aortic valve disorders: Secondary | ICD-10-CM | POA: Diagnosis not present

## 2020-12-18 DIAGNOSIS — R051 Acute cough: Secondary | ICD-10-CM | POA: Diagnosis not present

## 2020-12-18 DIAGNOSIS — R490 Dysphonia: Secondary | ICD-10-CM | POA: Diagnosis not present

## 2020-12-18 DIAGNOSIS — I499 Cardiac arrhythmia, unspecified: Secondary | ICD-10-CM | POA: Diagnosis not present

## 2020-12-18 DIAGNOSIS — R001 Bradycardia, unspecified: Secondary | ICD-10-CM | POA: Diagnosis not present

## 2020-12-18 DIAGNOSIS — E785 Hyperlipidemia, unspecified: Secondary | ICD-10-CM | POA: Diagnosis not present

## 2020-12-18 DIAGNOSIS — I10 Essential (primary) hypertension: Secondary | ICD-10-CM | POA: Diagnosis not present

## 2020-12-18 DIAGNOSIS — J9811 Atelectasis: Secondary | ICD-10-CM | POA: Diagnosis not present

## 2020-12-18 DIAGNOSIS — R0602 Shortness of breath: Secondary | ICD-10-CM | POA: Diagnosis not present

## 2020-12-22 ENCOUNTER — Telehealth: Payer: Self-pay | Admitting: Family Medicine

## 2020-12-22 DIAGNOSIS — Z1159 Encounter for screening for other viral diseases: Secondary | ICD-10-CM | POA: Diagnosis not present

## 2020-12-22 DIAGNOSIS — R051 Acute cough: Secondary | ICD-10-CM | POA: Diagnosis not present

## 2020-12-22 NOTE — Telephone Encounter (Signed)
Pt is in office. Awaiting approval before let the pt go. Lovey Newcomer can be paged. [phone]

## 2020-12-22 NOTE — Telephone Encounter (Signed)
Sandy with Dr. Buzzy Han is calling to ask clearance to start the pt on 3 medications. Valtrex Cipro Gabapentin Cb- [phone]

## 2020-12-22 NOTE — Telephone Encounter (Signed)
Matthew Clayton with Dr. Jeannie Fend 's office called asking about clearance for the medications.  CB#  [phone]

## 2020-12-23 NOTE — Telephone Encounter (Signed)
Please advise. Thanks.  

## 2020-12-23 NOTE — Telephone Encounter (Signed)
Sandy cb concerning message below. Please advise if it is okay for pt to began medications listed?

## 2020-12-24 ENCOUNTER — Ambulatory Visit: Payer: Self-pay | Admitting: *Deleted

## 2020-12-24 NOTE — Telephone Encounter (Signed)
Patient's wife DPR called to get clearance for patient to start taking medications prescribed by Dr. Jeannie Fend ENT, valtrex, cipro, gabapentin. Reviewed message from 12/24/20 from Dr. Rosanna Randy  on 12/24/20 with patient's wife, yes clearance to start medications mentioned above. Patient's wife reports she will start giving patient medications today. [facility], at Dr. Shelbie Ammons office to notify of clearance for medications and patient will start taking valtrex, cipro and gabapentin today, no answer, left message to call [facility] if needed for confirmation.  Patient's wife verbalized understanding of care advise and to call back if needed.

## 2020-12-24 NOTE — Telephone Encounter (Signed)
LMOVM for pt to return call. Okay for [facility] triage to advise patient he can start medications mentioned below.

## 2020-12-24 NOTE — Telephone Encounter (Signed)
Noted  

## 2020-12-24 NOTE — Telephone Encounter (Signed)
Matthew Clayton has been advised of this.

## 2020-12-24 NOTE — Telephone Encounter (Signed)
[place], from Dr. Shelbie Ammons office called back to confirm patient notified clearance was given to start medications , valtrex, cipro, gabapentin per PCP. Reviewed this NT reviewed with patient's wife PCP cleared to take medications listed above. Sandy verbalized understanding and will f/u with patient.

## 2020-12-26 DIAGNOSIS — R051 Acute cough: Secondary | ICD-10-CM | POA: Diagnosis not present

## 2020-12-26 DIAGNOSIS — Z1159 Encounter for screening for other viral diseases: Secondary | ICD-10-CM | POA: Diagnosis not present

## 2021-01-01 DIAGNOSIS — R051 Acute cough: Secondary | ICD-10-CM | POA: Diagnosis not present

## 2021-01-01 DIAGNOSIS — Z1159 Encounter for screening for other viral diseases: Secondary | ICD-10-CM | POA: Diagnosis not present

## 2021-01-09 ENCOUNTER — Telehealth (INDEPENDENT_AMBULATORY_CARE_PROVIDER_SITE_OTHER): Payer: Self-pay | Admitting: Vascular Surgery

## 2021-01-09 NOTE — Telephone Encounter (Signed)
Called wife to schedule patient. Patient also has questions about medications that she will discuss on visit Monday 01/12/21

## 2021-01-09 NOTE — Telephone Encounter (Signed)
Patient spouse left vm stating patient left leg and knee is swollen and painful.  This was the same legs patient had a blood clot in 2 years ago.  Patient was last seen by Dr. Delana Meyer on 02/25/2020 DVT.  Please advise.

## 2021-01-09 NOTE — Telephone Encounter (Signed)
Patient can be schedule for left leg dvt ultrasound see [facility]chn[facility] or Arna Medici. However if patient is negative he should follow up with his PCP per Eulogio Ditch NP

## 2021-01-10 ENCOUNTER — Emergency Department: Payer: PPO

## 2021-01-10 ENCOUNTER — Other Ambulatory Visit: Payer: Self-pay

## 2021-01-10 ENCOUNTER — Inpatient Hospital Stay
Admission: EM | Admit: 2021-01-10 | Discharge: 2021-01-13 | DRG: 164 | Disposition: A | Discharge status: Home Health | Payer: PPO | Attending: Internal Medicine | Admitting: Internal Medicine

## 2021-01-10 DIAGNOSIS — E782 Mixed hyperlipidemia: Secondary | ICD-10-CM | POA: Diagnosis present

## 2021-01-10 DIAGNOSIS — Z96643 Presence of artificial hip joint, bilateral: Secondary | ICD-10-CM | POA: Diagnosis present

## 2021-01-10 DIAGNOSIS — J90 Pleural effusion, not elsewhere classified: Secondary | ICD-10-CM | POA: Diagnosis not present

## 2021-01-10 DIAGNOSIS — Z87891 Personal history of nicotine dependence: Secondary | ICD-10-CM

## 2021-01-10 DIAGNOSIS — Z86718 Personal history of other venous thrombosis and embolism: Secondary | ICD-10-CM

## 2021-01-10 DIAGNOSIS — I82441 Acute embolism and thrombosis of right tibial vein: Secondary | ICD-10-CM | POA: Diagnosis not present

## 2021-01-10 DIAGNOSIS — Z8249 Family history of ischemic heart disease and other diseases of the circulatory system: Secondary | ICD-10-CM | POA: Diagnosis not present

## 2021-01-10 DIAGNOSIS — M25562 Pain in left knee: Secondary | ICD-10-CM | POA: Diagnosis not present

## 2021-01-10 DIAGNOSIS — Z20822 Contact with and (suspected) exposure to covid-19: Secondary | ICD-10-CM | POA: Diagnosis present

## 2021-01-10 DIAGNOSIS — I2699 Other pulmonary embolism without acute cor pulmonale: Principal | ICD-10-CM | POA: Diagnosis present

## 2021-01-10 DIAGNOSIS — I495 Sick sinus syndrome: Secondary | ICD-10-CM | POA: Diagnosis present

## 2021-01-10 DIAGNOSIS — Z803 Family history of malignant neoplasm of breast: Secondary | ICD-10-CM | POA: Diagnosis not present

## 2021-01-10 DIAGNOSIS — K219 Gastro-esophageal reflux disease without esophagitis: Secondary | ICD-10-CM | POA: Diagnosis present

## 2021-01-10 DIAGNOSIS — E785 Hyperlipidemia, unspecified: Secondary | ICD-10-CM | POA: Diagnosis present

## 2021-01-10 DIAGNOSIS — I82403 Acute embolism and thrombosis of unspecified deep veins of lower extremity, bilateral: Secondary | ICD-10-CM | POA: Diagnosis not present

## 2021-01-10 DIAGNOSIS — Z79899 Other long term (current) drug therapy: Secondary | ICD-10-CM | POA: Diagnosis not present

## 2021-01-10 DIAGNOSIS — G629 Polyneuropathy, unspecified: Secondary | ICD-10-CM | POA: Diagnosis present

## 2021-01-10 DIAGNOSIS — Z7952 Long term (current) use of systemic steroids: Secondary | ICD-10-CM

## 2021-01-10 DIAGNOSIS — I82493 Acute embolism and thrombosis of other specified deep vein of lower extremity, bilateral: Secondary | ICD-10-CM

## 2021-01-10 DIAGNOSIS — Z95810 Presence of automatic (implantable) cardiac defibrillator: Secondary | ICD-10-CM | POA: Diagnosis not present

## 2021-01-10 DIAGNOSIS — I10 Essential (primary) hypertension: Secondary | ICD-10-CM | POA: Diagnosis present

## 2021-01-10 DIAGNOSIS — R079 Chest pain, unspecified: Secondary | ICD-10-CM | POA: Diagnosis not present

## 2021-01-10 DIAGNOSIS — R0902 Hypoxemia: Secondary | ICD-10-CM | POA: Diagnosis not present

## 2021-01-10 DIAGNOSIS — I82432 Acute embolism and thrombosis of left popliteal vein: Secondary | ICD-10-CM | POA: Diagnosis present

## 2021-01-10 DIAGNOSIS — Z823 Family history of stroke: Secondary | ICD-10-CM | POA: Diagnosis not present

## 2021-01-10 DIAGNOSIS — I82411 Acute embolism and thrombosis of right femoral vein: Secondary | ICD-10-CM | POA: Diagnosis present

## 2021-01-10 DIAGNOSIS — I82409 Acute embolism and thrombosis of unspecified deep veins of unspecified lower extremity: Secondary | ICD-10-CM | POA: Insufficient documentation

## 2021-01-10 DIAGNOSIS — Z7982 Long term (current) use of aspirin: Secondary | ICD-10-CM | POA: Diagnosis not present

## 2021-01-10 DIAGNOSIS — M7989 Other specified soft tissue disorders: Secondary | ICD-10-CM

## 2021-01-10 DIAGNOSIS — I358 Other nonrheumatic aortic valve disorders: Secondary | ICD-10-CM | POA: Diagnosis present

## 2021-01-10 DIAGNOSIS — R9431 Abnormal electrocardiogram [ECG] [EKG]: Secondary | ICD-10-CM | POA: Diagnosis not present

## 2021-01-10 DIAGNOSIS — I82433 Acute embolism and thrombosis of popliteal vein, bilateral: Secondary | ICD-10-CM | POA: Diagnosis not present

## 2021-01-10 DIAGNOSIS — R600 Localized edema: Secondary | ICD-10-CM | POA: Diagnosis not present

## 2021-01-10 DIAGNOSIS — I2693 Single subsegmental pulmonary embolism without acute cor pulmonale: Secondary | ICD-10-CM | POA: Diagnosis not present

## 2021-01-10 LAB — CBC WITH DIFFERENTIAL/PLATELET
Abs Immature Granulocytes: 0.04 10*3/uL (ref 0.00–0.07)
Basophils Absolute: 0 10*3/uL (ref 0.0–0.1)
Basophils Relative: 0 %
Eosinophils Absolute: 0.1 10*3/uL (ref 0.0–0.5)
Eosinophils Relative: 2 %
HCT: 38.6 % — ABNORMAL LOW (ref 39.0–52.0)
Hemoglobin: 13.3 g/dL (ref 13.0–17.0)
Immature Granulocytes: 0 %
Lymphocytes Relative: 12 %
Lymphs Abs: 1.1 10*3/uL (ref 0.7–4.0)
MCH: 34 pg (ref 26.0–34.0)
MCHC: 34.5 g/dL (ref 30.0–36.0)
MCV: 98.7 fL (ref 80.0–100.0)
Monocytes Absolute: 0.9 10*3/uL (ref 0.1–1.0)
Monocytes Relative: 10 %
Neutro Abs: 6.8 10*3/uL (ref 1.7–7.7)
Neutrophils Relative %: 76 %
Platelets: 244 10*3/uL (ref 150–400)
RBC: 3.91 MIL/uL — ABNORMAL LOW (ref 4.22–5.81)
RDW: 14.1 % (ref 11.5–15.5)
WBC: 9 10*3/uL (ref 4.0–10.5)
nRBC: 0 % (ref 0.0–0.2)

## 2021-01-10 LAB — URINALYSIS, ROUTINE W REFLEX MICROSCOPIC
Bilirubin Urine: NEGATIVE
Glucose, UA: NEGATIVE mg/dL
Hgb urine dipstick: NEGATIVE
Ketones, ur: NEGATIVE mg/dL
Leukocytes,Ua: NEGATIVE
Nitrite: NEGATIVE
Protein, ur: NEGATIVE mg/dL
Specific Gravity, Urine: 1.014 (ref 1.005–1.030)
pH: 7 (ref 5.0–8.0)

## 2021-01-10 LAB — COMPREHENSIVE METABOLIC PANEL
ALT: 14 U/L (ref 0–44)
AST: 19 U/L (ref 15–41)
Albumin: 3.3 g/dL — ABNORMAL LOW (ref 3.5–5.0)
Alkaline Phosphatase: 66 U/L (ref 38–126)
Anion gap: 9 (ref 5–15)
BUN: 13 mg/dL (ref 8–23)
CO2: 26 mmol/L (ref 22–32)
Calcium: 8.9 mg/dL (ref 8.9–10.3)
Chloride: 103 mmol/L (ref 98–111)
Creatinine, Ser: 1.11 mg/dL (ref 0.61–1.24)
GFR, Estimated: >60 mL/min (ref >60)
Glucose, Bld: 105 mg/dL — ABNORMAL HIGH (ref 70–99)
Potassium: 4.1 mmol/L (ref 3.5–5.1)
Sodium: 138 mmol/L (ref 135–145)
Total Bilirubin: 1 mg/dL (ref 0.3–1.2)
Total Protein: 6.9 g/dL (ref 6.5–8.1)

## 2021-01-10 LAB — BRAIN NATRIURETIC PEPTIDE: B Natriuretic Peptide: 185.2 pg/mL — ABNORMAL HIGH (ref 0.0–100.0)

## 2021-01-10 LAB — RESP PANEL BY RT-PCR (FLU A&B, COVID) ARPGX2
Influenza A by PCR: NEGATIVE
Influenza B by PCR: NEGATIVE
SARS Coronavirus 2 by RT PCR: NEGATIVE

## 2021-01-10 LAB — APTT: aPTT: 33 seconds (ref 24–36)

## 2021-01-10 LAB — PROTIME-INR
INR: 1.1 (ref 0.8–1.2)
Prothrombin Time: 14.3 seconds (ref 11.4–15.2)

## 2021-01-10 LAB — HEPARIN LEVEL (UNFRACTIONATED): Heparin Unfractionated: 0.21 IU/mL — ABNORMAL LOW (ref 0.30–0.70)

## 2021-01-10 LAB — TROPONIN I (HIGH SENSITIVITY): Troponin I (High Sensitivity): 11 ng/L (ref <18)

## 2021-01-10 MED ORDER — GABAPENTIN 300 MG PO CAPS
300.0000 mg | ORAL_CAPSULE | Freq: Every day | ORAL | Status: DC
  Administered 2021-01-10: 300 mg via ORAL
  Filled 2021-01-10: qty 1

## 2021-01-10 MED ORDER — GABAPENTIN 100 MG PO CAPS: 100.0000 mg | ORAL_CAPSULE | ORAL | Status: DC

## 2021-01-10 MED ORDER — ONDANSETRON HCL 4 MG/2ML IJ SOLN: 4.0000 mg | Freq: Four times a day (QID) | INTRAMUSCULAR | Status: DC | PRN

## 2021-01-10 MED ORDER — SIMVASTATIN 20 MG PO TABS
40.0000 mg | ORAL_TABLET | Freq: Every day | ORAL | Status: DC
  Administered 2021-01-10 – 2021-01-12 (×3): 40 mg via ORAL
  Filled 2021-01-10 (×3): qty 2

## 2021-01-10 MED ORDER — ACETAMINOPHEN 325 MG PO TABS: 650.0000 mg | ORAL_TABLET | Freq: Four times a day (QID) | ORAL | Status: AC | PRN

## 2021-01-10 MED ORDER — IOHEXOL 350 MG/ML SOLN
75.0000 mL | Freq: Once | INTRAVENOUS | Status: AC | PRN
  Administered 2021-01-10: 75 mL via INTRAVENOUS

## 2021-01-10 MED ORDER — ONDANSETRON HCL 4 MG PO TABS
4.0000 mg | ORAL_TABLET | Freq: Four times a day (QID) | ORAL | Status: DC | PRN
Start: 2021-01-10 — End: 2021-01-13

## 2021-01-10 MED ORDER — ACETAMINOPHEN 650 MG RE SUPP: 650.0000 mg | Freq: Four times a day (QID) | RECTAL | Status: AC | PRN

## 2021-01-10 MED ORDER — GABAPENTIN 400 MG PO CAPS
400.0000 mg | ORAL_CAPSULE | Freq: Every morning | ORAL | Status: DC
  Administered 2021-01-11: 400 mg via ORAL
  Filled 2021-01-10: qty 4

## 2021-01-10 MED ORDER — DILTIAZEM HCL 25 MG/5ML IV SOLN
5.0000 mg | INTRAVENOUS | Status: DC | PRN
Start: 2021-01-10 — End: 2021-01-11

## 2021-01-10 MED ORDER — HEPARIN BOLUS VIA INFUSION
5000.0000 [IU] | Freq: Once | INTRAVENOUS | Status: AC
  Administered 2021-01-10: 5000 [IU] via INTRAVENOUS
  Filled 2021-01-10: qty 5000

## 2021-01-10 MED ORDER — HEPARIN (PORCINE) 25000 UT/250ML-% IV SOLN
1550.0000 [IU]/h | INTRAVENOUS | Status: DC
  Administered 2021-01-10: 1100 [IU]/h via INTRAVENOUS
  Administered 2021-01-11: 1250 [IU]/h via INTRAVENOUS
  Administered 2021-01-12 (×2): 1400 [IU]/h via INTRAVENOUS
  Administered 2021-01-13: 1550 [IU]/h via INTRAVENOUS
  Filled 2021-01-10 (×4): qty 250

## 2021-01-10 NOTE — H&P (Addendum)
History and Physical   Matthew Clayton [id number] DOB: 09-17-1926 DOA: 01/10/2021  PCP: Jerrol Banana., MD  Outpatient Specialists: Dr. Ubaldo Glassing [facility] Patient coming from: home   I have personally briefly reviewed patient's old medical records in [place].  Chief Concern: lower extremity swelling   HPI: Matthew Clayton is a 87 y.o. male with medical history significant for hypertension, hyperlipidemia, history of left lower extremity DVT after an international flight in 2017 from [place], completed 6 months of anticoagulation with Xarelto, presents to the emergency department for chief concerns of worsening left lower extremity swelling that started on 01/09/2021.  At bedside he is able to tell me his full name, his age, current location of hospital, and the current calendar year is 2022.  Matthew Clayton reports that he had a pacemaker placement and stopped taking aspirin on 12/08/20 for cardiac placement procedure on 12/09/20. The aspirin has not been resumed at this time.  He reports his left lower extremity swelling started on Thursday, 01/08/21.  He denies trauma to the leg, diet changes, medication changes other than the aspirin.  He endorses tender and sharp right lower anterior lateral chest pain that he described as tenderness. He reports the pain is worse with inhalation. He reports the chest discomfort started today during the CT imaging.  He denies headaches, syncope, loss of consciousness, vision changes, dysphagia, nausea, vomiting, fever, shortness of breath, abdominal pain, dysuria, hematuria, diarrhea.  Social history: He lives at home with his wife. Patient does not use tobacco products, EtOH, recreational drug use. 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  Vaccination history: He is vaccinated for COVID-19, 3 doses total from [employer].  ROS: Constitutional: no weight change, no fever ENT/Mouth: no sore throat, no rhinorrhea Eyes:  no eye pain, no vision changes Cardiovascular: + chest pain, no dyspnea,  no edema, no palpitations Respiratory: no cough, no sputum, no wheezing Gastrointestinal: no nausea, no vomiting, no diarrhea, no constipation Genitourinary: no urinary incontinence, no dysuria, no hematuria Musculoskeletal: no arthralgias, no myalgias Skin: no skin lesions, no pruritus, Neuro: + weakness, no loss of consciousness, no syncope Psych: no anxiety, no depression, + decrease appetite Heme/Lymph: no bruising, no bleeding  ED Course: Discussed with emergency medicine provider, patient requiring hospitalization for chief concerns pulmonary embolism.    Vitals in the emergency department was remarkable for temperature of 97.9, respiration rate of 20, heart rate 80, blood pressure 111/51, SPO2 of 100% on room air, sodium 138, potassium 4.1, chloride 103, bicarb 26, BUN 13, serum creatinine of 1.11, nonfasting blood glucose 105, WBC 9.0, hemoglobin 13.3, platelets 244.  BNP 185.  Troponin was 11.  Emergency medicine provider initiated heparin GTT.  Vascular surgeon, Dr. Lowella Grip, has been consulted for evaluation of pulmonary embolism and bilateral lower extremity DVT.  Assessment/Plan  Principal Problem:   Pulmonary embolism (HCC) Active Problems:   Hyperlipidemia   Essential hypertension   Acid reflux   History of DVT (deep vein thrombosis)   Aortic valve sclerosis   Sick sinus syndrome (HCC)  # Acute on chronic occlusive and nonocclusive lobar and segmental pulmonary embolism in the right middle lobe and bilateral lower lobe. - Continue heparin GTT per pharmacy for pulmonary embolism - Complete echo ordered - Vascular surgeon, Dr. Lowella Grip has been consulted, appreciate further recommendations and evaluation - Keep patient n.p.o. except for ice chips and sips with meds at this time pending vascular evaluation - Discussed extensively the benefits and  risk of heparin including bleeding with patient and  spouse at bedside - Admit to progressive cardiac, observation, telemetry  # Hyperlipidemia-simvastatin 40 mg nightly resumed  # History of left lower extremity DVT secondary to international flight to [place] in [place] in 2017  # Sick sinus syndrome status post pacemaker placement in July 2022  # Neuropathy-spouse at bedside has a regimen for patient's - Gabapentin 400 mg every morning, 300 mg in the afternoon, 300 mg at bedtime -This has been ordered per instructions by spouse at bedside  # GERD-currently not taking PPI  Chart reviewed.   11/04/2020-patient was seen by [facility], Dr. Delana Meyer, who noted that IVC filter is not indicated at that time.  05/19/2016: He was seen by his primary care doctor and was advised to continue Xarelto 15 mg twice daily to complete 3 weeks and then transition to Xarelto 20 mg daily on day 22 to complete 6 months of anticoagulation  05/17/2016: Patient was diagnosed with acute left lower extremity DVT in the emergency department and was discharged from the ED with prescription for Xarelto 15 mg p.o. twice daily for 21 days and switch to Xarelto 20 mg once daily on day 22.  Patient had a close of follow-up appointment with his PCP in 1 to 2 days after ED discharge  04/21/2016 patient returned from an international flight from [place]  DVT prophylaxis: Heparin GTT Code Status: Full code Diet: N.p.o., except for sips with meds and ice chips Family Communication: Updated spouse, Ms. Conal Treharne at bedside Disposition Plan: Pending clinical course Consults called: Vascular, Dr. Lowella Grip Admission status: Progressive cardiac, observation, telemetry  Past Medical History:  Diagnosis Date   DVT (deep venous thrombosis) ([place])    left leg   Hyperlipidemia    Past Surgical History:  Procedure Laterality Date   APPENDECTOMY     MOLE REMOVAL     PACEMAKER LEADLESS INSERTION N/A 12/09/2020   Procedure: PACEMAKER LEADLESS INSERTION;  Surgeon:  Isaias Cowman, MD;  Location: [facility];  Service: Cardiovascular;  Laterality: N/A;   TONSILLECTOMY AND ADENOIDECTOMY     TOTAL HIP ARTHROPLASTY Bilateral    Social History:  reports that he quit smoking about 60 years ago. His smoking use included cigarettes. He has a 30.00 pack-year smoking history. He has never used smokeless tobacco. He reports that he does not drink alcohol and does not use drugs.  No Known Allergies Family History  Problem Relation Age of Onset   Breast cancer Mother    Cancer Mother        eye cancer   Heart disease Father    Breast cancer Sister    Stroke Brother    Heart attack Brother    Breast cancer Sister    Aneurysm Sister    Family history: Family history reviewed and not pertinent  Prior to Admission medications   Medication Sig Start Date End Date Taking? Authorizing Provider  aspirin EC 81 MG tablet Take 81 mg by mouth daily.    [provider]  benzonatate (TESSALON) 100 MG capsule Take 1 capsule (100 mg total) by mouth 3 (three) times daily as needed for cough. 12/09/20   Jerrol Banana., MD  cetirizine (ZYRTEC) 10 MG tablet Take 1 tablet (10 mg total) by mouth daily. 06/16/16   Jerrol Banana., MD  Multiple Vitamins-Minerals (CENTRUM SILVER PO) Take 1 tablet by mouth daily at 6 (six) AM.    [provider]  predniSONE (DELTASONE) 5 MG tablet Taper down by 1 tablet daily by mouth starting at 6 tablets day 1, 5 day 2, 4 day 3, 3 day 4, 2 day 5 and 1 day 6. Divide dosage among meals and bedtime each day. 12/12/20   Chrismon, Vickki Muff, PA-C  simvastatin (ZOCOR) 40 MG tablet TAKE 1 TABLET (40 MG TOTAL) BY MOUTH DAILY. NEEDS APPT FOR FURTHER RX. 11/28/19   Jerrol Banana., MD   Physical Exam: Vitals:   01/10/21 1142 01/10/21 1230 01/10/21 1344 01/10/21 1345  BP: 123/70 118/64 (!) 132/117 (!) 132/117  Pulse: 67 (!) 57 82 79  Resp:  (!) 9  (!) 27  Temp:      TempSrc:      SpO2: 100% 99% 96% 100%   Weight:      Height:       Constitutional: appears age appropriate, NAD, calm, comfortable Eyes: PERRL, lids and conjunctivae normal ENMT: Mucous membranes are moist. Posterior pharynx clear of any exudate or lesions. Age-appropriate dentition. Mild hearing loss. Neck: normal, supple, no masses, no thyromegaly Respiratory: clear to auscultation bilaterally, no wheezing, no crackles. Normal respiratory effort. No accessory muscle use.  Cardiovascular: Regular rate and rhythm, no murmurs / rubs / gallops. No extremity edema. 2+ pedal pulses. No carotid bruits. Left lower extremity swelling with negative Homan's sign. Abdomen: no tenderness, no masses palpated, no hepatosplenomegaly. Bowel sounds positive.  Musculoskeletal: no clubbing / cyanosis. No joint deformity upper and lower extremities. Good ROM, no contractures, no atrophy. Normal muscle tone.  Skin: no rashes, lesions, ulcers. No induration Neurologic: Sensation intact. Strength 5/5 in all 4.  Psychiatric: Normal judgment and insight. Alert and oriented x 3. Normal mood.   EKG: independently reviewed, showing sinus rhythm with rate of 70, QTc 435  Chest x-ray on Admission: I personally reviewed and I agree with radiologist reading as below.  CT Angio Chest PE W and/or Wo Contrast  Result Date: 01/10/2021 CLINICAL DATA:  Lower extremity DVT. EXAM: CT ANGIOGRAPHY CHEST WITH CONTRAST TECHNIQUE: Multidetector CT imaging of the chest was performed using the standard protocol during bolus administration of intravenous contrast. Multiplanar CT image reconstructions and MIPs were obtained to evaluate the vascular anatomy. CONTRAST:  53m OMNIPAQUE IOHEXOL 350 MG/ML SOLN COMPARISON:  CTA chest dated May 17, 2016. FINDINGS: Cardiovascular: Satisfactory opacification of the pulmonary arteries to the segmental level. Acute occlusive pulmonary emboli involving the right middle lobe and left lower lobe lateral basal segment. More wall adherent  near occlusive pulmonary emboli in the bilateral interlobar and lower lobe segmental pulmonary arteries. Mild right heart enlargement with elevated RV/LV ratio of 1.3. No pericardial effusion. Reflux of contrast into the IVC. Mediastinum/Nodes: No enlarged mediastinal, hilar, or axillary lymph nodes. Thyroid gland, trachea, and esophagus demonstrate no significant findings. Lungs/Pleura: Peripheral ground-glass density in the right middle lobe (series 6, image 51). Trace right pleural effusion. No pneumothorax. Mild subpleural cystic change and increased reticulation in both lungs, slightly progressed since 2018. Upper Abdomen: No acute abnormality. Musculoskeletal: No chest wall abnormality. No acute or significant osseous findings. Review of the MIP images confirms the above findings. IMPRESSION: 1. Acute on chronic occlusive and nonocclusive lobar and segmental pulmonary emboli involving the right middle lobe and both lower lobes. Positive for acute PE with CT evidence of right heart strain (RV/LV Ratio = 1.3) consistent with at least submassive (intermediate risk) PE. The presence of right heart strain has been associated with an increased risk of morbidity and mortality.  2. Peripheral ground-glass density in the right middle lobe, concerning for pulmonary infarct. 3. Trace right pleural effusion. 4. Mild chronic interstitial lung disease, slightly progressed since 2018. Critical Value/emergent results were called by telephone at the time of interpretation on 01/10/2021 at 2:02 pm to provider [facility] , who verbally acknowledged these results. Electronically Signed   By: Titus Dubin M.D.   On: 01/10/2021 14:08   US Venous Img Lower Bilateral (DVT)  Result Date: 01/10/2021 CLINICAL DATA:  87 year old male with lower extremity edema EXAM: BILATERAL LOWER EXTREMITY VENOUS DOPPLER ULTRASOUND TECHNIQUE: Gray-scale sonography with graded compression, as well as color Doppler and duplex ultrasound were  performed to evaluate the lower extremity deep venous systems from the level of the common femoral vein and including the common femoral, femoral, profunda femoral, popliteal and calf veins including the posterior tibial, peroneal and gastrocnemius veins when visible. The superficial great saphenous vein was also interrogated. Spectral Doppler was utilized to evaluate flow at rest and with distal augmentation maneuvers in the common femoral, femoral and popliteal veins. COMPARISON:  None. FINDINGS: RIGHT LOWER EXTREMITY Common Femoral Vein: The common femoral vein is only partially compressible. Wall adherent thrombus is visualized on color Doppler imaging. Positive flow present on color Doppler imaging. Saphenofemoral Junction: No evidence of thrombus. Normal compressibility and flow on color Doppler imaging. Profunda Femoral Vein: No evidence of thrombus. Normal compressibility and flow on color Doppler imaging. Femoral Vein: No evidence of thrombus. Normal compressibility, respiratory phasicity and response to augmentation. Popliteal Vein: No evidence of thrombus. Normal compressibility, respiratory phasicity and response to augmentation. Calf Veins: No evidence of thrombus. Normal compressibility and flow on color Doppler imaging. Superficial Great Saphenous Vein: No evidence of thrombus. Normal compressibility. Venous Reflux:  None. Other Findings:  None. LEFT LOWER EXTREMITY Common Femoral Vein: No evidence of thrombus. Normal compressibility, respiratory phasicity and response to augmentation. Saphenofemoral Junction: No evidence of thrombus. Normal compressibility and flow on color Doppler imaging. Profunda Femoral Vein: No evidence of thrombus. Normal compressibility and flow on color Doppler imaging. Femoral Vein: No evidence of thrombus. Normal compressibility, respiratory phasicity and response to augmentation. Popliteal Vein: Only partially compressible. There is some eccentric wall thickening versus  wall adherent thrombus in the proximal aspect of the popliteal vein. However, distally the vessel becomes completely occluded. Calf Veins: Occlusive thrombus extends into the posterior tibial and peroneal veins. Superficial Great Saphenous Vein: No evidence of thrombus. Normal compressibility. Venous Reflux:  None. Other Findings:  None. IMPRESSION: 1. Positive for nonocclusive wall adherent DVT within the right common femoral vein. 2. Also positive for occlusive DVT in the left popliteal and calf veins. Electronically Signed   By: Jacqulynn Cadet M.D.   On: 01/10/2021 11:35   DG Chest Portable 1 View  Result Date: 01/10/2021 CLINICAL DATA:  right sided chest pain EXAM: PORTABLE CHEST 1 VIEW COMPARISON:  Same day CT. FINDINGS: The cardiomediastinal silhouette is unchanged in contour.Cardiac loop recorder. No pleural effusion. No pneumothorax. Hazy opacities of the RIGHT lateral lung. Visualized abdomen is unremarkable. Advanced degenerative changes of the bilateral shoulders. IMPRESSION: Hazy opacity of the RIGHT lateral lung, possibly reflecting pulmonary infarction given history of pulmonary embolism. Additional differential considerations include infection or aspiration. Electronically Signed   By: Valentino Saxon M.D.   On: 01/10/2021 14:15    Labs on Admission: I have personally reviewed following labs  CBC: Recent Labs  Lab 01/10/21 1140  WBC 9.0  NEUTROABS 6.8  HGB 13.3  HCT 38.6*  MCV 98.7  PLT [id number]   Basic Metabolic Panel: Recent Labs  Lab 01/10/21 1140  NA 138  K 4.1  CL 103  CO2 26  GLUCOSE 105*  BUN 13  CREATININE 1.11  CALCIUM 8.9   GFR: Estimated Creatinine Clearance: 36.9 mL/min (by C-G formula based on SCr of 1.11 mg/dL).  Liver Function Tests: Recent Labs  Lab 01/10/21 1140  AST 19  ALT 14  ALKPHOS 66  BILITOT 1.0  PROT 6.9  ALBUMIN 3.3*   Coagulation Profile: Recent Labs  Lab 01/10/21 1140  INR 1.1   Dr. Tobie Poet [facility]  If 7PM-7AM,  please contact overnight-coverage provider If 7AM-7PM, please contact day coverage provider [email or web address]  01/10/2021, 2:29 PM

## 2021-01-10 NOTE — ED Notes (Signed)
Request made for transport to the floor ?

## 2021-01-10 NOTE — ED Notes (Signed)
Patient transported to CT 

## 2021-01-10 NOTE — ED Provider Notes (Signed)
[facility] Emergency Department Provider Note  ____________________________________________   Event Date/Time   First MD Initiated Contact with Patient 01/10/21 1048     (approximate)  I have reviewed the triage vital signs and the nursing notes.   HISTORY  Chief Complaint Leg Swelling    HPI Matthew Clayton is a 87 y.o. male with sick sinus syndrome with ICD pacemaker history of DVT in his left leg in the setting of travel who comes in with concerns for left leg swelling.  Patient reported having some left leg pain yesterday and noticed that it was more swollen this morning.  Patient does report that he was previously on aspirin which she stopped on 12/10/2020 when he had a pacemaker placed and did not start taking it again until this a.m.  Patient does report the swelling has been constant, worsening, nothing makes it better or worse.  He denies any chest pain or shortness of breath.  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            Past Medical History:  Diagnosis Date   DVT (deep venous thrombosis) (HCC)    left leg   Hyperlipidemia     Patient Active Problem List   Diagnosis Date Noted   Sick sinus syndrome ([place]) 12/09/2020   Aortic valve sclerosis 07/18/2018   Irregular heart rhythm 04/27/2018   Cellulitis of upper limb 03/09/2017   History of DVT (deep vein thrombosis) 06/07/2016   Fatigue 12/02/2015   Myalgia 12/02/2015   Abnormal chest x-ray 11/25/2015   Anal fissure 11/25/2015   Decrease in the ability to hear 11/25/2015   Clergyman's knee 11/25/2015   Memory loss 11/25/2015   Anemia 08/04/2015   Essential hypertension 08/04/2015   Allergic rhinitis 08/04/2015   Acid reflux 08/04/2015   Hyperlipidemia 07/28/2015   Malaise and fatigue 11/12/2009   LBP (low back pain) 10/28/2009   Inflammation of eyelid 04/01/2009   Dysfunction of eustachian tube 03/05/2008   Pure hypercholesterolemia  07/05/2007   Arthropathy of pelvic region and thigh 11/22/2006    Past Surgical History:  Procedure Laterality Date   APPENDECTOMY     MOLE REMOVAL     PACEMAKER LEADLESS INSERTION N/A 12/09/2020   Procedure: PACEMAKER LEADLESS INSERTION;  Surgeon: Isaias Cowman, MD;  Location: [facility];  Service: Cardiovascular;  Laterality: N/A;   TONSILLECTOMY AND ADENOIDECTOMY     TOTAL HIP ARTHROPLASTY Bilateral     Prior to Admission medications   Medication Sig Start Date End Date Taking? Authorizing Provider  aspirin EC 81 MG tablet Take 81 mg by mouth daily.    [provider]  benzonatate (TESSALON) 100 MG capsule Take 1 capsule (100 mg total) by mouth 3 (three) times daily as needed for cough. 12/09/20   Jerrol Banana., MD  cetirizine (ZYRTEC) 10 MG tablet Take 1 tablet (10 mg total) by mouth daily. 06/16/16   Jerrol Banana., MD  Multiple Vitamins-Minerals (CENTRUM SILVER PO) Take 1 tablet by mouth daily at 6 (six) AM.    [provider]  predniSONE (DELTASONE) 5 MG tablet Taper down by 1 tablet daily by mouth starting at 6 tablets day 1, 5 day 2, 4 day 3, 3 day 4, 2 day 5 and 1 day 6. Divide dosage among meals and bedtime each day. 12/12/20   Chrismon, Vickki Muff, PA-C  simvastatin (ZOCOR) 40 MG tablet TAKE 1 TABLET (40 MG  TOTAL) BY MOUTH DAILY. NEEDS APPT FOR FURTHER RX. 11/28/19   Jerrol Banana., MD    Allergies Patient has no known allergies.  Family History  Problem Relation Age of Onset   Breast cancer Mother    Cancer Mother        eye cancer   Heart disease Father    Breast cancer Sister    Stroke Brother    Heart attack Brother    Breast cancer Sister    Aneurysm Sister     Social History Social History   Tobacco Use   Smoking status: Former    Packs/day: 1.50    Years: 20.00    Pack years: 30.00    Types: Cigarettes    Quit date: 05/16/1960    Years since quitting: 60.6   Smokeless tobacco: Never   Tobacco  comments:    quit in 1968  Substance Use Topics   Alcohol use: No    Alcohol/week: 0.0 standard drinks   Drug use: No      Review of Systems Constitutional: No fever/chills Eyes: No visual changes. ENT: No sore throat. Cardiovascular: Denies chest pain. Respiratory: Denies shortness of breath. Gastrointestinal: No abdominal pain.  No nausea, no vomiting.  No diarrhea.  No constipation. Genitourinary: Negative for dysuria. Musculoskeletal: Leg swelling Skin: Negative for rash. Neurological: Negative for headaches, focal weakness or numbness. All other ROS negative ____________________________________________   PHYSICAL EXAM:  VITAL SIGNS: ED Triage Vitals [01/10/21 1017]  Enc Vitals Group     BP (!) 111/51     Pulse Rate 80     Resp 20     Temp 97.9 F (36.6 C)     Temp Source Oral     SpO2 100 %     Weight 148 lb (67.1 kg)     Height '5\' 5"'$  (1.651 m)     Head Circumference      Peak Flow      Pain Score 1     Pain Loc      Pain Edu?      Excl. in [place]?     Constitutional: Alert and oriented. Well appearing and in no acute distress. Eyes: Conjunctivae are normal. EOMI. Head: Atraumatic. Nose: No congestion/rhinnorhea. Mouth/Throat: Mucous membranes are moist.   Neck: No stridor. Trachea Midline. FROM Cardiovascular: Normal rate, regular rhythm. Grossly normal heart sounds.  Good peripheral circulation. Respiratory: Normal respiratory effort.  No retractions. Lungs CTAB. Gastrointestinal: Soft and nontender. No distention. No abdominal bruits.  Musculoskeletal: Left leg larger than the right leg.  2+ distal pulses.  No obvious skin changes. Neurologic:  Normal speech and language. No gross focal neurologic deficits are appreciated.  Skin:  Skin is warm, dry and intact. No rash noted. Psychiatric: Mood and affect are normal. Speech and behavior are normal. GU: Deferred   ____________________________________________   LABS (all labs ordered are listed, but  only abnormal results are displayed)  Labs Reviewed  CBC WITH DIFFERENTIAL/PLATELET  COMPREHENSIVE METABOLIC PANEL  URINALYSIS, ROUTINE W REFLEX MICROSCOPIC  BRAIN NATRIURETIC PEPTIDE  PROTIME-INR  APTT   ____________________________________________   ED ECG REPORT I, Vanessa Polo, the attending physician, personally viewed and interpreted this ECG.  Normal sinus rate of 70, no ST elevation, no T wave inversions, normal intervals ____________________________________________  RADIOLOGY Robert Bellow, personally viewed and evaluated these images (plain radiographs) as part of my medical decision making, as well as reviewing the written report by the radiologist.  ED MD  interpretation:  no pna   Official radiology report(s): US Venous Img Lower Bilateral (DVT)  Result Date: 01/10/2021 CLINICAL DATA:  87 year old male with lower extremity edema EXAM: BILATERAL LOWER EXTREMITY VENOUS DOPPLER ULTRASOUND TECHNIQUE: Gray-scale sonography with graded compression, as well as color Doppler and duplex ultrasound were performed to evaluate the lower extremity deep venous systems from the level of the common femoral vein and including the common femoral, femoral, profunda femoral, popliteal and calf veins including the posterior tibial, peroneal and gastrocnemius veins when visible. The superficial great saphenous vein was also interrogated. Spectral Doppler was utilized to evaluate flow at rest and with distal augmentation maneuvers in the common femoral, femoral and popliteal veins. COMPARISON:  None. FINDINGS: RIGHT LOWER EXTREMITY Common Femoral Vein: The common femoral vein is only partially compressible. Wall adherent thrombus is visualized on color Doppler imaging. Positive flow present on color Doppler imaging. Saphenofemoral Junction: No evidence of thrombus. Normal compressibility and flow on color Doppler imaging. Profunda Femoral Vein: No evidence of thrombus. Normal compressibility and  flow on color Doppler imaging. Femoral Vein: No evidence of thrombus. Normal compressibility, respiratory phasicity and response to augmentation. Popliteal Vein: No evidence of thrombus. Normal compressibility, respiratory phasicity and response to augmentation. Calf Veins: No evidence of thrombus. Normal compressibility and flow on color Doppler imaging. Superficial Great Saphenous Vein: No evidence of thrombus. Normal compressibility. Venous Reflux:  None. Other Findings:  None. LEFT LOWER EXTREMITY Common Femoral Vein: No evidence of thrombus. Normal compressibility, respiratory phasicity and response to augmentation. Saphenofemoral Junction: No evidence of thrombus. Normal compressibility and flow on color Doppler imaging. Profunda Femoral Vein: No evidence of thrombus. Normal compressibility and flow on color Doppler imaging. Femoral Vein: No evidence of thrombus. Normal compressibility, respiratory phasicity and response to augmentation. Popliteal Vein: Only partially compressible. There is some eccentric wall thickening versus wall adherent thrombus in the proximal aspect of the popliteal vein. However, distally the vessel becomes completely occluded. Calf Veins: Occlusive thrombus extends into the posterior tibial and peroneal veins. Superficial Great Saphenous Vein: No evidence of thrombus. Normal compressibility. Venous Reflux:  None. Other Findings:  None. IMPRESSION: 1. Positive for nonocclusive wall adherent DVT within the right common femoral vein. 2. Also positive for occlusive DVT in the left popliteal and calf veins. Electronically Signed   By: Jacqulynn Cadet M.D.   On: 01/10/2021 11:35    ____________________________________________   PROCEDURES  Procedure(s) performed (including Critical Care):  .Critical Care  Date/Time: 01/10/2021 2:10 PM Performed by: Vanessa [place], MD Authorized by: Vanessa Abern[place], MD   Critical care provider statement:    Critical care time (minutes):  45    Critical care was necessary to treat or prevent imminent or life-threatening deterioration of the following conditions:  Respiratory failure   Critical care was time spent personally by me on the following activities:  Discussions with consultants, evaluation of patient's response to treatment, examination of patient, ordering and performing treatments and interventions, ordering and review of laboratory studies, ordering and review of radiographic studies, pulse oximetry, re-evaluation of patient's condition, obtaining history from patient or surrogate and review of old charts .1-3 Lead EKG Interpretation  Date/Time: 01/10/2021 2:10 PM Performed by: Vanessa [place], MD Authorized by: Vanessa , MD     Interpretation: normal     ECG rate:  70s   Rhythm: sinus rhythm     Ectopy: none     Conduction: normal     ____________________________________________   INITIAL IMPRESSION / ASSESSMENT  AND PLAN / ED COURSE  SYLAS ABILA was evaluated in Emergency Department on 01/10/2021 for the symptoms described in the history of present illness. He was evaluated in the context of the global COVID-19 pandemic, which necessitated consideration that the patient might be at risk for infection with the SARS-CoV-2 virus that causes COVID-19. Institutional protocols and algorithms that pertain to the evaluation of patients at risk for COVID-19 are in a state of rapid change based on information released by regulatory bodies including the CDC and federal and state organizations. These policies and algorithms were followed during the patient's care in the ED.    Patient is a 87 year old who comes in with left leg swelling in setting of recent hospitalization and surgery within the last month.  Ultrasound was ordered to evaluate for DVT.  Denies any trauma to suggest fractures.  Patient has distal pulse unlikely arterial issue.  No evidence of cellulitis or abscess.  We will proceed with labs due to my high  suspicion for blood clot.  DVT ultrasound does confirm blood clots.  Reevaluated patient continues to have a 2+ distal pulses and no skin changes noted over his legs.  I discussed with patient if he has any evidence of shortness of breath he does report for the past 2 to 3 days has had some exertional shortness of breath.  Will get CT PE to make sure no evidence of pulmonary embolism that would require hospital admission.  Discussed case with Dr. Lowella Grip from vascular -clots not large enough for thrombectomy so recommend [facility] for now.   CT PE concerning for bilateral clots with right heart strain as well as evolving pulmonary infarct.  Will start on heparin.  Has not hit his head recently or had any issues with bleeding previously.  Will let vascular surgery know about patient as well in case that they feel that this needs thrombectomy   After the CT scan he had a little bit of chest pain and so a repeat chest x-ray was done just to make sure no evidence of pneumothorax.  On my read of the chest x-ray there is no evidence of pneumothorax.  Let Dr. Lowella Grip know that I will be admitting patient for heparin.               ____________________________________________   FINAL CLINICAL IMPRESSION(S) / ED DIAGNOSES   Final diagnoses:  Acute pulmonary embolism, unspecified pulmonary embolism type, unspecified whether acute cor pulmonale present ([place])  Acute deep vein thrombosis (DVT) of other specified vein of both lower extremities ([place])      MEDICATIONS GIVEN DURING THIS VISIT:  Medications  iohexol (OMNIPAQUE) 350 MG/ML injection 75 mL (75 mLs Intravenous Contrast Given 01/10/21 1329)     ED Discharge Orders     None        Note:  This document was prepared using Dragon voice recognition software and may include unintentional dictation errors.    Vanessa Destrehan, MD 01/10/21 [phone]

## 2021-01-10 NOTE — ED Notes (Signed)
Pt returns from CT complaining of right sided chest pain that increases on inspiration while on CT table. Pt rated stabbing pain a 5 on 0-10 scale. MD made aware, chest xray orders placed.

## 2021-01-10 NOTE — ED Notes (Signed)
MD Mondy at bedside for vascular consult

## 2021-01-10 NOTE — ED Triage Notes (Signed)
Pt with hx of DVT in left leg. Pt states left leg seems swollen since yesterday. Pt states he stopped taking ASA about 12/10/2020 when he had pacemaker placed and did not start taking it again until this AM. Pt with cough, states he has ulcer near voice box that is being treated by PCP. Pt with swelling to  left knee, tender to touch, no redness noted. Pt able to walk, gait steady but pt states he is moving slower than usual due to pain.

## 2021-01-10 NOTE — ED Notes (Signed)
Pt given urinal per request. States no need for assistance

## 2021-01-10 NOTE — ED Notes (Signed)
US at bedside

## 2021-01-10 NOTE — ED Notes (Signed)
Family updated as to patient's status of bed placement and POC per vascular surgery.

## 2021-01-10 NOTE — Progress Notes (Signed)
ANTICOAGULATION CONSULT NOTE - Initial Consult  Pharmacy Consult for heparin Indication: pulmonary embolus  No Known Allergies  Patient Measurements: Height: '5\' 5"'$  (165.1 cm) Weight: 67.1 kg (148 lb) IBW/kg (Calculated) : 61.5 Heparin Dosing Weight: 67.1 kg  Vital Signs: Temp: 97.9 F (36.6 C) (08/27 1017) Temp Source: Oral (08/27 1017) BP: 132/117 (08/27 1345) Pulse Rate: 79 (08/27 1345)  Labs: Recent Labs    01/10/21 1140  HGB 13.3  HCT 38.6*  PLT 244  APTT 33  LABPROT 14.3  INR 1.1  CREATININE 1.11  TROPONINIHS 11    Estimated Creatinine Clearance: 36.9 mL/min (by C-G formula based on SCr of 1.11 mg/dL).   Medical History: Past Medical History:  Diagnosis Date   DVT (deep venous thrombosis) (HCC)    left leg   Hyperlipidemia     Medications:  (Not in a hospital admission)  Scheduled:   heparin  5,000 Units Intravenous Once   simvastatin  40 mg Oral QHS   Infusions:   heparin     PRN: acetaminophen **OR** acetaminophen, ondansetron **OR** ondansetron (ZOFRAN) IV Anti-infectives (From admission, onward)    None       Assessment: 89YOM initially presenting with left leg swelling, then developing right sided chest pain on inspiration. Patient was previously taking aspirin at home, but stopped taking with last PTA dose 12/10/2020.  Goal of Therapy:  Heparin level 0.3-0.7 units/ml Monitor platelets by anticoagulation protocol: Yes   Plan:  Give 5,000 units bolus x 1 Start heparin infusion at 1,100 units/hr Check anti-Xa level in 8 hours and daily while on heparin Continue to monitor H&H and platelets   Wynelle Cleveland, PharmD Pharmacy Resident  01/10/2021 2:29 PM

## 2021-01-10 NOTE — ED Notes (Signed)
Assumed care at this time. Pt mild distress noted, difficulty speaking in full sentences with intermittent cough. Pt family states this has been normal since he had a procedure on his larynx. A/ox4, denies current SOB while lying down, CP, dizziness. States all needs being met at this time.

## 2021-01-10 NOTE — Consult Note (Signed)
Reason for Consult:Deep vein thrombosis and pulmonary thromboembolism Referring Physician: Jari Pigg (ED) and Cox (hospitalist)  Matthew Clayton is an 87 y.o. male.  HPI: He has a history of prior deep vein thrombosis of the left lower extremity felt provoked by international travel.  He was treated with anticoagulation at that time.  He recently underwent placement of a pacemaker for which his antiplatelet therapy was held.  He reports approximately 2 to 3 days of left lower extremity swelling and called the vascular office.  He was scheduled for follow-up duplex on Monday.  However he presented to the emergency department complaining of a bit more swelling of the left lower extremity worse with dependency and better with elevation.  On evaluation by the ED physician, he endorsed some exertional shortness of breath as well as some right-sided chest pain with deep inspiration.  He underwent lower extremity duplex which demonstrated some acute on chronic popliteal and tibial deep vein thrombosis on the left and new finding of a partially occlusive, likely acute thrombosis in the right common femoral vein.  He denies any swelling of the right lower extremity.  Additional imaging with CTA demonstrated segmental and subsegmental acute and chronic pulmonary thromboemboli in the right middle and lower lobes as well as the left lower lobe.  There was suggestive evidence of early pulmonary infarction in the middle lobe.  We were initially consulted for the deep vein thrombosis and subsequently for this pulmonary embolism.  On my arrival to the emergency department, he is resting comfortably on the stretcher, on room air.  On questioning, he does have visible dyspnea with conversation and an intermittent cough.  He reports that the cough has been present for a couple weeks.  He endorses pleuritic chest pain on the right side which has become more prominent recently.    Past Medical History:  Diagnosis Date   DVT (deep  venous thrombosis) (HCC)    left leg   Hyperlipidemia     Past Surgical History:  Procedure Laterality Date   APPENDECTOMY     MOLE REMOVAL     PACEMAKER LEADLESS INSERTION N/A 12/09/2020   Procedure: PACEMAKER LEADLESS INSERTION;  Surgeon: Isaias Cowman, MD;  Location: [facility];  Service: Cardiovascular;  Laterality: N/A;   TONSILLECTOMY AND ADENOIDECTOMY     TOTAL HIP ARTHROPLASTY Bilateral     Family History  Problem Relation Age of Onset   Breast cancer Mother    Cancer Mother        eye cancer   Heart disease Father    Breast cancer Sister    Stroke Brother    Heart attack Brother    Breast cancer Sister    Aneurysm Sister     Social History:  reports that he quit smoking about 60 years ago. His smoking use included cigarettes. He has a 30.00 pack-year smoking history. He has never used smokeless tobacco. He reports that he does not drink alcohol and does not use drugs.  Allergies: No Known Allergies  Medications: I have reviewed the patient's current medications.  Results for orders placed or performed during the hospital encounter of 01/10/21 (from the past 48 hour(s))  CBC with Differential     Status: Abnormal   Collection Time: 01/10/21 11:40 AM  Result Value Ref Range   WBC 9.0 4.0 - 10.5 K/uL   RBC 3.91 (L) 4.22 - 5.81 MIL/uL   Hemoglobin 13.3 13.0 - 17.0 g/dL   HCT 38.6 (L) 39.0 - 52.0 %  MCV 98.7 80.0 - 100.0 fL   MCH 34.0 26.0 - 34.0 pg   MCHC 34.5 30.0 - 36.0 g/dL   RDW 14.1 11.5 - 15.5 %   Platelets 244 150 - 400 K/uL   nRBC 0.0 0.0 - 0.2 %   Neutrophils Relative % 76 %   Neutro Abs 6.8 1.7 - 7.7 K/uL   Lymphocytes Relative 12 %   Lymphs Abs 1.1 0.7 - 4.0 K/uL   Monocytes Relative 10 %   Monocytes Absolute 0.9 0.1 - 1.0 K/uL   Eosinophils Relative 2 %   Eosinophils Absolute 0.1 0.0 - 0.5 K/uL   Basophils Relative 0 %   Basophils Absolute 0.0 0.0 - 0.1 K/uL   Immature Granulocytes 0 %   Abs Immature Granulocytes 0.04 0.00 -  0.07 K/uL    Comment: Performed at [facility], [address] [place], [place] [address]  Comprehensive metabolic panel     Status: Abnormal   Collection Time: 01/10/21 11:40 AM  Result Value Ref Range   Sodium 138 135 - 145 mmol/L   Potassium 4.1 3.5 - 5.1 mmol/L   Chloride 103 98 - 111 mmol/L   CO2 26 22 - 32 mmol/L   Glucose, Bld 105 (H) 70 - 99 mg/dL    Comment: Glucose reference range applies only to samples taken after fasting for at least 8 hours.   BUN 13 8 - 23 mg/dL   Creatinine, Ser 1.11 0.61 - 1.24 mg/dL   Calcium 8.9 8.9 - 10.3 mg/dL   Total Protein 6.9 6.5 - 8.1 g/dL   Albumin 3.3 (L) 3.5 - 5.0 g/dL   AST 19 15 - 41 U/L   ALT 14 0 - 44 U/L   Alkaline Phosphatase 66 38 - 126 U/L   Total Bilirubin 1.0 0.3 - 1.2 mg/dL   GFR, Estimated >60 >60 mL/min    Comment: (NOTE) Calculated using the CKD-EPI Creatinine Equation (2021)    Anion gap 9 5 - 15    Comment: Performed at [facility], [address] [place], [place] [address]  Brain natriuretic peptide     Status: Abnormal   Collection Time: 01/10/21 11:40 AM  Result Value Ref Range   B Natriuretic Peptide 185.2 (H) 0.0 - 100.0 pg/mL    Comment: Performed at [facility], [address] [place], [place] [address]  Protime-INR     Status: None   Collection Time: 01/10/21 11:40 AM  Result Value Ref Range   Prothrombin Time 14.3 11.4 - 15.2 seconds   INR 1.1 0.8 - 1.2    Comment: (NOTE) INR goal varies based on device and disease states. Performed at [facility], [address] [place], [place] [address]   APTT     Status: None   Collection Time: 01/10/21 11:40 AM  Result Value Ref Range   aPTT 33 24 - 36 seconds    Comment: Performed at [facility], [place],  [id number]  Troponin I (High Sensitivity)     Status: None   Collection Time: 01/10/21 11:40 AM  Result Value Ref Range   Troponin I (High Sensitivity) 11 <18 ng/L    Comment:  (NOTE) Elevated high sensitivity troponin I (hsTnI) values and significant  changes across serial measurements may suggest ACS but many other  chronic and acute conditions are known to elevate hsTnI results.  Refer to the "Links" section for chest pain algorithms and additional  guidance. Performed at  [address] [place], [place] [address]   Urinalysis, Routine w reflex microscopic     Status: Abnormal   Collection Time: 01/10/21  2:05 PM  Result Value Ref Range   Color, Urine STRAW (A) YELLOW   APPearance CLEAR (A) CLEAR   Specific Gravity, Urine 1.014 1.005 - 1.030   pH 7.0 5.0 - 8.0   Glucose, UA NEGATIVE NEGATIVE mg/dL   Hgb urine dipstick NEGATIVE NEGATIVE   Bilirubin Urine NEGATIVE NEGATIVE   Ketones, ur NEGATIVE NEGATIVE mg/dL   Protein, ur NEGATIVE NEGATIVE mg/dL   Nitrite NEGATIVE NEGATIVE   Leukocytes,Ua NEGATIVE NEGATIVE    Comment: Performed at [facility], [place],  [address]    CT Angio Chest PE W and/or Wo Contrast  Result Date: 01/10/2021 CLINICAL DATA:  Lower extremity DVT. EXAM: CT ANGIOGRAPHY CHEST WITH CONTRAST TECHNIQUE: Multidetector CT imaging of the chest was performed using the standard protocol during bolus administration of intravenous contrast. Multiplanar CT image reconstructions and MIPs were obtained to evaluate the vascular anatomy. CONTRAST:  32m OMNIPAQUE IOHEXOL 350 MG/ML SOLN COMPARISON:  CTA chest dated May 17, 2016. FINDINGS: Cardiovascular: Satisfactory opacification of the pulmonary arteries to the segmental level. Acute occlusive pulmonary emboli involving the right middle lobe and left lower lobe lateral basal segment. More wall adherent near occlusive pulmonary emboli in the bilateral interlobar and lower lobe segmental pulmonary arteries. Mild right heart enlargement with elevated RV/LV ratio of 1.3. No pericardial effusion. Reflux of contrast into the IVC. Mediastinum/Nodes: No  enlarged mediastinal, hilar, or axillary lymph nodes. Thyroid gland, trachea, and esophagus demonstrate no significant findings. Lungs/Pleura: Peripheral ground-glass density in the right middle lobe (series 6, image 51). Trace right pleural effusion. No pneumothorax. Mild subpleural cystic change and increased reticulation in both lungs, slightly progressed since 2018. Upper Abdomen: No acute abnormality. Musculoskeletal: No chest wall abnormality. No acute or significant osseous findings. Review of the MIP images confirms the above findings. IMPRESSION: 1. Acute on chronic occlusive and nonocclusive lobar and segmental pulmonary emboli involving the right middle lobe and both lower lobes. Positive for acute PE with CT evidence of right heart strain (RV/LV Ratio = 1.3) consistent with at least submassive (intermediate risk) PE. The presence of right heart strain has been associated with an increased risk of morbidity and mortality. 2. Peripheral ground-glass density in the right middle lobe, concerning for pulmonary infarct. 3. Trace right pleural effusion. 4. Mild chronic interstitial lung disease, slightly progressed since 2018. Critical Value/emergent results were called by telephone at the time of interpretation on 01/10/2021 at 2:02 pm to provider [facility], who verbally acknowledged these results. Electronically Signed   By: WTitus DubinM.D.   On: 01/10/2021 14:08   UKoreaVenous Img Lower Bilateral (DVT)  Result Date: 01/10/2021 CLINICAL DATA:  87year old male with lower extremity edema EXAM: BILATERAL LOWER EXTREMITY VENOUS DOPPLER ULTRASOUND TECHNIQUE: Gray-scale sonography with graded compression, as well as color Doppler and duplex ultrasound were performed to evaluate the lower extremity deep venous systems from the level of the common femoral vein and including the common femoral, femoral, profunda femoral, popliteal and calf veins including the posterior tibial, peroneal and gastrocnemius veins  when visible. The superficial great saphenous vein was also interrogated. Spectral Doppler was utilized to evaluate flow at rest and with distal augmentation maneuvers in the common femoral, femoral and popliteal veins. COMPARISON:  None. FINDINGS: RIGHT LOWER EXTREMITY Common Femoral Vein: The common femoral vein is  only partially compressible. Wall adherent thrombus is visualized on color Doppler imaging. Positive flow present on color Doppler imaging. Saphenofemoral Junction: No evidence of thrombus. Normal compressibility and flow on color Doppler imaging. Profunda Femoral Vein: No evidence of thrombus. Normal compressibility and flow on color Doppler imaging. Femoral Vein: No evidence of thrombus. Normal compressibility, respiratory phasicity and response to augmentation. Popliteal Vein: No evidence of thrombus. Normal compressibility, respiratory phasicity and response to augmentation. Calf Veins: No evidence of thrombus. Normal compressibility and flow on color Doppler imaging. Superficial Great Saphenous Vein: No evidence of thrombus. Normal compressibility. Venous Reflux:  None. Other Findings:  None. LEFT LOWER EXTREMITY Common Femoral Vein: No evidence of thrombus. Normal compressibility, respiratory phasicity and response to augmentation. Saphenofemoral Junction: No evidence of thrombus. Normal compressibility and flow on color Doppler imaging. Profunda Femoral Vein: No evidence of thrombus. Normal compressibility and flow on color Doppler imaging. Femoral Vein: No evidence of thrombus. Normal compressibility, respiratory phasicity and response to augmentation. Popliteal Vein: Only partially compressible. There is some eccentric wall thickening versus wall adherent thrombus in the proximal aspect of the popliteal vein. However, distally the vessel becomes completely occluded. Calf Veins: Occlusive thrombus extends into the posterior tibial and peroneal veins. Superficial Great Saphenous Vein: No evidence  of thrombus. Normal compressibility. Venous Reflux:  None. Other Findings:  None. IMPRESSION: 1. Positive for nonocclusive wall adherent DVT within the right common femoral vein. 2. Also positive for occlusive DVT in the left popliteal and calf veins. Electronically Signed   By: Jacqulynn Cadet M.D.   On: 01/10/2021 11:35   DG Chest Portable 1 View  Result Date: 01/10/2021 CLINICAL DATA:  right sided chest pain EXAM: PORTABLE CHEST 1 VIEW COMPARISON:  Same day CT. FINDINGS: The cardiomediastinal silhouette is unchanged in contour.Cardiac loop recorder. No pleural effusion. No pneumothorax. Hazy opacities of the RIGHT lateral lung. Visualized abdomen is unremarkable. Advanced degenerative changes of the bilateral shoulders. IMPRESSION: Hazy opacity of the RIGHT lateral lung, possibly reflecting pulmonary infarction given history of pulmonary embolism. Additional differential considerations include infection or aspiration. Electronically Signed   By: Valentino Saxon M.D.   On: 01/10/2021 14:15    Review of Systems  Constitutional: Negative.   HENT: Negative.    Eyes: Negative.   Respiratory:  Positive for cough and shortness of breath.   Cardiovascular:  Positive for chest pain and leg swelling.  Gastrointestinal: Negative.   Endocrine: Negative.   Genitourinary: Negative.   Musculoskeletal: Negative.   Skin: Negative.   Allergic/Immunologic: Negative.   Neurological: Negative.   Hematological: Negative.   Psychiatric/Behavioral: Negative.    Blood pressure (!) 132/117, pulse 79, temperature 97.9 F (36.6 C), temperature source Oral, resp. rate (!) 27, height '5\' 5"'$  (1.651 m), weight 67.1 kg, SpO2 100 %. Physical Exam Vitals and nursing note reviewed.  Constitutional:      General: He is not in acute distress. HENT:     Head: Normocephalic and atraumatic.     Mouth/Throat:     Mouth: Mucous membranes are moist.  Eyes:     Extraocular Movements: Extraocular movements intact.      Conjunctiva/sclera: Conjunctivae normal.  Cardiovascular:     Rate and Rhythm: Normal rate and regular rhythm.     Pulses: Normal pulses.  Pulmonary:     Effort: Pulmonary effort is normal.  Abdominal:     General: Abdomen is flat.  Musculoskeletal:        General: No swelling.     Cervical back:  Neck supple.  Skin:    General: Skin is warm and dry.     Capillary Refill: Capillary refill takes less than 2 seconds.  Neurological:     General: No focal deficit present.     Mental Status: He is alert.  Psychiatric:        Mood and Affect: Mood normal.    Assessment/Plan: 1) pulmonary thromboembolism, acute on chronic with mild right heart strain as demonstrated by elevated BNP and ventricular ratio 1.3. Full heparinization has been started and remains the mainstay of initial therapy.  Patient is at low risk for bleeding complications but his simplified PESI score is relatively high risk primarily because of his advanced age.  We will follow clinically.  Should he have any hemodynamic or pulmonary decompensation then consideration for pulmonary thromboembolectomy will be made.  I have asked Dr. Lucky Cowboy to review his CT scan.  2) acute deep vein thrombosis of the right common femoral vein This is a new finding, and not suspected clinically as he was asymptomatic regarding the right lower extremity.  Nonetheless full heparinization remains the mainstay of initial therapy with transition to chronic anticoagulation.  3) acute on chronic deep vein thrombosis of the popliteal and tibial veins on the left. Treatment as above.    Bertram Savin 01/10/2021, 4:36 PM

## 2021-01-11 ENCOUNTER — Observation Stay (HOSPITAL_COMMUNITY)
Admit: 2021-01-11 | Discharge: 2021-01-11 | Disposition: A | Discharge status: Home / Self Care | Payer: PPO | Attending: Internal Medicine | Admitting: Internal Medicine

## 2021-01-11 DIAGNOSIS — I2699 Other pulmonary embolism without acute cor pulmonale: Secondary | ICD-10-CM | POA: Diagnosis present

## 2021-01-11 DIAGNOSIS — E785 Hyperlipidemia, unspecified: Secondary | ICD-10-CM | POA: Diagnosis present

## 2021-01-11 DIAGNOSIS — K219 Gastro-esophageal reflux disease without esophagitis: Secondary | ICD-10-CM | POA: Diagnosis present

## 2021-01-11 DIAGNOSIS — Z803 Family history of malignant neoplasm of breast: Secondary | ICD-10-CM | POA: Diagnosis not present

## 2021-01-11 DIAGNOSIS — I2693 Single subsegmental pulmonary embolism without acute cor pulmonale: Secondary | ICD-10-CM | POA: Diagnosis not present

## 2021-01-11 DIAGNOSIS — Z96643 Presence of artificial hip joint, bilateral: Secondary | ICD-10-CM | POA: Diagnosis present

## 2021-01-11 DIAGNOSIS — Z86718 Personal history of other venous thrombosis and embolism: Secondary | ICD-10-CM | POA: Diagnosis not present

## 2021-01-11 DIAGNOSIS — I82432 Acute embolism and thrombosis of left popliteal vein: Secondary | ICD-10-CM | POA: Diagnosis present

## 2021-01-11 DIAGNOSIS — Z87891 Personal history of nicotine dependence: Secondary | ICD-10-CM | POA: Diagnosis not present

## 2021-01-11 DIAGNOSIS — I10 Essential (primary) hypertension: Secondary | ICD-10-CM | POA: Diagnosis present

## 2021-01-11 DIAGNOSIS — I495 Sick sinus syndrome: Secondary | ICD-10-CM | POA: Diagnosis present

## 2021-01-11 DIAGNOSIS — G629 Polyneuropathy, unspecified: Secondary | ICD-10-CM | POA: Diagnosis present

## 2021-01-11 DIAGNOSIS — I358 Other nonrheumatic aortic valve disorders: Secondary | ICD-10-CM | POA: Diagnosis present

## 2021-01-11 DIAGNOSIS — Z20822 Contact with and (suspected) exposure to covid-19: Secondary | ICD-10-CM | POA: Diagnosis present

## 2021-01-11 DIAGNOSIS — Z79899 Other long term (current) drug therapy: Secondary | ICD-10-CM | POA: Diagnosis not present

## 2021-01-11 DIAGNOSIS — Z95810 Presence of automatic (implantable) cardiac defibrillator: Secondary | ICD-10-CM | POA: Diagnosis not present

## 2021-01-11 DIAGNOSIS — I82493 Acute embolism and thrombosis of other specified deep vein of lower extremity, bilateral: Secondary | ICD-10-CM

## 2021-01-11 DIAGNOSIS — Z7982 Long term (current) use of aspirin: Secondary | ICD-10-CM | POA: Diagnosis not present

## 2021-01-11 DIAGNOSIS — I82411 Acute embolism and thrombosis of right femoral vein: Secondary | ICD-10-CM | POA: Diagnosis present

## 2021-01-11 DIAGNOSIS — R0902 Hypoxemia: Secondary | ICD-10-CM | POA: Diagnosis not present

## 2021-01-11 DIAGNOSIS — Z8249 Family history of ischemic heart disease and other diseases of the circulatory system: Secondary | ICD-10-CM | POA: Diagnosis not present

## 2021-01-11 DIAGNOSIS — Z823 Family history of stroke: Secondary | ICD-10-CM | POA: Diagnosis not present

## 2021-01-11 DIAGNOSIS — Z7952 Long term (current) use of systemic steroids: Secondary | ICD-10-CM | POA: Diagnosis not present

## 2021-01-11 LAB — BASIC METABOLIC PANEL WITH GFR
Anion gap: 6 (ref 5–15)
BUN: 13 mg/dL (ref 8–23)
CO2: 26 mmol/L (ref 22–32)
Calcium: 8.7 mg/dL — ABNORMAL LOW (ref 8.9–10.3)
Chloride: 104 mmol/L (ref 98–111)
Creatinine, Ser: 1.23 mg/dL (ref 0.61–1.24)
GFR, Estimated: 55 mL/min — ABNORMAL LOW
Glucose, Bld: 126 mg/dL — ABNORMAL HIGH (ref 70–99)
Potassium: 3.7 mmol/L (ref 3.5–5.1)
Sodium: 136 mmol/L (ref 135–145)

## 2021-01-11 LAB — CBC
HCT: 36.8 % — ABNORMAL LOW (ref 39.0–52.0)
Hemoglobin: 12.3 g/dL — ABNORMAL LOW (ref 13.0–17.0)
MCH: 33 pg (ref 26.0–34.0)
MCHC: 33.4 g/dL (ref 30.0–36.0)
MCV: 98.7 fL (ref 80.0–100.0)
Platelets: 269 10*3/uL (ref 150–400)
RBC: 3.73 MIL/uL — ABNORMAL LOW (ref 4.22–5.81)
RDW: 14.4 % (ref 11.5–15.5)
WBC: 8.9 10*3/uL (ref 4.0–10.5)
nRBC: 0 % (ref 0.0–0.2)

## 2021-01-11 LAB — ECHOCARDIOGRAM COMPLETE
AR max vel: 1.42 cm2
AV Area VTI: 1.61 cm2
AV Area mean vel: 1.49 cm2
AV Mean grad: 14 mmHg
AV Peak grad: 27.9 mmHg
Ao pk vel: 2.64 m/s
Height: 65 in
S' Lateral: 2.61 cm
Weight: 2368 oz

## 2021-01-11 LAB — HEPARIN LEVEL (UNFRACTIONATED)
Heparin Unfractionated: 0.31 [IU]/mL (ref 0.30–0.70)
Heparin Unfractionated: 0.24 [IU]/mL — ABNORMAL LOW (ref 0.30–0.70)

## 2021-01-11 MED ORDER — ENSURE ENLIVE PO LIQD
237.0000 mL | Freq: Every day | ORAL | Status: DC
  Administered 2021-01-11 – 2021-01-13 (×2): 237 mL via ORAL

## 2021-01-11 MED ORDER — HEPARIN BOLUS VIA INFUSION
1000.0000 [IU] | Freq: Once | INTRAVENOUS | Status: AC
  Administered 2021-01-11: 1000 [IU] via INTRAVENOUS
  Filled 2021-01-11: qty 1000

## 2021-01-11 MED ORDER — GABAPENTIN 400 MG PO CAPS
400.0000 mg | ORAL_CAPSULE | Freq: Two times a day (BID) | ORAL | Status: DC
  Administered 2021-01-11 – 2021-01-13 (×4): 400 mg via ORAL
  Filled 2021-01-11 (×4): qty 1

## 2021-01-11 MED ORDER — BENZONATATE 100 MG PO CAPS
100.0000 mg | ORAL_CAPSULE | Freq: Three times a day (TID) | ORAL | Status: DC
  Administered 2021-01-11 – 2021-01-13 (×5): 100 mg via ORAL
  Filled 2021-01-11 (×5): qty 1

## 2021-01-11 NOTE — Progress Notes (Signed)
ANTICOAGULATION CONSULT NOTE   Pharmacy Consult for heparin Indication: pulmonary embolus  No Known Allergies  Patient Measurements: Height: '5\' 5"'$  (165.1 cm) Weight: 67.1 kg (148 lb) IBW/kg (Calculated) : 61.5 Heparin Dosing Weight: 67.1 kg  Vital Signs: Temp: 97.8 F (36.6 C) (08/27 2348) BP: 110/65 (08/27 2348) Pulse Rate: 74 (08/27 2348)  Labs: Recent Labs    01/10/21 1140 01/10/21 2254  HGB 13.3  --   HCT 38.6*  --   PLT 244  --   APTT 33  --   LABPROT 14.3  --   INR 1.1  --   HEPARINUNFRC  --  0.21*  CREATININE 1.11  --   TROPONINIHS 11  --      Estimated Creatinine Clearance: 36.9 mL/min (by C-G formula based on SCr of 1.11 mg/dL).   Medical History: Past Medical History:  Diagnosis Date   DVT (deep venous thrombosis) (HCC)    left leg   Hyperlipidemia     Medications:  Medications Prior to Admission  Medication Sig Dispense Refill Last Dose   aspirin EC 81 MG tablet Take 81 mg by mouth daily.   01/10/2021   gabapentin (NEURONTIN) 100 MG capsule Take 100-600 mg by mouth 3 (three) times daily.   01/09/2021 at 2000   benzonatate (TESSALON) 100 MG capsule Take 1 capsule (100 mg total) by mouth 3 (three) times daily as needed for cough. 30 capsule 1 unknown at prn   cetirizine (ZYRTEC) 10 MG tablet Take 1 tablet (10 mg total) by mouth daily. (Patient not taking: No sig reported) 30 tablet 11 Not Taking   Multiple Vitamins-Minerals (CENTRUM SILVER PO) Take 1 tablet by mouth daily at 6 (six) AM. (Patient not taking: No sig reported)   Not Taking   predniSONE (DELTASONE) 5 MG tablet Taper down by 1 tablet daily by mouth starting at 6 tablets day 1, 5 day 2, 4 day 3, 3 day 4, 2 day 5 and 1 day 6. Divide dosage among meals and bedtime each day. (Patient not taking: No sig reported) 21 tablet 0 Not Taking   simvastatin (ZOCOR) 40 MG tablet TAKE 1 TABLET (40 MG TOTAL) BY MOUTH DAILY. NEEDS APPT FOR FURTHER RX. (Patient not taking: No sig reported) 90 tablet 3 Not  Taking   Scheduled:   gabapentin  400 mg Oral q morning   And   gabapentin  300 mg Oral q1600   And   gabapentin  300 mg Oral QHS   simvastatin  40 mg Oral QHS   Infusions:   heparin 1,100 Units/hr (01/10/21 1459)   PRN: acetaminophen **OR** acetaminophen, diltiazem, ondansetron **OR** ondansetron (ZOFRAN) IV Anti-infectives (From admission, onward)    None       Assessment: Matthew Clayton initially presenting with left leg swelling, then developing right sided chest pain on inspiration. Patient was previously taking aspirin at home, but stopped taking with last PTA dose 12/10/2020.  Goal of Therapy:  Heparin level 0.3-0.7 units/ml Monitor platelets by anticoagulation protocol: Yes  8/27 2254 HL 0.21, subtherapeutic    Plan:  Give 1000 units bolus x 1 Increase heparin infusion to 1250 units/hr Recheck HL in 8 hr after rate change Daily CBC while on heparin  Renda Rolls, PharmD, [facility] 01/11/2021 1:18 AM

## 2021-01-11 NOTE — Progress Notes (Signed)
Patient resting comfortably on afternoon rounds.  Hemodynamics remained satisfactory and is arterial saturation satisfactory.  Echocardiogram noted.  Mild enlargement of the right ventricle but normal systolic function and normal PA pressures.  Will continue full anticoagulation.  Dr. Lucky Cowboy or Dr. Delana Meyer to reassess in the morning regarding possibility of pulmonary thrombectomy.  Note he has been made n.p.o. after midnight.

## 2021-01-11 NOTE — Progress Notes (Signed)
[facility] for heparin Indication: pulmonary embolus  No Known Allergies  Patient Measurements: Height: '5\' 5"'$  (165.1 cm) Weight: 67.1 kg (148 lb) IBW/kg (Calculated) : 61.5 Heparin Dosing Weight: 67.1 kg  Vital Signs: Temp: 98.9 F (37.2 C) (08/28 0825) BP: 98/58 (08/28 0825) Pulse Rate: 76 (08/28 0825)  Labs: Recent Labs    01/10/21 1140 01/10/21 2254 01/11/21 0528 01/11/21 0922  HGB 13.3  --  12.3*  --   HCT 38.6*  --  36.8*  --   PLT 244  --  269  --   APTT 33  --   --   --   LABPROT 14.3  --   --   --   INR 1.1  --   --   --   HEPARINUNFRC  --  0.21*  --  0.31  CREATININE 1.11  --  1.23  --   TROPONINIHS 11  --   --   --      Estimated Creatinine Clearance: 33.3 mL/min (by C-G formula based on SCr of 1.23 mg/dL).   Medical History: Past Medical History:  Diagnosis Date   DVT (deep venous thrombosis) (HCC)    left leg   Hyperlipidemia     Medications:  Medications Prior to Admission  Medication Sig Dispense Refill Last Dose   aspirin EC 81 MG tablet Take 81 mg by mouth daily.   01/10/2021   gabapentin (NEURONTIN) 100 MG capsule Take 100-600 mg by mouth 3 (three) times daily.   01/09/2021 at 2000   benzonatate (TESSALON) 100 MG capsule Take 1 capsule (100 mg total) by mouth 3 (three) times daily as needed for cough. 30 capsule 1 unknown at prn   simvastatin (ZOCOR) 40 MG tablet TAKE 1 TABLET (40 MG TOTAL) BY MOUTH DAILY. NEEDS APPT FOR FURTHER RX. (Patient not taking: No sig reported) 90 tablet 3 Not Taking   Scheduled:   gabapentin  400 mg Oral q morning   And   gabapentin  300 mg Oral q1600   And   gabapentin  300 mg Oral QHS   simvastatin  40 mg Oral QHS   Infusions:   heparin 1,250 Units/hr (01/11/21 0630)   PRN: acetaminophen **OR** acetaminophen, diltiazem, ondansetron **OR** ondansetron (ZOFRAN) IV Anti-infectives (From admission, onward)    None       Assessment: 89YOM initially presenting with  left leg swelling, then developing right sided chest pain on inspiration. Patient was previously taking aspirin at home, but stopped taking with last PTA dose 12/10/2020.  Goal of Therapy:  Heparin level 0.3-0.7 units/ml Monitor platelets by anticoagulation protocol: Yes  8/27 2254 HL 0.21, subtherapeutic @ 1100 units/hr 8/28 0922 HL 0.31, therapeutic @ 1250 units/hr    Plan:  Continue heparin infusion @ 1250 units/hr Recheck HL in 8 hr for confirmation Daily CBC while on heparin  Lu Duffel, PharmD, BCPS Clinical Pharmacist 01/11/2021 10:16 AM

## 2021-01-11 NOTE — H&P (View-Only) (Signed)
Patient resting comfortably on afternoon rounds.  Hemodynamics remained satisfactory and is arterial saturation satisfactory.  Echocardiogram noted.  Mild enlargement of the right ventricle but normal systolic function and normal PA pressures.  Will continue full anticoagulation.  Dr. Lucky Cowboy or Dr. Delana Meyer to reassess in the morning regarding possibility of pulmonary thrombectomy.  Note he has been made n.p.o. after midnight.

## 2021-01-11 NOTE — Progress Notes (Signed)
Subjective: Interval History: has complaints of right-sided pleuritic chest pain making it difficult to find a comfortable position.  Still with difficulty speaking long sentences due to cough..   Objective: Vital signs in last 24 hours: Temp:  [97.8 F (36.6 C)-99.5 F (37.5 C)] 98.9 F (37.2 C) (08/28 0825) Pulse Rate:  [57-93] 76 (08/28 0825) Resp:  [9-27] 16 (08/28 0825) BP: (98-132)/(51-117) 98/58 (08/28 0825) SpO2:  [90 %-100 %] 98 % (08/28 0825) Weight:  [67.1 kg] 67.1 kg (08/27 1017)  Intake/Output from previous day: 08/27 0701 - 08/28 0700 In: 364.4 [P.O.:150; I.V.:214.4] Out: 250 [Urine:250] Intake/Output this shift: No intake/output data recorded.  General appearance: alert, no distress, and appears to breathe comfortably at rest but significant difficulty with conversation Resp: Respiratory effort overall normal but significant bouts of coughing.  Lab Results: Recent Labs    01/10/21 1140 01/11/21 0528  WBC 9.0 8.9  HGB 13.3 12.3*  HCT 38.6* 36.8*  PLT 244 269   BMET Recent Labs    01/10/21 1140 01/11/21 0528  NA 138 136  K 4.1 3.7  CL 103 104  CO2 26 26  GLUCOSE 105* 126*  BUN 13 13  CREATININE 1.11 1.23  CALCIUM 8.9 8.7*    Studies/Results: CT Angio Chest PE W and/or Wo Contrast  Result Date: 01/10/2021 CLINICAL DATA:  Lower extremity DVT. EXAM: CT ANGIOGRAPHY CHEST WITH CONTRAST TECHNIQUE: Multidetector CT imaging of the chest was performed using the standard protocol during bolus administration of intravenous contrast. Multiplanar CT image reconstructions and MIPs were obtained to evaluate the vascular anatomy. CONTRAST:  78m OMNIPAQUE IOHEXOL 350 MG/ML SOLN COMPARISON:  CTA chest dated May 17, 2016. FINDINGS: Cardiovascular: Satisfactory opacification of the pulmonary arteries to the segmental level. Acute occlusive pulmonary emboli involving the right middle lobe and left lower lobe lateral basal segment. More wall adherent near occlusive  pulmonary emboli in the bilateral interlobar and lower lobe segmental pulmonary arteries. Mild right heart enlargement with elevated RV/LV ratio of 1.3. No pericardial effusion. Reflux of contrast into the IVC. Mediastinum/Nodes: No enlarged mediastinal, hilar, or axillary lymph nodes. Thyroid gland, trachea, and esophagus demonstrate no significant findings. Lungs/Pleura: Peripheral ground-glass density in the right middle lobe (series 6, image 51). Trace right pleural effusion. No pneumothorax. Mild subpleural cystic change and increased reticulation in both lungs, slightly progressed since 2018. Upper Abdomen: No acute abnormality. Musculoskeletal: No chest wall abnormality. No acute or significant osseous findings. Review of the MIP images confirms the above findings. IMPRESSION: 1. Acute on chronic occlusive and nonocclusive lobar and segmental pulmonary emboli involving the right middle lobe and both lower lobes. Positive for acute PE with CT evidence of right heart strain (RV/LV Ratio = 1.3) consistent with at least submassive (intermediate risk) PE. The presence of right heart strain has been associated with an increased risk of morbidity and mortality. 2. Peripheral ground-glass density in the right middle lobe, concerning for pulmonary infarct. 3. Trace right pleural effusion. 4. Mild chronic interstitial lung disease, slightly progressed since 2018. Critical Value/emergent results were called by telephone at the time of interpretation on 01/10/2021 at 2:02 pm [facility], who verbally acknowledged these results. Electronically Signed   By: WTitus DubinM.D.   On: 01/10/2021 14:08   UKoreaVenous Img Lower Bilateral (DVT)  Result Date: 01/10/2021 CLINICAL DATA:  87year old male with lower extremity edema EXAM: BILATERAL LOWER EXTREMITY VENOUS DOPPLER ULTRASOUND TECHNIQUE: Gray-scale sonography with graded compression, as well as color Doppler and duplex  ultrasound were performed to evaluate  the lower extremity deep venous systems from the level of the common femoral vein and including the common femoral, femoral, profunda femoral, popliteal and calf veins including the posterior tibial, peroneal and gastrocnemius veins when visible. The superficial great saphenous vein was also interrogated. Spectral Doppler was utilized to evaluate flow at rest and with distal augmentation maneuvers in the common femoral, femoral and popliteal veins. COMPARISON:  None. FINDINGS: RIGHT LOWER EXTREMITY Common Femoral Vein: The common femoral vein is only partially compressible. Wall adherent thrombus is visualized on color Doppler imaging. Positive flow present on color Doppler imaging. Saphenofemoral Junction: No evidence of thrombus. Normal compressibility and flow on color Doppler imaging. Profunda Femoral Vein: No evidence of thrombus. Normal compressibility and flow on color Doppler imaging. Femoral Vein: No evidence of thrombus. Normal compressibility, respiratory phasicity and response to augmentation. Popliteal Vein: No evidence of thrombus. Normal compressibility, respiratory phasicity and response to augmentation. Calf Veins: No evidence of thrombus. Normal compressibility and flow on color Doppler imaging. Superficial Great Saphenous Vein: No evidence of thrombus. Normal compressibility. Venous Reflux:  None. Other Findings:  None. LEFT LOWER EXTREMITY Common Femoral Vein: No evidence of thrombus. Normal compressibility, respiratory phasicity and response to augmentation. Saphenofemoral Junction: No evidence of thrombus. Normal compressibility and flow on color Doppler imaging. Profunda Femoral Vein: No evidence of thrombus. Normal compressibility and flow on color Doppler imaging. Femoral Vein: No evidence of thrombus. Normal compressibility, respiratory phasicity and response to augmentation. Popliteal Vein: Only partially compressible. There is some eccentric wall thickening versus wall adherent thrombus  in the proximal aspect of the popliteal vein. However, distally the vessel becomes completely occluded. Calf Veins: Occlusive thrombus extends into the posterior tibial and peroneal veins. Superficial Great Saphenous Vein: No evidence of thrombus. Normal compressibility. Venous Reflux:  None. Other Findings:  None. IMPRESSION: 1. Positive for nonocclusive wall adherent DVT within the right common femoral vein. 2. Also positive for occlusive DVT in the left popliteal and calf veins. Electronically Signed   By: Jacqulynn Cadet M.D.   On: 01/10/2021 11:35   DG Chest Portable 1 View  Result Date: 01/10/2021 CLINICAL DATA:  right sided chest pain EXAM: PORTABLE CHEST 1 VIEW COMPARISON:  Same day CT. FINDINGS: The cardiomediastinal silhouette is unchanged in contour.Cardiac loop recorder. No pleural effusion. No pneumothorax. Hazy opacities of the RIGHT lateral lung. Visualized abdomen is unremarkable. Advanced degenerative changes of the bilateral shoulders. IMPRESSION: Hazy opacity of the RIGHT lateral lung, possibly reflecting pulmonary infarction given history of pulmonary embolism. Additional differential considerations include infection or aspiration. Electronically Signed   By: Valentino Saxon M.D.   On: 01/10/2021 14:15   Anti-infectives: Anti-infectives (From admission, onward)    None       Assessment/Plan: Right pulmonary infarction, bilateral segmental pulmonary thromboemboli, bilateral deep vein thrombosis. I had a long discussion with the patient and his family this morning.  He remains hemodynamically stable and actually breathes comfortably at rest.  He has significant difficulty with talking with intermittent coughing.  It is difficult to ascertain the acuity of the symptoms as family reports this has been going on since pacemaker placement.  I suspect that the pulmonary infarction noted on the right is the most likely culprit and causing his cough and routine supportive care including  pain control and mild cough suppression should be instituted.  He has evidence of pulmonary thromboembolic disease (intermediate high risk) for which aggressive full anticoagulation remains the initial treatment of choice.  Pulmonary thrombectomy can be considered if he does not demonstrate clinical improvement.  However it is unlikely that intervention will relieve pleuritic symptoms caused by his pulmonary infarction.   LOS: 0 days   Bertram Savin 01/11/2021, 9:55 AM

## 2021-01-11 NOTE — Progress Notes (Signed)
ANTICOAGULATION CONSULT NOTE   Pharmacy Consult for heparin Indication: pulmonary embolus  No Known Allergies  Patient Measurements: Height: '5\' 5"'$  (165.1 cm) Weight: 67.1 kg (148 lb) IBW/kg (Calculated) : 61.5 Heparin Dosing Weight: 67.1 kg  Vital Signs: Temp: 98.6 F (37 C) (08/28 1812) Temp Source: Oral (08/28 1812) BP: 120/62 (08/28 1812) Pulse Rate: 77 (08/28 1812)  Labs: Recent Labs    01/10/21 1140 01/10/21 2254 01/11/21 0528 01/11/21 0922 01/11/21 1758  HGB 13.3  --  12.3*  --   --   HCT 38.6*  --  36.8*  --   --   PLT 244  --  269  --   --   APTT 33  --   --   --   --   LABPROT 14.3  --   --   --   --   INR 1.1  --   --   --   --   HEPARINUNFRC  --  0.21*  --  0.31 0.24*  CREATININE 1.11  --  1.23  --   --   TROPONINIHS 11  --   --   --   --      Estimated Creatinine Clearance: 33.3 mL/min (by C-G formula based on SCr of 1.23 mg/dL).   Medical History: Past Medical History:  Diagnosis Date   DVT (deep venous thrombosis) (HCC)    left leg   Hyperlipidemia     Medications:  Medications Prior to Admission  Medication Sig Dispense Refill Last Dose   aspirin EC 81 MG tablet Take 81 mg by mouth daily.   01/10/2021   gabapentin (NEURONTIN) 100 MG capsule Take 100-600 mg by mouth 3 (three) times daily.   01/09/2021 at 2000   benzonatate (TESSALON) 100 MG capsule Take 1 capsule (100 mg total) by mouth 3 (three) times daily as needed for cough. 30 capsule 1 unknown at prn   simvastatin (ZOCOR) 40 MG tablet TAKE 1 TABLET (40 MG TOTAL) BY MOUTH DAILY. NEEDS APPT FOR FURTHER RX. (Patient not taking: No sig reported) 90 tablet 3 Not Taking   Scheduled:   benzonatate  100 mg Oral TID   feeding supplement  237 mL Oral Daily   gabapentin  400 mg Oral BID   simvastatin  40 mg Oral QHS   Infusions:   heparin 1,250 Units/hr (01/11/21 1651)   PRN: acetaminophen **OR** acetaminophen, ondansetron **OR** ondansetron (ZOFRAN) IV Anti-infectives (From admission,  onward)    None       Assessment: 89YOM initially presenting with left leg swelling, then developing right sided chest pain on inspiration. Patient was previously taking aspirin at home, but stopped taking with last PTA dose 12/10/2020.  Goal of Therapy:  Heparin level 0.3-0.7 units/ml Monitor platelets by anticoagulation protocol: Yes  8/27 2254 HL 0.21, subtherapeutic @ 1100 units/hr 8/28 0922 HL 0.31, therapeutic @ 1250 units/hr  8/28 1758 HL 0.24, subtherapeutic    Plan:  Confirmed with nurse, no significant stoppage of infusion Bolus heparin 1000 units x 1  Increase heparin infusion to 1400 units/hr Recheck HL in 8 hr following rate change  Daily CBC while on heparin  Dorothe Pea, PharmD, BCPS Clinical Pharmacist 01/11/2021 6:28 PM

## 2021-01-11 NOTE — Progress Notes (Signed)
[facility] NAME: Eamon Galves    MR#:  [id number]  DATE OF BIRTH:  12/18/1926  SUBJECTIVE:  patient came in with left lower extremity swelling. Wife and son in the room. Patient has been having some intermittent cough. Denies any chest pain or shortness of breath. REVIEW OF SYSTEMS:   ROS Tolerating Diet: Tolerating PT:   DRUG ALLERGIES:  No Known Allergies  VITALS:  Blood pressure (!) 109/59, pulse 78, temperature 98.4 F (36.9 C), temperature source Oral, resp. rate 17, height '5\' 5"'$  (1.651 m), weight 67.1 kg, SpO2 97 %.  PHYSICAL EXAMINATION:   Physical Exam  GENERAL:  87 y.o.-year-old patient lying in the bed with no acute distress.  LUNGS: Normal breath sounds bilaterally, no wheezing, rales, rhonchi. No use of accessory muscles of respiration.  CARDIOVASCULAR: S1, S2 normal. No murmurs, rubs, or gallops.  ABDOMEN: Soft, nontender, nondistended. Bowel sounds present. No organomegaly or mass.  EXTREMITIES: + edema b/l.   No calf tenderness NEUROLOGIC: Cranial nerves II through XII are intact. No focal Motor or sensory deficits b/l.   PSYCHIATRIC:  patient is alert and oriented x 3.  SKIN: No obvious rash, lesion, or ulcer.   LABORATORY PANEL:  CBC Recent Labs  Lab 01/11/21 0528  WBC 8.9  HGB 12.3*  HCT 36.8*  PLT 269    Chemistries  Recent Labs  Lab 01/10/21 1140 01/11/21 0528  NA 138 136  K 4.1 3.7  CL 103 104  CO2 26 26  GLUCOSE 105* 126*  BUN 13 13  CREATININE 1.11 1.23  CALCIUM 8.9 8.7*  AST 19  --   ALT 14  --   ALKPHOS 66  --   BILITOT 1.0  --    Cardiac Enzymes No results for input(s): TROPONINI in the last 168 hours. RADIOLOGY:  CT Angio Chest PE W and/or Wo Contrast  Result Date: 01/10/2021 CLINICAL DATA:  Lower extremity DVT. EXAM: CT ANGIOGRAPHY CHEST WITH CONTRAST TECHNIQUE: Multidetector CT imaging of the chest was performed using the standard protocol during bolus administration  of intravenous contrast. Multiplanar CT image reconstructions and MIPs were obtained to evaluate the vascular anatomy. CONTRAST:  26m OMNIPAQUE IOHEXOL 350 MG/ML SOLN COMPARISON:  CTA chest dated May 17, 2016. FINDINGS: Cardiovascular: Satisfactory opacification of the pulmonary arteries to the segmental level. Acute occlusive pulmonary emboli involving the right middle lobe and left lower lobe lateral basal segment. More wall adherent near occlusive pulmonary emboli in the bilateral interlobar and lower lobe segmental pulmonary arteries. Mild right heart enlargement with elevated RV/LV ratio of 1.3. No pericardial effusion. Reflux of contrast into the IVC. Mediastinum/Nodes: No enlarged mediastinal, hilar, or axillary lymph nodes. Thyroid gland, trachea, and esophagus demonstrate no significant findings. Lungs/Pleura: Peripheral ground-glass density in the right middle lobe (series 6, image 51). Trace right pleural effusion. No pneumothorax. Mild subpleural cystic change and increased reticulation in both lungs, slightly progressed since 2018. Upper Abdomen: No acute abnormality. Musculoskeletal: No chest wall abnormality. No acute or significant osseous findings. Review of the MIP images confirms the above findings. IMPRESSION: 1. Acute on chronic occlusive and nonocclusive lobar and segmental pulmonary emboli involving the right middle lobe and both lower lobes. Positive for acute PE with CT evidence of right heart strain (RV/LV Ratio = 1.3) consistent with at least submassive (intermediate risk) PE. The presence of right heart strain has been associated with an increased risk of morbidity and mortality. 2. Peripheral ground-glass density  in the right middle lobe, concerning for pulmonary infarct. 3. Trace right pleural effusion. 4. Mild chronic interstitial lung disease, slightly progressed since 2018. Critical Value/emergent results were called by telephone at the time of interpretation on 01/10/2021 at 2:02  pm to provider [facility] , who verbally acknowledged these results. Electronically Signed   By: Titus Dubin M.D.   On: 01/10/2021 14:08   US Venous Img Lower Bilateral (DVT)  Result Date: 01/10/2021 CLINICAL DATA:  87 year old male with lower extremity edema EXAM: BILATERAL LOWER EXTREMITY VENOUS DOPPLER ULTRASOUND TECHNIQUE: Gray-scale sonography with graded compression, as well as color Doppler and duplex ultrasound were performed to evaluate the lower extremity deep venous systems from the level of the common femoral vein and including the common femoral, femoral, profunda femoral, popliteal and calf veins including the posterior tibial, peroneal and gastrocnemius veins when visible. The superficial great saphenous vein was also interrogated. Spectral Doppler was utilized to evaluate flow at rest and with distal augmentation maneuvers in the common femoral, femoral and popliteal veins. COMPARISON:  None. FINDINGS: RIGHT LOWER EXTREMITY Common Femoral Vein: The common femoral vein is only partially compressible. Wall adherent thrombus is visualized on color Doppler imaging. Positive flow present on color Doppler imaging. Saphenofemoral Junction: No evidence of thrombus. Normal compressibility and flow on color Doppler imaging. Profunda Femoral Vein: No evidence of thrombus. Normal compressibility and flow on color Doppler imaging. Femoral Vein: No evidence of thrombus. Normal compressibility, respiratory phasicity and response to augmentation. Popliteal Vein: No evidence of thrombus. Normal compressibility, respiratory phasicity and response to augmentation. Calf Veins: No evidence of thrombus. Normal compressibility and flow on color Doppler imaging. Superficial Great Saphenous Vein: No evidence of thrombus. Normal compressibility. Venous Reflux:  None. Other Findings:  None. LEFT LOWER EXTREMITY Common Femoral Vein: No evidence of thrombus. Normal compressibility, respiratory phasicity and response to  augmentation. Saphenofemoral Junction: No evidence of thrombus. Normal compressibility and flow on color Doppler imaging. Profunda Femoral Vein: No evidence of thrombus. Normal compressibility and flow on color Doppler imaging. Femoral Vein: No evidence of thrombus. Normal compressibility, respiratory phasicity and response to augmentation. Popliteal Vein: Only partially compressible. There is some eccentric wall thickening versus wall adherent thrombus in the proximal aspect of the popliteal vein. However, distally the vessel becomes completely occluded. Calf Veins: Occlusive thrombus extends into the posterior tibial and peroneal veins. Superficial Great Saphenous Vein: No evidence of thrombus. Normal compressibility. Venous Reflux:  None. Other Findings:  None. IMPRESSION: 1. Positive for nonocclusive wall adherent DVT within the right common femoral vein. 2. Also positive for occlusive DVT in the left popliteal and calf veins. Electronically Signed   By: Jacqulynn Cadet M.D.   On: 01/10/2021 11:35   DG Chest Portable 1 View  Result Date: 01/10/2021 CLINICAL DATA:  right sided chest pain EXAM: PORTABLE CHEST 1 VIEW COMPARISON:  Same day CT. FINDINGS: The cardiomediastinal silhouette is unchanged in contour.Cardiac loop recorder. No pleural effusion. No pneumothorax. Hazy opacities of the RIGHT lateral lung. Visualized abdomen is unremarkable. Advanced degenerative changes of the bilateral shoulders. IMPRESSION: Hazy opacity of the RIGHT lateral lung, possibly reflecting pulmonary infarction given history of pulmonary embolism. Additional differential considerations include infection or aspiration. Electronically Signed   By: Valentino Saxon M.D.   On: 01/10/2021 14:15   ASSESSMENT AND PLAN:  JAKIM MAZZOLA is a 87 y.o. male with medical history significant for hypertension, hyperlipidemia, history of left lower extremity DVT after an international flight in 2017 from [place], completed 6  months of  anticoagulation with Xarelto, presents to the emergency department for chief concerns of worsening left lower extremity swelling that started on 01/09/2021.  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 Lowella Grip-- hemodynamically stable hands not recommending any visit vascular procedure. -- will keep patient NPO after midnight tonight have CT scan reviewed by Dr. Lucky Cowboy -- continue oxygen  Hyperlipidemia -- continue statins  sick sinus syndrome status post pacemaker placement in July 2022  patient recently has been placed on high doses of gabapentin per ENT Dr Pryor Ochoa according to the wife. Patient has been remaining sleepy and family would like to taper gabapentin off. Discussed with pharmacist will taper 200 mg every week. I've recommended wife to discuss with ENT as out pt also  Family communication :wife/son in the room Consults :vascular CODE STATUS: FULL DVT Prophylaxis :heparin gtt Level of care: Progressive Cardiac Status is: Inpatient  Remains inpatient appropriate because:Inpatient level of care appropriate due to severity of illness  Dispo: The patient is from: Home              Anticipated d/c is to: Home              Patient currently is not medically stable to d/c.   Difficult to place patient No        TOTAL TIME TAKING CARE OF THIS PATIENT: 30 minutes.  >50% time spent on counselling and coordination of care  Note: This dictation was prepared with Dragon dictation along with smaller phrase technology. Any transcriptional errors that result from this process are unintentional.  Fritzi Mandes M.D    [facility]   CC: Primary care physician; Jerrol Banana., MD Patient ID: Evelina Dun, male   DOB: 1927-02-23, 87 y.o.   MRN: [id number]

## 2021-01-12 ENCOUNTER — Ambulatory Visit (INDEPENDENT_AMBULATORY_CARE_PROVIDER_SITE_OTHER): Payer: PPO | Admitting: Vascular Surgery

## 2021-01-12 ENCOUNTER — Encounter (INDEPENDENT_AMBULATORY_CARE_PROVIDER_SITE_OTHER): Payer: PPO

## 2021-01-12 ENCOUNTER — Encounter
Admission: EM | Disposition: A | Discharge status: Home Health | Payer: Self-pay | Source: Home / Self Care | Attending: Internal Medicine

## 2021-01-12 ENCOUNTER — Other Ambulatory Visit (INDEPENDENT_AMBULATORY_CARE_PROVIDER_SITE_OTHER): Payer: Self-pay | Admitting: Vascular Surgery

## 2021-01-12 DIAGNOSIS — I2699 Other pulmonary embolism without acute cor pulmonale: Secondary | ICD-10-CM

## 2021-01-12 HISTORY — PX: PULMONARY THROMBECTOMY: CATH118295

## 2021-01-12 LAB — CBC
HCT: 34.7 % — ABNORMAL LOW (ref 39.0–52.0)
Hemoglobin: 11.9 g/dL — ABNORMAL LOW (ref 13.0–17.0)
MCH: 33.2 pg (ref 26.0–34.0)
MCHC: 34.3 g/dL (ref 30.0–36.0)
MCV: 96.9 fL (ref 80.0–100.0)
Platelets: 285 10*3/uL (ref 150–400)
RBC: 3.58 MIL/uL — ABNORMAL LOW (ref 4.22–5.81)
RDW: 13.8 % (ref 11.5–15.5)
WBC: 9.2 10*3/uL (ref 4.0–10.5)
nRBC: 0 % (ref 0.0–0.2)

## 2021-01-12 LAB — HEPARIN LEVEL (UNFRACTIONATED)
Heparin Unfractionated: 0.41 IU/mL (ref 0.30–0.70)
Heparin Unfractionated: 0.48 IU/mL (ref 0.30–0.70)

## 2021-01-12 SURGERY — PULMONARY THROMBECTOMY
Anesthesia: Moderate Sedation | Laterality: Bilateral

## 2021-01-12 MED ORDER — FENTANYL CITRATE (PF) 100 MCG/2ML IJ SOLN
INTRAMUSCULAR | Status: AC
  Filled 2021-01-12: qty 2

## 2021-01-12 MED ORDER — MIDAZOLAM HCL 2 MG/2ML IJ SOLN
INTRAMUSCULAR | Status: DC | PRN
  Administered 2021-01-12 (×2): 1 mg via INTRAVENOUS

## 2021-01-12 MED ORDER — IODIXANOL 320 MG/ML IV SOLN
INTRAVENOUS | Status: DC | PRN
  Administered 2021-01-12: 30 mL

## 2021-01-12 MED ORDER — MIDAZOLAM HCL 2 MG/2ML IJ SOLN
INTRAMUSCULAR | Status: AC
  Filled 2021-01-12: qty 2

## 2021-01-12 MED ORDER — CEFAZOLIN SODIUM-DEXTROSE 1-4 GM/50ML-% IV SOLN: 1.0000 g | Freq: Once | INTRAVENOUS | Status: DC

## 2021-01-12 MED ORDER — MIDAZOLAM HCL 2 MG/ML PO SYRP: 8.0000 mg | ORAL_SOLUTION | Freq: Once | ORAL | Status: DC | PRN

## 2021-01-12 MED ORDER — FAMOTIDINE 20 MG PO TABS: 40.0000 mg | ORAL_TABLET | Freq: Once | ORAL | Status: DC | PRN

## 2021-01-12 MED ORDER — HEPARIN SODIUM (PORCINE) 1000 UNIT/ML IJ SOLN
INTRAMUSCULAR | Status: AC
  Filled 2021-01-12: qty 1

## 2021-01-12 MED ORDER — HYDROMORPHONE HCL 1 MG/ML IJ SOLN: 1.0000 mg | Freq: Once | INTRAMUSCULAR | Status: DC | PRN

## 2021-01-12 MED ORDER — ONDANSETRON HCL 4 MG/2ML IJ SOLN: 4.0000 mg | Freq: Four times a day (QID) | INTRAMUSCULAR | Status: DC | PRN

## 2021-01-12 MED ORDER — METHYLPREDNISOLONE SODIUM SUCC 125 MG IJ SOLR: 125.0000 mg | Freq: Once | INTRAMUSCULAR | Status: DC | PRN

## 2021-01-12 MED ORDER — CEFAZOLIN SODIUM-DEXTROSE 1-4 GM/50ML-% IV SOLN
INTRAVENOUS | Status: AC
  Filled 2021-01-12: qty 50

## 2021-01-12 MED ORDER — SODIUM CHLORIDE 0.9 % IV SOLN: INTRAVENOUS | Status: DC

## 2021-01-12 MED ORDER — DIPHENHYDRAMINE HCL 50 MG/ML IJ SOLN: 50.0000 mg | Freq: Once | INTRAMUSCULAR | Status: DC | PRN

## 2021-01-12 MED ORDER — FENTANYL CITRATE (PF) 100 MCG/2ML IJ SOLN
INTRAMUSCULAR | Status: DC | PRN
  Administered 2021-01-12: 25 ug via INTRAVENOUS
  Administered 2021-01-12: 50 ug via INTRAVENOUS

## 2021-01-12 SURGICAL SUPPLY — 17 items
CANISTER PENUMBRA ENGINE (MISCELLANEOUS) ×1 IMPLANT
CATH ANGIO 5F PIGTAIL 100CM (CATHETERS) ×1 IMPLANT
CATH INDIGO SEP 8 (CATHETERS) ×1 IMPLANT
CATH INFINITI JR4 5F (CATHETERS) ×1 IMPLANT
CATH LIGHTNING 8 XTORQ 115 (CATHETERS) ×1 IMPLANT
COVER PROBE U/S 5X48 (MISCELLANEOUS) ×1 IMPLANT
DEVICE SAFEGUARD 24CM (GAUZE/BANDAGES/DRESSINGS) ×1 IMPLANT
DEVICE TORQUE .025-.038 (MISCELLANEOUS) ×1 IMPLANT
GLIDEWIRE ADV .035X180CM (WIRE) ×1 IMPLANT
KIT MICROPUNCTURE NIT STIFF (SHEATH) ×1 IMPLANT
NDL ENTRY 21GA 7CM ECHOTIP (NEEDLE) IMPLANT
NEEDLE ENTRY 21GA 7CM ECHOTIP (NEEDLE) ×2 IMPLANT
PACK ANGIOGRAPHY (CUSTOM PROCEDURE TRAY) ×2 IMPLANT
SHEATH 9FRX11 (SHEATH) ×1 IMPLANT
SYR MEDRAD MARK 7 150ML (SYRINGE) ×1 IMPLANT
TUBING CONTRAST HIGH PRESS 48 (TUBING) ×2 IMPLANT
WIRE MAGIC TORQUE 260C (WIRE) ×2 IMPLANT

## 2021-01-12 NOTE — Interval H&P Note (Signed)
History and Physical Interval Note:  01/12/2021 4:15 PM  Matthew Clayton  has presented today for surgery, with the diagnosis of PE.  The various methods of treatment have been discussed with the patient and family. After consideration of risks, benefits and other options for treatment, the patient has consented to  Procedure(s): PULMONARY THROMBECTOMY (Bilateral) as a surgical intervention.  The patient's history has been reviewed, patient examined, no change in status, stable for surgery.  I have reviewed the patient's chart and labs.  Questions were answered to the patient's satisfaction.     Leotis Pain

## 2021-01-12 NOTE — Telephone Encounter (Signed)
I spoke to person in the hospital the pt's wife handed him the phone he assured me that he is taking care of the pt and his wife's questions .

## 2021-01-12 NOTE — Progress Notes (Signed)
Patient back from pulmonary thrombectomy. PAD in place to right groin with no signs of bleeding or hematoma. There is a slight red splotchy rash to his right side that is not itching or bothering the patient at this time. He has good pedal pulses and no complaints of pain at this time. Vital signs are stable. Will continue to monitor.

## 2021-01-12 NOTE — Progress Notes (Signed)
[place] for heparin Indication: pulmonary embolus  No Known Allergies  Patient Measurements: Height: '5\' 5"'$  (165.1 cm) Weight: 67.1 kg (148 lb) IBW/kg (Calculated) : 61.5 Heparin Dosing Weight: 67.1 kg  Vital Signs: Temp: 98.6 F (37 C) (08/28 1812) Temp Source: Oral (08/28 1812) BP: 120/62 (08/28 1812) Pulse Rate: 77 (08/28 1812)  Labs: Recent Labs    01/10/21 1140 01/10/21 2254 01/11/21 0528 01/11/21 0922 01/11/21 1758 01/12/21 0238  HGB 13.3  --  12.3*  --   --  11.9*  HCT 38.6*  --  36.8*  --   --  34.7*  PLT 244  --  269  --   --  285  APTT 33  --   --   --   --   --   LABPROT 14.3  --   --   --   --   --   INR 1.1  --   --   --   --   --   HEPARINUNFRC  --    < >  --  0.31 0.24* 0.41  CREATININE 1.11  --  1.23  --   --   --   TROPONINIHS 11  --   --   --   --   --    < > = values in this interval not displayed.     Estimated Creatinine Clearance: 33.3 mL/min (by C-G formula based on SCr of 1.23 mg/dL).   Medical History: Past Medical History:  Diagnosis Date   DVT (deep venous thrombosis) (HCC)    left leg   Hyperlipidemia     Medications:  Medications Prior to Admission  Medication Sig Dispense Refill Last Dose   aspirin EC 81 MG tablet Take 81 mg by mouth daily.   01/10/2021   gabapentin (NEURONTIN) 100 MG capsule Take 100-600 mg by mouth 3 (three) times daily.   01/09/2021 at 2000   benzonatate (TESSALON) 100 MG capsule Take 1 capsule (100 mg total) by mouth 3 (three) times daily as needed for cough. 30 capsule 1 unknown at prn   simvastatin (ZOCOR) 40 MG tablet TAKE 1 TABLET (40 MG TOTAL) BY MOUTH DAILY. NEEDS APPT FOR FURTHER RX. (Patient not taking: No sig reported) 90 tablet 3 Not Taking   Scheduled:   benzonatate  100 mg Oral TID   feeding supplement  237 mL Oral Daily   gabapentin  400 mg Oral BID   simvastatin  40 mg Oral QHS   Infusions:   heparin 1,400 Units/hr (01/12/21 0305)   PRN:  acetaminophen **OR** acetaminophen, ondansetron **OR** ondansetron (ZOFRAN) IV Anti-infectives (From admission, onward)    None       Assessment: Matthew Clayton initially presenting with left leg swelling, then developing right sided chest pain on inspiration. Patient was previously taking aspirin at home, but stopped taking with last PTA dose 12/10/2020.  Goal of Therapy:  Heparin level 0.3-0.7 units/ml Monitor platelets by anticoagulation protocol: Yes  8/27 2254 HL 0.21, subtherapeutic @ 1100 units/hr 8/28 0922 HL 0.31, therapeutic @ 1250 units/hr  8/28 1758 HL 0.24, subtherapeutic  8/29 0238 HL 0.41, therapeutic    Plan:   Continue heparin infusion at 1400 units/hr Recheck HL in 8 hr to confirm  Daily CBC while on heparin  Renda Rolls, PharmD, [facility] 01/12/2021 4:22 AM

## 2021-01-12 NOTE — Progress Notes (Signed)
[place] [facility] [place] NAME: Matthew Clayton    MR#:  [id number]  DATE OF BIRTH:  Dec 05, 1926  SUBJECTIVE:  patient came in with left lower extremity swelling. Wife and son in the room. Patient has been having some intermittent cough. Denies any chest pain or shortness of breath.  patient ambulated earlier with nurse in the hallways. Sats transiently dropped to upper 80s. Recovered quickly. No chest pain. REVIEW OF SYSTEMS:   Review of Systems  Constitutional:  Negative for chills, fever and weight loss.  HENT:  Negative for ear discharge, ear pain and nosebleeds.   Eyes:  Negative for blurred vision, pain and discharge.  Respiratory:  Negative for sputum production, shortness of breath, wheezing and stridor.   Cardiovascular:  Negative for chest pain, palpitations, orthopnea and PND.  Gastrointestinal:  Negative for abdominal pain, diarrhea, nausea and vomiting.  Genitourinary:  Negative for frequency and urgency.  Musculoskeletal:  Negative for back pain and joint pain.  Neurological:  Negative for sensory change, speech change, focal weakness and weakness.  Psychiatric/Behavioral:  Negative for depression and hallucinations. The patient is not nervous/anxious.   Tolerating Diet:npo Tolerating PT: ambulatory  DRUG ALLERGIES:  No Known Allergies  VITALS:  Blood pressure 125/64, pulse (!) 52, temperature 98.3 F (36.8 C), resp. rate 16, height '5\' 5"'$  (1.651 m), weight 67.1 kg, SpO2 96 %.  PHYSICAL EXAMINATION:   Physical Exam  GENERAL:  87 y.o.-year-old patient lying in the bed with no acute distress.  LUNGS:decreasedbreath sounds bilaterally, no wheezing, rales, rhonchi. No use of accessory muscles of respiration.  CARDIOVASCULAR: S1, S2 normal. No murmurs, rubs, or gallops.  ABDOMEN: Soft, nontender, nondistended. Bowel sounds present. No organomegaly or mass.  EXTREMITIES: + edema b/l.   No calf tenderness NEUROLOGIC: Cranial nerves II  through XII are intact. No focal Motor or sensory deficits b/l.   PSYCHIATRIC:  patient is alert and oriented x 3.  SKIN: No obvious rash, lesion, or ulcer.   LABORATORY PANEL:  CBC Recent Labs  Lab 01/12/21 0238  WBC 9.2  HGB 11.9*  HCT 34.7*  PLT 285     Chemistries  Recent Labs  Lab 01/10/21 1140 01/11/21 0528  NA 138 136  K 4.1 3.7  CL 103 104  CO2 26 26  GLUCOSE 105* 126*  BUN 13 13  CREATININE 1.11 1.23  CALCIUM 8.9 8.7*  AST 19  --   ALT 14  --   ALKPHOS 66  --   BILITOT 1.0  --     Cardiac Enzymes No results for input(s): TROPONINI in the last 168 hours. RADIOLOGY:  ECHOCARDIOGRAM COMPLETE  Result Date: 01/11/2021    ECHOCARDIOGRAM REPORT   Patient Name:   Matthew Clayton Date of Exam: 01/11/2021 Medical Rec #:  [id number]     Height:       65.0 in Accession #:    [id number]    Weight:       148.0 lb Date of Birth:  [birth year]     BSA:          1.741 m Patient Age:    87 years      BP:           98/58 mmHg Patient Gender: M             HR:           76 bpm. Exam Location:  [facility] Procedure: 2D Echo Indications:  Pulmonary Embolus  History:         Patient has no prior history of Echocardiogram examinations.                  Risk Factors:Hypertension and Dyslipidemia.  Sonographer:     L Thornton-Maynard Referring Phys:  [id number] AMY N COX Diagnosing Phys: Nelva Bush MD IMPRESSIONS  1. Left ventricular ejection fraction, by estimation, is 55 to 60%. The left ventricle has normal function. The left ventricle has no regional wall motion abnormalities. There is mild left ventricular hypertrophy. Left ventricular diastolic parameters are consistent with Grade II diastolic dysfunction (pseudonormalization).  2. Right ventricular systolic function is normal. The right ventricular size is mildly enlarged. There is normal pulmonary artery systolic pressure.  3. The mitral valve is grossly normal. Mild mitral valve regurgitation. No evidence of mitral stenosis.  4. The aortic  valve has an indeterminant number of cusps. There is moderate calcification of the aortic valve. There is severe thickening of the aortic valve. Aortic valve regurgitation is not visualized. Mild aortic valve stenosis.  5. The inferior vena cava is dilated in size with >50% respiratory variability, suggesting right atrial pressure of 8 mmHg. FINDINGS  Left Ventricle: Left ventricular ejection fraction, by estimation, is 55 to 60%. The left ventricle has normal function. The left ventricle has no regional wall motion abnormalities. The left ventricular internal cavity size was normal in size. There is  mild left ventricular hypertrophy. Left ventricular diastolic parameters are consistent with Grade II diastolic dysfunction (pseudonormalization). Right Ventricle: The right ventricular size is mildly enlarged. No increase in right ventricular wall thickness. Right ventricular systolic function is normal. There is normal pulmonary artery systolic pressure. The tricuspid regurgitant velocity is 2.14  m/s, and with an assumed right atrial pressure of 8 mmHg, the estimated right ventricular systolic pressure is 123456 mmHg. Left Atrium: Left atrial size was normal in size. Right Atrium: Right atrial size was not well visualized. Pericardium: There is no evidence of pericardial effusion. Mitral Valve: The mitral valve is grossly normal. Mild mitral valve regurgitation. No evidence of mitral valve stenosis. Tricuspid Valve: The tricuspid valve is not well visualized. Tricuspid valve regurgitation is trivial. Aortic Valve: The aortic valve has an indeterminant number of cusps. There is moderate calcification of the aortic valve. There is severe thickening of the aortic valve. Aortic valve regurgitation is not visualized. Mild aortic stenosis is present. Aortic valve mean gradient measures 14.0 mmHg. Aortic valve peak gradient measures 27.9 mmHg. Aortic valve area, by VTI measures 1.61 cm. Pulmonic Valve: The pulmonic valve  was not well visualized. Pulmonic valve regurgitation is not visualized. No evidence of pulmonic stenosis. Aorta: The aortic root is normal in size and structure. Pulmonary Artery: The pulmonary artery is not well seen. Venous: The inferior vena cava is dilated in size with greater than 50% respiratory variability, suggesting right atrial pressure of 8 mmHg. IAS/Shunts: No atrial level shunt detected by color flow Doppler.  LEFT VENTRICLE PLAX 2D LVIDd:         3.92 cm  Diastology LVIDs:         2.61 cm  LV e' medial:    7.07 cm/s LV PW:         1.35 cm  LV E/e' medial:  8.6 LV IVS:        1.22 cm  LV e' lateral:   8.05 cm/s LVOT diam:     2.10 cm  LV E/e' lateral: 7.6 LV SV:  79 LV SV Index:   46 LVOT Area:     3.46 cm  RIGHT VENTRICLE RV Basal diam:  4.42 cm RV S prime:     11.00 cm/s TAPSE (M-mode): 2.1 cm LEFT ATRIUM             Index LA diam:        2.40 cm 1.38 cm/m LA Vol (A2C):   38.8 ml 22.29 ml/m LA Vol (A4C):   26.0 ml 14.94 ml/m LA Biplane Vol: 33.2 ml 19.07 ml/m  AORTIC VALVE                    PULMONIC VALVE AV Area (Vmax):    1.42 cm     PV Vmax:       0.88 m/s AV Area (Vmean):   1.49 cm     PV Peak grad:  3.1 mmHg AV Area (VTI):     1.61 cm AV Vmax:           264.00 cm/s AV Vmean:          176.000 cm/s AV VTI:            0.494 m AV Peak Grad:      27.9 mmHg AV Mean Grad:      14.0 mmHg LVOT Vmax:         108.00 cm/s LVOT Vmean:        75.900 cm/s LVOT VTI:          0.229 m LVOT/AV VTI ratio: 0.46  AORTA Ao Root diam: 3.40 cm MV E velocity: 60.80 cm/s  TRICUSPID VALVE MV A velocity: 55.70 cm/s  TR Peak grad:   18.3 mmHg MV E/A ratio:  1.09        TR Vmax:        214.00 cm/s                             SHUNTS                            Systemic VTI:  0.23 m                            Systemic Diam: 2.10 cm Nelva Bush MD Electronically signed by Nelva Bush MD Signature Date/Time: 01/11/2021/3:03:01 PM    Final    ASSESSMENT AND PLAN:  Matthew Clayton is a 87 y.o. male with  medical history significant for hypertension, hyperlipidemia, history of left lower extremity DVT after an international flight in 2017 from [place], completed 6 months of anticoagulation with Xarelto, presents to the emergency department for chief concerns of worsening left lower extremity swelling that started on 01/09/2021.  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 Mondy-hemodynamically stable hands not recommending any visit vascular procedure. -- will keep patient NPO after midnight tonight have CT scan reviewed by Dr. Lucky Cowboy -- continue oxygen --8/29-- patient evaluated by Dr. Lucky Cowboy. Plans for Thrombectomy  today  Hyperlipidemia -- continue statins  sick sinus syndrome status post pacemaker placement in July 2022  Cough --patient recently has been placed on high doses of gabapentin per ENT Dr Pryor Ochoa according to the wife. Patient has been remaining sleepy and family  would like to taper gabapentin off. Discussed with pharmacist will taper 200 mg every week. I've recommended wife to discuss with ENT as out pt also  Family communication :wife/son in the room Consults :vascular CODE STATUS: FULL DVT Prophylaxis :heparin gtt Level of care: Progressive Cardiac Status is: Inpatient  Remains inpatient appropriate because:Inpatient level of care appropriate due to severity of illness  Dispo: The patient is from: Home              Anticipated d/c is to: Home              Patient currently is not medically stable to d/c.   Difficult to place patient No        TOTAL TIME TAKING CARE OF THIS PATIENT: 30 minutes.  >50% time spent on counselling and coordination of care  Note: This dictation was prepared with Dragon dictation along with smaller phrase technology. Any transcriptional errors that result from this process are  unintentional.  Fritzi Mandes M.D    [facility]   CC: Primary care physician; Jerrol Banana., MD Patient ID: Matthew Clayton, male   DOB: [birth year], 87 y.o.   MRN: [id number]

## 2021-01-12 NOTE — Telephone Encounter (Signed)
Patient had an appointment set up today to be seen by Dr. Delana Meyer.   Patient had to cancel due to being hospitalized.  The family believes patient my need to have surgery and have some questions to ask Dr. Delana Meyer.  Please advise.

## 2021-01-12 NOTE — Op Note (Signed)
[facility]  Percutaneous Study/Intervention Procedural Note   Date of Surgery: 01/12/2021,5:30 PM  Surgeon: Leotis Pain  Pre-operative Diagnosis: Symptomatic bilateral pulmonary emboli  Post-operative diagnosis:  Same  Procedure(s) Performed:  1.  Mechanical thrombectomy left lower lobe pulmonary artery and right middle and lower lobe pulmonary arteries using the penumbra CAT 8 catheter  2.  Selective catheter placement right lower and middle lobe pulmonary artery  3.  Selective catheter placement left lower lobe pulmonary    Anesthesia: Conscious sedation was administered under my direct supervision by the interventional radiology RN. IV Versed plus fentanyl were utilized. Continuous ECG, pulse oximetry and blood pressure was monitored throughout the entire procedure.  Versed and fentanyl were administered intravenously.  Conscious sedation was administered for a total of 20 minutes using 2 of Versed and 75 mcg of Fentanyl.  EBL: 250 cc  Sheath: 9 French right femoral vein  Contrast: 30 cc   Fluoroscopy Time: 8.1 minutes  Indications:  Patient presents with pulmonary emboli. The patient is symptomatic with hypoxemia and dyspnea on exertion.  There is evidence of right heart strain on the CT angiogram. The patient is otherwise a good candidate for intervention and even the long-term benefits pulmonary angiography with thrombolysis is offered. The risks and benefits are reviewed long-term benefits are discussed. All questions are answered patient agrees to proceed.  Procedure:  Matthew Clayton a 87 y.o. male who was identified and appropriate procedural time out was performed.  The patient was then placed supine on the table and prepped and draped in the usual sterile fashion.  Ultrasound was used to evaluate the right common femoral vein.   A digital ultrasound image was acquired for the permanent record.  A Seldinger needle was used to access the right common  femoral vein under direct ultrasound guidance.  A 0.035 J wire was advanced without resistance and a 5Fr sheath was placed and then upsized to an 9 [place] sheath.    The wire and pigtail catheter were then negotiated into the right atrium and then into the main pulmonary artery where selective imaging was performed with the pigtail catheter before exchanging for the penumbra CAT 8 catheter.  This was advanced lower lobe and selective imaging showed a small to medium amount of thrombus in the left lower lobe pulmonary artery.  Was then advanced into the right lower lobe and right middle lobe where selective imaging was performed in these vessels showing small to medium amount of thrombus.  The previous CT scan did not show significant thrombus burden in the upper lobe pulmonary artery so these were not selectively imaged.  The Penumbra Cat 8 catheter was then advanced up into the pulmonary vasculature. The right lung was addressed first. Catheter was negotiated into the right lower lobe pulmonary artery and mechanical thrombectomy was performed using the catheter and the separator. Follow-up imaging demonstrated a good result and therefore the catheter was renegotiated into the right middle lobe pulmonary artery and again mechanical thrombectomy was performed. Passes were made with both the Penumbra catheter itself as well as introducing the separator. Follow-up imaging was then performed.  Only a small amount of residual thrombus was seen in the right lower lobe pulmonary artery and so I elected to turn my attention to the left.  The Penumbra Cat 8 catheter was then negotiated to the opposite side. The left lung was then addressed. Catheter was negotiated into the left lower lobe pulmonary artery and mechanical thrombectomy was performed  again using the separator. Follow-up imaging demonstrated a good result with only a small amount of residual thrombus seen in the left lower lobe pulmonary artery  After  review these images wires were reintroduced and the catheters removed. Then, the sheath is then pulled and pressures held. A safeguard is placed.    Findings:     Right lung: Small to medium amount of thrombus in the right lower and middle lobe pulmonary arteries  Left lung: Small to medium amount of thrombus in the left lower lobe pulmonary    Disposition: Patient was taken to the recovery room in stable condition having tolerated the procedure well.  Matthew Clayton 01/12/2021,5:30 PM

## 2021-01-12 NOTE — Progress Notes (Signed)
ANTICOAGULATION CONSULT NOTE   Pharmacy Consult for heparin Indication: pulmonary embolus  No Known Allergies  Patient Measurements: Height: '5\' 5"'$  (165.1 cm) Weight: 67.1 kg (148 lb) IBW/kg (Calculated) : 61.5 Heparin Dosing Weight: 67.1 kg  Vital Signs: Temp: 98.1 F (36.7 C) (08/29 0841) BP: 107/66 (08/29 0841) Pulse Rate: 71 (08/29 0841)  Labs: Recent Labs    01/10/21 1140 01/10/21 2254 01/11/21 0528 01/11/21 0922 01/11/21 1758 01/12/21 0238 01/12/21 1055  HGB 13.3  --  12.3*  --   --  11.9*  --   HCT 38.6*  --  36.8*  --   --  34.7*  --   PLT 244  --  269  --   --  285  --   APTT 33  --   --   --   --   --   --   LABPROT 14.3  --   --   --   --   --   --   INR 1.1  --   --   --   --   --   --   HEPARINUNFRC  --    < >  --    < > 0.24* 0.41 0.48  CREATININE 1.11  --  1.23  --   --   --   --   TROPONINIHS 11  --   --   --   --   --   --    < > = values in this interval not displayed.     Estimated Creatinine Clearance: 33.3 mL/min (by C-G formula based on SCr of 1.23 mg/dL).   Medical History: Past Medical History:  Diagnosis Date   DVT (deep venous thrombosis) (HCC)    left leg   Hyperlipidemia     Medications:  Medications Prior to Admission  Medication Sig Dispense Refill Last Dose   aspirin EC 81 MG tablet Take 81 mg by mouth daily.   01/10/2021   gabapentin (NEURONTIN) 100 MG capsule Take 100-600 mg by mouth 3 (three) times daily.   01/09/2021 at 2000   benzonatate (TESSALON) 100 MG capsule Take 1 capsule (100 mg total) by mouth 3 (three) times daily as needed for cough. 30 capsule 1 unknown at prn   simvastatin (ZOCOR) 40 MG tablet TAKE 1 TABLET (40 MG TOTAL) BY MOUTH DAILY. NEEDS APPT FOR FURTHER RX. (Patient not taking: No sig reported) 90 tablet 3 Not Taking   Scheduled:   benzonatate  100 mg Oral TID   feeding supplement  237 mL Oral Daily   gabapentin  400 mg Oral BID   simvastatin  40 mg Oral QHS   Infusions:   heparin 1,400 Units/hr  (01/12/21 0305)   PRN: acetaminophen **OR** acetaminophen, ondansetron **OR** ondansetron (ZOFRAN) IV Anti-infectives (From admission, onward)    None       Assessment: 89YOM initially presenting with left leg swelling, then developing right sided chest pain on inspiration. Patient was previously taking aspirin at home, but stopped taking with last PTA dose 12/10/2020.  Goal of Therapy:  Heparin level 0.3-0.7 units/ml Monitor platelets by anticoagulation protocol: Yes  8/27 2254 HL 0.21, subtherapeutic @ 1100 units/hr 8/28 0922 HL 0.31, therapeutic @ 1250 units/hr  8/28 1758 HL 0.24, subtherapeutic  8/29 0238 HL 0.41, therapeutic 8/29 1055 HL 0.48  therapeutic x2    Plan:   8/29 1055 HL 0.48  therapeutic x2 Continue heparin infusion at 1400 units/hr F/u HL with am labs Daily CBC  while on heparin  Chinita Greenland PharmD Clinical Pharmacist 01/12/2021

## 2021-01-13 ENCOUNTER — Inpatient Hospital Stay: Payer: PPO

## 2021-01-13 ENCOUNTER — Encounter: Payer: Self-pay | Admitting: Vascular Surgery

## 2021-01-13 LAB — CBC
HCT: 32.1 % — ABNORMAL LOW (ref 39.0–52.0)
Hemoglobin: 11 g/dL — ABNORMAL LOW (ref 13.0–17.0)
MCH: 33.4 pg (ref 26.0–34.0)
MCHC: 34.3 g/dL (ref 30.0–36.0)
MCV: 97.6 fL (ref 80.0–100.0)
Platelets: 307 10*3/uL (ref 150–400)
RBC: 3.29 MIL/uL — ABNORMAL LOW (ref 4.22–5.81)
RDW: 13.8 % (ref 11.5–15.5)
WBC: 7.6 10*3/uL (ref 4.0–10.5)
nRBC: 0 % (ref 0.0–0.2)

## 2021-01-13 LAB — BASIC METABOLIC PANEL
Anion gap: 7 (ref 5–15)
BUN: 17 mg/dL (ref 8–23)
CO2: 25 mmol/L (ref 22–32)
Calcium: 8.4 mg/dL — ABNORMAL LOW (ref 8.9–10.3)
Chloride: 104 mmol/L (ref 98–111)
Creatinine, Ser: 1.16 mg/dL (ref 0.61–1.24)
GFR, Estimated: 59 mL/min — ABNORMAL LOW (ref >60)
Glucose, Bld: 115 mg/dL — ABNORMAL HIGH (ref 70–99)
Potassium: 3.8 mmol/L (ref 3.5–5.1)
Sodium: 136 mmol/L (ref 135–145)

## 2021-01-13 LAB — URIC ACID: Uric Acid, Serum: 4.9 mg/dL (ref 3.7–8.6)

## 2021-01-13 LAB — HEPARIN LEVEL (UNFRACTIONATED): Heparin Unfractionated: 0.2 IU/mL — ABNORMAL LOW (ref 0.30–0.70)

## 2021-01-13 LAB — MAGNESIUM: Magnesium: 2.3 mg/dL (ref 1.7–2.4)

## 2021-01-13 MED ORDER — GABAPENTIN 100 MG PO CAPS: 400.0000 mg | ORAL_CAPSULE | Freq: Two times a day (BID) | ORAL | 0 refills | Status: DC

## 2021-01-13 MED ORDER — APIXABAN 5 MG PO TABS: 5.0000 mg | ORAL_TABLET | Freq: Two times a day (BID) | ORAL | Status: DC

## 2021-01-13 MED ORDER — DICLOFENAC SODIUM 1 % EX GEL
2.0000 g | Freq: Two times a day (BID) | CUTANEOUS | Status: DC
  Administered 2021-01-13: 2 g via TOPICAL
  Filled 2021-01-13: qty 100

## 2021-01-13 MED ORDER — HEPARIN BOLUS VIA INFUSION
1000.0000 [IU] | Freq: Once | INTRAVENOUS | Status: AC
  Administered 2021-01-13: 1000 [IU] via INTRAVENOUS
  Filled 2021-01-13: qty 1000

## 2021-01-13 MED ORDER — APIXABAN 5 MG PO TABS
10.0000 mg | ORAL_TABLET | Freq: Two times a day (BID) | ORAL | Status: DC
Start: 2021-01-13 — End: 2021-01-13
  Administered 2021-01-13: 10 mg via ORAL
  Filled 2021-01-13: qty 2

## 2021-01-13 MED ORDER — DICLOFENAC SODIUM 1 % EX GEL
2.0000 g | Freq: Two times a day (BID) | CUTANEOUS | 0 refills | Status: DC
Start: 2021-01-13 — End: 2021-03-24

## 2021-01-13 MED ORDER — APIXABAN 5 MG PO TABS: 10.0000 mg | ORAL_TABLET | Freq: Two times a day (BID) | ORAL | 3 refills | Status: DC

## 2021-01-13 NOTE — Discharge Instructions (Signed)
Information on my medicine - ELIQUIS (apixaban) This medication education was reviewed with me or my healthcare representative as part of my discharge preparation. The pharmacist that spoke with me during my hospital stay was:  ____________________________ (pharmacist name)WHY WAS [place]? Eliquis was prescribed to treat blood clots that may have been found in the veins of your legs (deep vein thrombosis) or in your lungs (pulmonary embolism) and to reduce the risk of them occurring again.WHAT DO YOU NEED TO KNOW ABOUT ELIQUIS ? The starting dose is 10 mg (two 5 mg tablets) taken TWICE daily for the FIRST SEVEN (7) DAYS,then on (enter date)______________the dose is reduced to ONE 5 mg tablet taken TWICE daily. Eliquis may be taken with or without food.Try to take the dose about the same time in the morning and in the evening. If you have difficulty swallowing the tablet whole please discuss with your pharmacist how to take the medication safely.Take Eliquis exactly as prescribed and DO NOT stop taking Eliquis without talking to the doctor who prescribed the medication. Stopping may increase your risk of developing a new blood clot. Refill your prescription before you run out. After discharge, you should have regular check-up appointments with your healthcare provider that is prescribing your Eliquis. WHAT DO YOU DO IF YOU MISS A DOSE?If a dose of ELIQUIS is not taken at the scheduled time, take it as soon as possible on the same day and twice-daily administration should be resumed. The dose should not be doubled to make up for a missed dose.IMPORTANT SAFETY INFORMATIONA possible side effect of Eliquis is bleeding. You should call your healthcare provider right away if you experience any of the following: ?   Bleeding from an injury or your nose that does not stop.?   Unusual colored urine (red or dark brown) or unusual colored stools (red or black).?   Unusual bruising for unknown  reasons.?   A serious fall or if you hit your head (even if there is no bleeding).Some medicines may interact with Eliquis and might increase your risk of bleeding or clotting while on Eliquis. To help avoid this, consult your healthcare provider or pharmacist prior to using any new prescription or non-prescription medications, including herbals, vitamins, non-steroidal anti-inflammatory drugs (NSAIDs) and supplements. This website has more information on Eliquis (apixaban): [email or web address].

## 2021-01-13 NOTE — Progress Notes (Signed)
ANTICOAGULATION CONSULT NOTE   Pharmacy Consult for heparin Indication: pulmonary embolus  No Known Allergies  Patient Measurements: Height: '5\' 5"'$  (165.1 cm) Weight: 67.1 kg (148 lb) IBW/kg (Calculated) : 61.5 Heparin Dosing Weight: 67.1 kg  Vital Signs: Temp: 98.4 F (36.9 C) (08/30 0405) Temp Source: Oral (08/30 0405) BP: 107/61 (08/30 0405) Pulse Rate: 78 (08/30 0405)  Labs: Recent Labs    01/10/21 1140 01/10/21 2254 01/11/21 0528 01/11/21 0922 01/12/21 0238 01/12/21 1055 01/13/21 0439  HGB 13.3  --  12.3*  --  11.9*  --  11.0*  HCT 38.6*  --  36.8*  --  34.7*  --  32.1*  PLT 244  --  269  --  285  --  307  APTT 33  --   --   --   --   --   --   LABPROT 14.3  --   --   --   --   --   --   INR 1.1  --   --   --   --   --   --   HEPARINUNFRC  --    < >  --    < > 0.41 0.48 0.20*  CREATININE 1.11  --  1.23  --   --   --  1.16  TROPONINIHS 11  --   --   --   --   --   --    < > = values in this interval not displayed.     Estimated Creatinine Clearance: 35.3 mL/min (by C-G formula based on SCr of 1.16 mg/dL).   Medical History: Past Medical History:  Diagnosis Date   DVT (deep venous thrombosis) (HCC)    left leg   Hyperlipidemia     Medications:  Medications Prior to Admission  Medication Sig Dispense Refill Last Dose   aspirin EC 81 MG tablet Take 81 mg by mouth daily.   01/10/2021   gabapentin (NEURONTIN) 100 MG capsule Take 100-600 mg by mouth 3 (three) times daily.   01/09/2021 at 2000   benzonatate (TESSALON) 100 MG capsule Take 1 capsule (100 mg total) by mouth 3 (three) times daily as needed for cough. 30 capsule 1 unknown at prn   simvastatin (ZOCOR) 40 MG tablet TAKE 1 TABLET (40 MG TOTAL) BY MOUTH DAILY. NEEDS APPT FOR FURTHER RX. (Patient not taking: No sig reported) 90 tablet 3 Not Taking   Scheduled:   benzonatate  100 mg Oral TID   feeding supplement  237 mL Oral Daily   gabapentin  400 mg Oral BID   heparin  1,000 Units Intravenous Once    simvastatin  40 mg Oral QHS   Infusions:   heparin 1,400 Units/hr (01/12/21 2228)   PRN: acetaminophen **OR** acetaminophen, HYDROmorphone (DILAUDID) injection, ondansetron **OR** ondansetron (ZOFRAN) IV, ondansetron (ZOFRAN) IV Anti-infectives (From admission, onward)    Start     Dose/Rate Route Frequency Ordered Stop   01/13/21 0000  ceFAZolin (ANCEF) IVPB 1 g/50 mL premix  Status:  Discontinued       Note to Pharmacy: To be given in specials   1 g 100 mL/hr over 30 Minutes Intravenous  Once 01/12/21 1423 01/12/21 1830       Assessment: 89YOM initially presenting with left leg swelling, then developing right sided chest pain on inspiration. Patient was previously taking aspirin at home, but stopped taking with last PTA dose 12/10/2020.  Goal of Therapy:  Heparin level 0.3-0.7 units/ml Monitor platelets  by anticoagulation protocol: Yes  8/27 2254 HL 0.21, subtherapeutic @ 1100 units/hr 8/28 0922 HL 0.31, therapeutic @ 1250 units/hr  8/28 1758 HL 0.24, subtherapeutic  8/29 0238 HL 0.41, therapeutic 8/29 1055 HL 0.48  therapeutic x2 8/30 0439 HL 0.20  subtherapeutic    Plan:   8/30:  HL @ 0439 = 0.20 Will order Heparin 1000 units IV X 1 bolus and increase drip rate to 1550 units/hr. Will recheck HL 8 hrs after rate change.   [place] Pharmacist 01/13/2021

## 2021-01-13 NOTE — Evaluation (Signed)
Physical Therapy Evaluation Patient Details Name: Matthew Clayton MRN: [id number] DOB: 05/02/1927 Today's Date: 01/13/2021   History of Present Illness  87 y.o. male with medical history significant for hypertension, hyperlipidemia, history of left lower extremity DVT after an international flight in 2017.   Had pacemaker placed 7/26, apparently did not restart his asprin after that, presented to emergency department for chief concerns of worsening left lower extremity swelling that started on 8/26 - found to have PEs and DVTs - s/p thrombectomy 8/29.  Clinical Impression  Pt did well with PT exam and overall showed good mobility and safety with prolonged bout of ambulation (~350 ft), O2 remains in the 90s t/o the effort on room air.  He does not normally require AD, but c/o mild L knee pain and PT recommends continuing using walker when he initially goes home, would benefit from HHPT to insure appropriate transition and continued usage.      Follow Up Recommendations Home health PT    Equipment Recommendations  None recommended by PT    Recommendations for Other Services       Precautions / Restrictions Precautions Precautions: Fall Restrictions Weight Bearing Restrictions: No      Mobility  Bed Mobility Overal bed mobility: Independent             General bed mobility comments: easily transitions to/from sitting w/o assist    Transfers Overall transfer level: Independent Equipment used: Rolling walker (2 wheeled)             General transfer comment: Pt able to rise and stabilize w/o the walker, no assist needed  Ambulation/Gait Ambulation/Gait assistance: Modified independent (Device/Increase time) Gait Distance (Feet): 250 Feet Assistive device: Rolling walker (2 wheeled)       General Gait Details: Pt with some initial hesitancy with L WBing, but with cues for appropriate walker use was able to maintain consistent cadence and generally did quite well.  He  was on room air t/o the effort with sats 92-93% and very little fatigue.  Stairs            Wheelchair Mobility    Modified Rankin (Stroke Patients Only)       Balance Overall balance assessment: Modified Independent                                           Pertinent Vitals/Pain Pain Assessment: No/denies pain    Home Living Family/patient expects to be discharged to:: Private residence Living Arrangements: Spouse/significant other             Home Equipment: Environmental consultant - 2 wheels      Prior Function Level of Independence: Independent         Comments: apparently rarely evern needs AD and is able to run errands, be active and independent     Hand Dominance        Extremity/Trunk Assessment   Upper Extremity Assessment Upper Extremity Assessment: Overall WFL for tasks assessed    Lower Extremity Assessment Lower Extremity Assessment: Overall WFL for tasks assessed (L knee pain/swelling, but functional t/o)       Communication      Cognition Arousal/Alertness: Awake/alert Behavior During Therapy: WFL for tasks assessed/performed Overall Cognitive Status: Within Functional Limits for tasks assessed  General Comments General comments (skin integrity, edema, etc.): Pt with some hesitancy due to L knee swelling (and OA) but no safety issues and good overall confidence with mobility    Exercises     Assessment/Plan    PT Assessment Patient needs continued PT services  PT Problem List Decreased strength;Decreased activity tolerance;Decreased balance;Decreased range of motion;Decreased mobility;Decreased knowledge of use of DME;Decreased safety awareness       PT Treatment Interventions DME instruction;Gait training;Stair training;Functional mobility training;Therapeutic activities;Therapeutic exercise;Balance training;Patient/family education    PT Goals (Current goals can  be found in the Care Plan section)  Acute Rehab PT Goals Patient Stated Goal: Go home PT Goal Formulation: With patient Time For Goal Achievement: 01/27/21 Potential to Achieve Goals: Good    Frequency Min 2X/week   Barriers to discharge        Co-evaluation               AM-PAC PT "6 Clicks" Mobility  Outcome Measure Help needed turning from your back to your side while in a flat bed without using bedrails?: None Help needed moving from lying on your back to sitting on the side of a flat bed without using bedrails?: None Help needed moving to and from a bed to a chair (including a wheelchair)?: None Help needed standing up from a chair using your arms (e.g., wheelchair or bedside chair)?: None Help needed to walk in hospital room?: A Little Help needed climbing 3-5 steps with a railing? : A Little 6 Click Score: 22    End of Session Equipment Utilized During Treatment: Gait belt Activity Tolerance: Patient tolerated treatment well Patient left: with bed alarm set;with call bell/phone within reach Nurse Communication: Mobility status PT Visit Diagnosis: Difficulty in walking, not elsewhere classified (R26.2);Muscle weakness (generalized) (M62.81)    Time: [id number] PT Time Calculation (min) (ACUTE ONLY): 19 min   Charges:   PT Evaluation $PT Eval Low Complexity: 1 Low PT Treatments $Gait Training: 8-22 mins        Kreg Shropshire, DPT 01/13/2021, 3:20 PM

## 2021-01-13 NOTE — Progress Notes (Signed)
SATURATION QUALIFICATIONS: (This note is used to comply with regulatory documentation for home oxygen)  Patient Saturations on Room Air at Rest = 94%  Patient Saturations on Room Air while Ambulating = 90%  Patient Saturations on 0 Liters of oxygen while Ambulating = 90%  Please briefly explain why patient needs home oxygen: does not qualify for home 02

## 2021-01-13 NOTE — Discharge Summary (Signed)
Matthew Clayton NAME: Matthew Clayton    MR#:  [id number]  DATE OF BIRTH:  [birth year]  DATE OF ADMISSION:  01/10/2021 ADMITTING PHYSICIAN: Matthew N Cox, DO  DATE OF DISCHARGE: 01/13/2021  PRIMARY CARE PHYSICIAN: Matthew Clayton., MD    ADMISSION DIAGNOSIS:  Pulmonary embolism (HCC) [I26.99] Acute deep vein thrombosis (DVT) of other specified vein of both lower extremities (HCC) [I82.493] Swelling of lower leg [M79.89] Acute pulmonary embolism, unspecified pulmonary embolism type, unspecified whether acute cor pulmonale present (Matthew Clayton) [I26.99]  DISCHARGE DIAGNOSIS:  acute DVT both lower extremities--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 male with medical history significant for hypertension, hyperlipidemia, history of left lower extremity DVT after an international flight in 2017 from [place], completed 6 months of anticoagulation with Xarelto, presents to the emergency department for chief concerns of worsening left lower extremity swelling that started on 01/09/2021.   Acute recurrent bilateral lower extremity DVT and acute  bilateral PE Right heart strain/right lower pulmonary infarct -- currently on IV heparin drip -- patient was seen by vascular surgery Dr. Mondy-hemodynamically stable hands not recommending any visit vascular procedure. -- will keep patient NPO after midnight tonight have CT scan reviewed by Matthew Clayton -- continue oxygen --8/29-- patient evaluated by Matthew Clayton. Plans for Thrombectomy  today --8/30--s/p Thrombectomy-- okay to transition to oral eliquis per Matthew Clayton -- patient ambulated with RN and PT. Oxygen sats remain more than 90%. -- Patient has seen Matthew Clayton in the past with previous  episode of DVT. I have discussed with Matthew Clayton and patient will follow-up at the cancer center in few weeks.  Left knee swelling with pain -- patient able to ambulate with physical therapy.  -- As needed Voltaren gel locally --Home health PT is recommended however per TOC due to is insurance home health is not available. -- Family has been made aware by [facility]   Hyperlipidemia -- continue statins   sick sinus syndrome status post pacemaker placement in July 2022   Cough --patient recently has been placed on high doses of gabapentin per ENT Dr Matthew Clayton according to the wife. Patient has been remaining sleepy and family would like to taper gabapentin off. Discussed with pharmacist will taper 200 mg every week. I've recommended wife to discuss with ENT as out pt also   Family communication :wife/daughter in the room Consults :vascular CODE STATUS: FULL DVT Prophylaxis :heparin gtt Level of care: Progressive Cardiac Status is: Inpatient   Remains inpatient appropriate because:Inpatient level of care appropriate due to severity of illness   Dispo: The patient is from: Home              Anticipated d/c is to: Home today              Patient currently is medically optimized for discharge             difficult to place patient No   discuss with patient at length. Daughter and wife were present in the room. List of questions for daughter answered.    CONSULTS OBTAINED:  Treatment Team:  Matthew Huxley, MD  DRUG ALLERGIES:  No Known Allergies  DISCHARGE MEDICATIONS:   Allergies  as of 01/13/2021   No Known Allergies      Medication List     STOP taking these medications    aspirin EC 81 MG tablet       TAKE these medications    apixaban 5 MG Tabs tablet Commonly known as: ELIQUIS Take 2 tablets (10 mg total) by mouth 2 (two) times daily. And from 01/20/2021 take 5 mg Twice a day   benzonatate 100 MG capsule Commonly known as: TESSALON Take 1 capsule (100 mg total) by  mouth 3 (three) times daily as needed for cough.   diclofenac Sodium 1 % Gel Commonly known as: VOLTAREN Apply 2 g topically 2 (two) times daily.   gabapentin 100 MG capsule Commonly known as: NEURONTIN Take 4 capsules (400 mg total) by mouth 2 (two) times daily. Then taper down by 200 mg on every Sunday (weekly) and stop What changed:  how much to take when to take this additional instructions   simvastatin 40 MG tablet Commonly known as: ZOCOR TAKE 1 TABLET (40 MG TOTAL) BY MOUTH DAILY. NEEDS APPT FOR FURTHER RX.        If you experience worsening of your admission symptoms, develop shortness of breath, life threatening emergency, suicidal or homicidal thoughts you must seek medical attention immediately by calling 911 or calling your MD immediately  if symptoms less severe.  You Must read complete instructions/literature along with all the possible adverse reactions/side effects for all the Medicines you take and that have been prescribed to you. Take any new Medicines after you have completely understood and accept all the possible adverse reactions/side effects.   Please note  You were cared for by a hospitalist during your hospital stay. If you have any questions about your discharge medications or the care you received while you were in the hospital after you are discharged, you can call the unit and asked to speak with the hospitalist on call if the hospitalist that took care of you is not available. Once you are discharged, your primary care physician will handle any further medical issues. Please note that NO REFILLS for any discharge medications will be authorized once you are discharged, as it is imperative that you return to your primary care physician (or establish a relationship with a primary care physician if you do not have one) for your aftercare needs so that they can reassess your need for medications and monitor your lab values. Today   SUBJECTIVE   Left knee  pain with mild swelling No sob or chest pain Has intermittent chronic cough  VITAL SIGNS:  Blood pressure 99/68, pulse (!) 56, temperature 98.3 F (36.8 C), resp. rate 18, height '5\' 5"'$  (1.651 m), weight 67.1 kg, SpO2 93 %.  I/O:   Intake/Output Summary (Last 24 hours) at 01/13/2021 1546 Last data filed at 01/13/2021 1400 Gross per 24 hour  Intake 722.24 ml  Output 850 ml  Net -127.76 ml    PHYSICAL EXAMINATION:  GENERAL:  87 y.o.-year-old patient lying in the bed with no acute distress.  LUNGS: Normal breath sounds bilaterally, no wheezing, rales,rhonchi or crepitation. No use of accessory muscles of respiration.  CARDIOVASCULAR: S1, S2 normal. No murmurs, rubs, or gallops.  ABDOMEN: Soft, non-tender, non-distended. Bowel sounds present. No organomegaly or mass.  EXTREMITIES: left knee DJD+ with swelling NEUROLOGIC: non focal  PSYCHIATRIC: The patient is alert and oriented x 3.  SKIN: No obvious rash, lesion, or ulcer.   DATA REVIEW:   CBC  Recent  Labs  Lab 01/13/21 0439  WBC 7.6  HGB 11.0*  HCT 32.1*  PLT 307    Chemistries  Recent Labs  Lab 01/10/21 1140 01/11/21 0528 01/13/21 0439  NA 138   < > 136  K 4.1   < > 3.8  CL 103   < > 104  CO2 26   < > 25  GLUCOSE 105*   < > 115*  BUN 13   < > 17  CREATININE 1.11   < > 1.16  CALCIUM 8.9   < > 8.4*  MG  --   --  2.3  AST 19  --   --   ALT 14  --   --   ALKPHOS 66  --   --   BILITOT 1.0  --   --    < > = values in this interval not displayed.    Microbiology Results   Recent Results (from the past 240 hour(s))  Resp Panel by RT-PCR (Flu A&B, Covid) Nasopharyngeal Swab     Status: None   Collection Time: 01/10/21  7:30 PM   Specimen: Nasopharyngeal Swab; Nasopharyngeal(NP) swabs in vial transport medium  Result Value Ref Range Status   SARS Coronavirus 2 by RT PCR NEGATIVE NEGATIVE Final    Comment: (NOTE) SARS-CoV-2 target nucleic acids are NOT DETECTED.  The SARS-CoV-2 RNA is generally detectable in  upper respiratory specimens during the acute phase of infection. The lowest concentration of SARS-CoV-2 viral copies this assay can detect is 138 copies/mL. A negative result does not preclude SARS-Cov-2 infection and should not be used as the sole basis for treatment or other patient management decisions. A negative result may occur with  improper specimen collection/handling, submission of specimen other than nasopharyngeal swab, presence of viral mutation(s) within the areas targeted by this assay, and inadequate number of viral copies(<138 copies/mL). A negative result must be combined with clinical observations, patient history, and epidemiological information. The expected result is Negative.  Fact Sheet for Patients:  EntrepreneurPulse.com.au  Fact Sheet for Healthcare Providers:  IncredibleEmployment.be  This test is no t yet approved or cleared by the [place] FDA and  has been authorized for detection and/or diagnosis of SARS-CoV-2 by FDA under an Emergency Use Authorization (EUA). This EUA will remain  in effect (meaning this test can be used) for the duration of the COVID-19 declaration under Section 564(b)(1) of the Act, 21 U.S.C.section 360bbb-3(b)(1), unless the authorization is terminated  or revoked sooner.       Influenza A by PCR NEGATIVE NEGATIVE Final   Influenza B by PCR NEGATIVE NEGATIVE Final    Comment: (NOTE) The Xpert Xpress SARS-CoV-2/FLU/RSV plus assay is intended as an aid in the diagnosis of influenza from Nasopharyngeal swab specimens and should not be used as a sole basis for treatment. Nasal washings and aspirates are unacceptable for Xpert Xpress SARS-CoV-2/FLU/RSV testing.  Fact Sheet for Patients: EntrepreneurPulse.com.au  Fact Sheet for Healthcare Providers: IncredibleEmployment.be  This test is not yet approved or cleared by the [place] FDA and has been  authorized for detection and/or diagnosis of SARS-CoV-2 by FDA under an Emergency Use Authorization (EUA). This EUA will remain in effect (meaning this test can be used) for the duration of the COVID-19 declaration under Section 564(b)(1) of the Act, 21 U.S.C. section 360bbb-3(b)(1), unless the authorization is terminated or revoked.  Performed at [facility], [address] [place], [place] [address]     RADIOLOGY:  DG Knee 1-2  Views Left  Result Date: 01/13/2021 CLINICAL DATA:  Left knee pain common no known injury, initial encounter EXAM: LEFT KNEE - 2 VIEW COMPARISON:  None. FINDINGS: Tricompartmental degenerative changes are noted. Small joint effusion is seen. Some calcific loose bodies are seen as well. No acute bony abnormality is noted. IMPRESSION: Degenerative change with joint effusion. No acute abnormality noted. Electronically Signed   By: Inez Catalina M.D.   On: 01/13/2021 12:38   PERIPHERAL VASCULAR CATHETERIZATION  Result Date: 01/12/2021 See surgical note for result.    CODE STATUS:     Code Status Orders  (From admission, onward)           Start     Ordered   01/10/21 1418  Full code  Continuous        01/10/21 1419           Code Status History     Date Active Date Inactive Code Status Order ID Comments User Context   12/09/2020 0848 12/10/2020 1602 Full Code [id number]  Isaias Cowman, MD Inpatient      Advance Directive Documentation    Flowsheet Row Most Recent Value  Type of Advance Directive Living will, Healthcare Power of Attorney  Pre-existing out of facility DNR order (yellow form or pink MOST form) --  "MOST" Form in Place? --        TOTAL TIME TAKING CARE OF THIS PATIENT: 40 minutes.    Fritzi Mandes M.D  [facility]    CC: Primary care physician; Matthew Clayton., MD

## 2021-01-14 ENCOUNTER — Ambulatory Visit (INDEPENDENT_AMBULATORY_CARE_PROVIDER_SITE_OTHER): Payer: PPO | Admitting: Family Medicine

## 2021-01-14 ENCOUNTER — Other Ambulatory Visit: Payer: Self-pay

## 2021-01-14 ENCOUNTER — Encounter: Payer: Self-pay | Admitting: Family Medicine

## 2021-01-14 VITALS — BP 116/71 | HR 82 | Resp 18 | Wt 153.0 lb

## 2021-01-14 DIAGNOSIS — I824Y3 Acute embolism and thrombosis of unspecified deep veins of proximal lower extremity, bilateral: Secondary | ICD-10-CM

## 2021-01-14 DIAGNOSIS — R059 Cough, unspecified: Secondary | ICD-10-CM

## 2021-01-14 DIAGNOSIS — E782 Mixed hyperlipidemia: Secondary | ICD-10-CM

## 2021-01-14 DIAGNOSIS — I2699 Other pulmonary embolism without acute cor pulmonale: Secondary | ICD-10-CM | POA: Diagnosis not present

## 2021-01-14 NOTE — Progress Notes (Signed)
I,[name],acting as a scribe for Matthew Durie, MD.,have documented all relevant documentation on the behalf of Matthew Durie, MD,as directed by  Matthew Durie, MD while in the presence of Matthew Durie, MD.   Established patient visit   Patient: Matthew Clayton   DOB: 25-Aug-1926   87 y.o. Male  MRN: [id number] Visit Date: 01/14/2021  Today's healthcare provider: [name], MD   Chief Complaint  Patient presents with   Hospitalization Follow-up   Subjective  -------------------------------------------------------------------------------------------------------------------- HPI  Patient comes in today for follow-up after hospitalization for bilateral DVT followed by acute pulmonary embolus.  He underwent thrombectomy by Dr. Lucky Cowboy.  The chest pain that was experiencing at the time is improving since his thrombectomy.  He is starting to feel better.  His breathing is fine. Follow up Hospitalization  Patient was admitted to [facility] on 01/10/2021 and discharged on 01/13/2021. He was treated for DVT. Treatment for this included; see notes in chart. Telephone follow up was done on: none He reports good compliance with treatment. He reports this condition is improved.  ----------------------------------------------------------------------------------------- -  Patient has improved since hospital discharge. However, he is still having stiffness and soreness. Also patient is still having cough, sob, and pain in rib area.    Medications: Outpatient Medications Prior to Visit  Medication Sig   apixaban (ELIQUIS) 5 MG TABS tablet Take 2 tablets (10 mg total) by mouth 2 (two) times daily. And from 01/20/2021 take 5 mg Twice a day   benzonatate (TESSALON) 100 MG capsule Take 1 capsule (100 mg total) by mouth 3 (three) times daily as needed for cough.   diclofenac Sodium (VOLTAREN) 1 % GEL Apply 2 g topically 2 (two) times daily.   gabapentin (NEURONTIN) 100 MG  capsule Take 4 capsules (400 mg total) by mouth 2 (two) times daily. Then taper down by 200 mg on every Sunday (weekly) and stop   simvastatin (ZOCOR) 40 MG tablet TAKE 1 TABLET (40 MG TOTAL) BY MOUTH DAILY. NEEDS APPT FOR FURTHER RX.   No facility-administered medications prior to visit.    Review of Systems  Constitutional:  Negative for appetite change, chills and fever.  Respiratory:  Negative for chest tightness, shortness of breath and wheezing.   Cardiovascular:  Negative for chest pain and palpitations.  Gastrointestinal:  Negative for abdominal pain, nausea and vomiting.       Objective  -------------------------------------------------------------------------------------------------------------------- There were no vitals taken for this visit. BP Readings from Last 3 Encounters:  01/14/21 116/71  01/13/21 108/68  12/12/20 102/64   Wt Readings from Last 3 Encounters:  01/14/21 153 lb (69.4 kg)  01/10/21 148 lb (67.1 kg)  12/12/20 156 lb 6.4 oz (70.9 kg)      Physical Exam Vitals reviewed.  Constitutional:      Appearance: Normal appearance. He is well-developed.  HENT:     Head: Normocephalic and atraumatic.     Right Ear: Tympanic membrane and external ear normal.     Left Ear: Tympanic membrane and external ear normal.     Nose: Nose normal.     Mouth/Throat:     Mouth: Mucous membranes are moist.     Pharynx: Oropharynx is clear.  Eyes:     General: No scleral icterus.    Conjunctiva/sclera: Conjunctivae normal.  Neck:     Thyroid: No thyromegaly.     Vascular: No carotid bruit.  Cardiovascular:     Rate and Rhythm: Normal rate and  regular rhythm.     Heart sounds: Normal heart sounds.  Pulmonary:     Effort: Pulmonary effort is normal.     Breath sounds: Normal breath sounds.  Abdominal:     Palpations: Abdomen is soft.  Lymphadenopathy:     Cervical: No cervical adenopathy.  Skin:    General: Skin is warm and dry.  Neurological:     General: No  focal deficit present.     Mental Status: He is alert and oriented to person, place, and time.  Psychiatric:        Mood and Affect: Mood normal.        Behavior: Behavior normal.        Thought Content: Thought content normal.        Judgment: Judgment normal.      No results found for any visits on 01/14/21.  Assessment & Plan  ---------------------------------------------------------------------------------------------------------------------- 1. Embolism, pulmonary with infarction [facility]) Patient is status post thrombectomy.  Will need lifelong Eliquis more likely.  2. Acute deep vein thrombosis (DVT) of proximal vein of both lower extremities (HCC) On Eliquis, has appoint with Dr. Lucky Cowboy for follow-up both of these issues.  3. Mixed hyperlipidemia On Zocor 40  4. Cough Improving after thrombectomy   No follow-ups on file.      I, Matthew Durie, MD, have reviewed all documentation for this visit. The documentation on 01/18/21 for the exam, diagnosis, procedures, and orders are all accurate and complete.    Matthew Ruscitti Cranford Mon, MD  [facility] [phone] (phone) [phone] (fax)  [place]

## 2021-01-16 DIAGNOSIS — I82403 Acute embolism and thrombosis of unspecified deep veins of lower extremity, bilateral: Secondary | ICD-10-CM | POA: Insufficient documentation

## 2021-01-16 NOTE — Progress Notes (Signed)
[place]  Telephone:([phone]) [phone] Fax:(336) [phone]  ID: Matthew Clayton OB: 1927/01/25  MR#: [id number]  [id number]  Patient Care Team: Jerrol Banana., MD as PCP - General (Family Medicine) Schnier, Dolores Lory, MD (Vascular Surgery) Lorelee Cover., MD (Ophthalmology)  CHIEF COMPLAINT: Recurrent PE/DVT  INTERVAL HISTORY: Patient is a 87 year old male who was last evaluated in clinic in July 2018 was recently admitted to the hospital and found to have extensive bilateral DVT as well as PE requiring thrombectomy.  He currently is on Eliquis and tolerating treatment well.  He denies any recent fevers or illnesses.  He has no obvious transient risk factors.  He has a history of DVT after traveling to [place].  He has no neurologic complaints.  He has a good appetite and denies weight loss.  He denies any chest pain, shortness of breath, cough, or hemoptysis.  He has no nausea, vomiting, constipation, or diarrhea.  He has no urinary complaints.  Patient feels at his baseline and offers no specific complaints today.  REVIEW OF SYSTEMS:   Review of Systems  Constitutional: Negative.  Negative for fever, malaise/fatigue and weight loss.  Respiratory: Negative.  Negative for cough, hemoptysis and shortness of breath.   Cardiovascular: Negative.  Negative for chest pain and leg swelling.  Gastrointestinal: Negative.  Negative for abdominal pain.  Genitourinary: Negative.  Negative for dysuria.  Musculoskeletal: Negative.  Negative for back pain.  Skin: Negative.  Negative for rash.  Neurological: Negative.  Negative for dizziness, focal weakness, weakness and headaches.  Endo/Heme/Allergies:  Does not bruise/bleed easily.  Psychiatric/Behavioral: Negative.  The patient is not nervous/anxious.    As per HPI. Otherwise, a complete review of systems is negative.  PAST MEDICAL HISTORY: Past Medical History:  Diagnosis Date   DVT (deep venous thrombosis) ([place])     left leg   Hyperlipidemia     PAST SURGICAL HISTORY: Past Surgical History:  Procedure Laterality Date   APPENDECTOMY     MOLE REMOVAL     PACEMAKER LEADLESS INSERTION N/A 12/09/2020   Procedure: PACEMAKER LEADLESS INSERTION;  Surgeon: Isaias Cowman, MD;  Location: [facility];  Service: Cardiovascular;  Laterality: N/A;   PULMONARY THROMBECTOMY Bilateral 01/12/2021   Procedure: PULMONARY THROMBECTOMY;  Surgeon: Algernon Huxley, MD;  Location: [facility];  Service: Cardiovascular;  Laterality: Bilateral;   TONSILLECTOMY AND ADENOIDECTOMY     TOTAL HIP ARTHROPLASTY Bilateral     FAMILY HISTORY: Family History  Problem Relation Age of Onset   Breast cancer Mother    Cancer Mother        eye cancer   Heart disease Father    Breast cancer Sister    Stroke Brother    Heart attack Brother    Breast cancer Sister    Aneurysm Sister     ADVANCED DIRECTIVES (Y/N):  N  HEALTH MAINTENANCE: Social History   Tobacco Use   Smoking status: Former    Packs/day: 1.50    Years: 20.00    Pack years: 30.00    Types: Cigarettes    Quit date: 05/16/1960    Years since quitting: 60.7   Smokeless tobacco: Never   Tobacco comments:    quit in 1968  Substance Use Topics   Alcohol use: No    Alcohol/week: 0.0 standard drinks   Drug use: No     Colonoscopy:  PAP:  Bone density:  Lipid panel:  No Known Allergies  Current Outpatient Medications  Medication Sig Dispense Refill   apixaban (ELIQUIS) 5 MG TABS tablet Take 2 tablets (10 mg total) by mouth 2 (two) times daily. And from 01/20/2021 take 5 mg Twice a day 60 tablet 3   diclofenac Sodium (VOLTAREN) 1 % GEL Apply 2 g topically 2 (two) times daily. 2 g 0   simvastatin (ZOCOR) 40 MG tablet TAKE 1 TABLET (40 MG TOTAL) BY MOUTH DAILY. NEEDS APPT FOR FURTHER RX. 90 tablet 3   gabapentin (NEURONTIN) 100 MG capsule Take 1 capsule (100 mg total) by mouth as directed. Then taper down by 200 mg on every Sunday  (weekly) and stop 22 capsule 0   No current facility-administered medications for this visit.    OBJECTIVE: Vitals:   01/22/21 1122  BP: 92/62  Pulse: 88  Resp: 20  Temp: 97.6 F (36.4 C)     Body mass index is 24.98 kg/m.    ECOG FS:0 - Asymptomatic  General: Well-developed, well-nourished, no acute distress. Eyes: Pink conjunctiva, anicteric sclera. HEENT: Normocephalic, moist mucous membranes. Lungs: No audible wheezing or coughing. Heart: Regular rate and rhythm. Abdomen: Soft, nontender, no obvious distention. Musculoskeletal: No edema, cyanosis, or clubbing. Neuro: Alert, answering all questions appropriately. Cranial nerves grossly intact. Skin: No rashes or petechiae noted. Psych: Normal affect. Lymphatics: No cervical, calvicular, axillary or inguinal LAD.   LAB RESULTS:  Lab Results  Component Value Date   NA 136 01/13/2021   K 3.8 01/13/2021   CL 104 01/13/2021   CO2 25 01/13/2021   GLUCOSE 115 (H) 01/13/2021   BUN 17 01/13/2021   CREATININE 1.16 01/13/2021   CALCIUM 8.4 (L) 01/13/2021   PROT 6.9 01/10/2021   ALBUMIN 3.3 (L) 01/10/2021   AST 19 01/10/2021   ALT 14 01/10/2021   ALKPHOS 66 01/10/2021   BILITOT 1.0 01/10/2021   GFRNONAA 59 (L) 01/13/2021   GFRAA 47 (L) 09/13/2019    Lab Results  Component Value Date   WBC 7.6 01/13/2021   NEUTROABS 6.8 01/10/2021   HGB 11.0 (L) 01/13/2021   HCT 32.1 (L) 01/13/2021   MCV 97.6 01/13/2021   PLT 307 01/13/2021     STUDIES: DG Knee 1-2 Views Left  Result Date: 01/13/2021 CLINICAL DATA:  Left knee pain common no known injury, initial encounter EXAM: LEFT KNEE - 2 VIEW COMPARISON:  None. FINDINGS: Tricompartmental degenerative changes are noted. Small joint effusion is seen. Some calcific loose bodies are seen as well. No acute bony abnormality is noted. IMPRESSION: Degenerative change with joint effusion. No acute abnormality noted. Electronically Signed   By: Inez Catalina M.D.   On: 01/13/2021 12:38    CT Angio Chest PE W and/or Wo Contrast  Result Date: 01/10/2021 CLINICAL DATA:  Lower extremity DVT. EXAM: CT ANGIOGRAPHY CHEST WITH CONTRAST TECHNIQUE: Multidetector CT imaging of the chest was performed using the standard protocol during bolus administration of intravenous contrast. Multiplanar CT image reconstructions and MIPs were obtained to evaluate the vascular anatomy. CONTRAST:  40m OMNIPAQUE IOHEXOL 350 MG/ML SOLN COMPARISON:  CTA chest dated May 17, 2016. FINDINGS: Cardiovascular: Satisfactory opacification of the pulmonary arteries to the segmental level. Acute occlusive pulmonary emboli involving the right middle lobe and left lower lobe lateral basal segment. More wall adherent near occlusive pulmonary emboli in the bilateral interlobar and lower lobe segmental pulmonary arteries. Mild right heart enlargement with elevated RV/LV ratio of 1.3. No pericardial effusion. Reflux of contrast into the IVC. Mediastinum/Nodes: No enlarged mediastinal, hilar, or axillary lymph nodes. Thyroid gland,  trachea, and esophagus demonstrate no significant findings. Lungs/Pleura: Peripheral ground-glass density in the right middle lobe (series 6, image 51). Trace right pleural effusion. No pneumothorax. Mild subpleural cystic change and increased reticulation in both lungs, slightly progressed since 2018. Upper Abdomen: No acute abnormality. Musculoskeletal: No chest wall abnormality. No acute or significant osseous findings. Review of the MIP images confirms the above findings. IMPRESSION: 1. Acute on chronic occlusive and nonocclusive lobar and segmental pulmonary emboli involving the right middle lobe and both lower lobes. Positive for acute PE with CT evidence of right heart strain (RV/LV Ratio = 1.3) consistent with at least submassive (intermediate risk) PE. The presence of right heart strain has been associated with an increased risk of morbidity and mortality. 2. Peripheral ground-glass density in the  right middle lobe, concerning for pulmonary infarct. 3. Trace right pleural effusion. 4. Mild chronic interstitial lung disease, slightly progressed since 2018. Critical Value/emergent results were called by telephone at the time of interpretation on 01/10/2021 at 2:02 pm to provider [facility] , who verbally acknowledged these results. Electronically Signed   By: Titus Dubin M.D.   On: 01/10/2021 14:08   PERIPHERAL VASCULAR CATHETERIZATION  Result Date: 01/12/2021 See surgical note for result.  US Venous Img Lower Bilateral (DVT)  Result Date: 01/10/2021 CLINICAL DATA:  87 year old male with lower extremity edema EXAM: BILATERAL LOWER EXTREMITY VENOUS DOPPLER ULTRASOUND TECHNIQUE: Gray-scale sonography with graded compression, as well as color Doppler and duplex ultrasound were performed to evaluate the lower extremity deep venous systems from the level of the common femoral vein and including the common femoral, femoral, profunda femoral, popliteal and calf veins including the posterior tibial, peroneal and gastrocnemius veins when visible. The superficial great saphenous vein was also interrogated. Spectral Doppler was utilized to evaluate flow at rest and with distal augmentation maneuvers in the common femoral, femoral and popliteal veins. COMPARISON:  None. FINDINGS: RIGHT LOWER EXTREMITY Common Femoral Vein: The common femoral vein is only partially compressible. Wall adherent thrombus is visualized on color Doppler imaging. Positive flow present on color Doppler imaging. Saphenofemoral Junction: No evidence of thrombus. Normal compressibility and flow on color Doppler imaging. Profunda Femoral Vein: No evidence of thrombus. Normal compressibility and flow on color Doppler imaging. Femoral Vein: No evidence of thrombus. Normal compressibility, respiratory phasicity and response to augmentation. Popliteal Vein: No evidence of thrombus. Normal compressibility, respiratory phasicity and response to  augmentation. Calf Veins: No evidence of thrombus. Normal compressibility and flow on color Doppler imaging. Superficial Great Saphenous Vein: No evidence of thrombus. Normal compressibility. Venous Reflux:  None. Other Findings:  None. LEFT LOWER EXTREMITY Common Femoral Vein: No evidence of thrombus. Normal compressibility, respiratory phasicity and response to augmentation. Saphenofemoral Junction: No evidence of thrombus. Normal compressibility and flow on color Doppler imaging. Profunda Femoral Vein: No evidence of thrombus. Normal compressibility and flow on color Doppler imaging. Femoral Vein: No evidence of thrombus. Normal compressibility, respiratory phasicity and response to augmentation. Popliteal Vein: Only partially compressible. There is some eccentric wall thickening versus wall adherent thrombus in the proximal aspect of the popliteal vein. However, distally the vessel becomes completely occluded. Calf Veins: Occlusive thrombus extends into the posterior tibial and peroneal veins. Superficial Great Saphenous Vein: No evidence of thrombus. Normal compressibility. Venous Reflux:  None. Other Findings:  None. IMPRESSION: 1. Positive for nonocclusive wall adherent DVT within the right common femoral vein. 2. Also positive for occlusive DVT in the left popliteal and calf veins. Electronically Signed   By: Myrle Sheng  Laurence Ferrari M.D.   On: 01/10/2021 11:35   DG Chest Portable 1 View  Result Date: 01/10/2021 CLINICAL DATA:  right sided chest pain EXAM: PORTABLE CHEST 1 VIEW COMPARISON:  Same day CT. FINDINGS: The cardiomediastinal silhouette is unchanged in contour.Cardiac loop recorder. No pleural effusion. No pneumothorax. Hazy opacities of the RIGHT lateral lung. Visualized abdomen is unremarkable. Advanced degenerative changes of the bilateral shoulders. IMPRESSION: Hazy opacity of the RIGHT lateral lung, possibly reflecting pulmonary infarction given history of pulmonary embolism. Additional  differential considerations include infection or aspiration. Electronically Signed   By: Valentino Saxon M.D.   On: 01/10/2021 14:15   ECHOCARDIOGRAM COMPLETE  Result Date: 01/11/2021    ECHOCARDIOGRAM REPORT   Patient Name:   Matthew Clayton Date of Exam: 01/11/2021 Medical Rec #:  [id number]     Height:       65.0 in Accession #:    [id number]    Weight:       148.0 lb Date of Birth:  [birth year]     BSA:          1.741 m Patient Age:    58 years      BP:           98/58 mmHg Patient Gender: M             HR:           76 bpm. Exam Location:  [facility] Procedure: 2D Echo Indications:     Pulmonary Embolus  History:         Patient has no prior history of Echocardiogram examinations.                  Risk Factors:Hypertension and Dyslipidemia.  Sonographer:     L Thornton-Maynard Referring Phys:  [id number] AMY N COX Diagnosing Phys: Nelva Bush MD IMPRESSIONS  1. Left ventricular ejection fraction, by estimation, is 55 to 60%. The left ventricle has normal function. The left ventricle has no regional wall motion abnormalities. There is mild left ventricular hypertrophy. Left ventricular diastolic parameters are consistent with Grade II diastolic dysfunction (pseudonormalization).  2. Right ventricular systolic function is normal. The right ventricular size is mildly enlarged. There is normal pulmonary artery systolic pressure.  3. The mitral valve is grossly normal. Mild mitral valve regurgitation. No evidence of mitral stenosis.  4. The aortic valve has an indeterminant number of cusps. There is moderate calcification of the aortic valve. There is severe thickening of the aortic valve. Aortic valve regurgitation is not visualized. Mild aortic valve stenosis.  5. The inferior vena cava is dilated in size with >50% respiratory variability, suggesting right atrial pressure of 8 mmHg. FINDINGS  Left Ventricle: Left ventricular ejection fraction, by estimation, is 55 to 60%. The left ventricle has normal function. The  left ventricle has no regional wall motion abnormalities. The left ventricular internal cavity size was normal in size. There is  mild left ventricular hypertrophy. Left ventricular diastolic parameters are consistent with Grade II diastolic dysfunction (pseudonormalization). Right Ventricle: The right ventricular size is mildly enlarged. No increase in right ventricular wall thickness. Right ventricular systolic function is normal. There is normal pulmonary artery systolic pressure. The tricuspid regurgitant velocity is 2.14  m/s, and with an assumed right atrial pressure of 8 mmHg, the estimated right ventricular systolic pressure is 123456 mmHg. Left Atrium: Left atrial size was normal in size. Right Atrium: Right atrial size was not well visualized. Pericardium: There is no evidence of pericardial effusion.  Mitral Valve: The mitral valve is grossly normal. Mild mitral valve regurgitation. No evidence of mitral valve stenosis. Tricuspid Valve: The tricuspid valve is not well visualized. Tricuspid valve regurgitation is trivial. Aortic Valve: The aortic valve has an indeterminant number of cusps. There is moderate calcification of the aortic valve. There is severe thickening of the aortic valve. Aortic valve regurgitation is not visualized. Mild aortic stenosis is present. Aortic valve mean gradient measures 14.0 mmHg. Aortic valve peak gradient measures 27.9 mmHg. Aortic valve area, by VTI measures 1.61 cm. Pulmonic Valve: The pulmonic valve was not well visualized. Pulmonic valve regurgitation is not visualized. No evidence of pulmonic stenosis. Aorta: The aortic root is normal in size and structure. Pulmonary Artery: The pulmonary artery is not well seen. Venous: The inferior vena cava is dilated in size with greater than 50% respiratory variability, suggesting right atrial pressure of 8 mmHg. IAS/Shunts: No atrial level shunt detected by color flow Doppler.  LEFT VENTRICLE PLAX 2D LVIDd:         3.92 cm   Diastology LVIDs:         2.61 cm  LV e' medial:    7.07 cm/s LV PW:         1.35 cm  LV E/e' medial:  8.6 LV IVS:        1.22 cm  LV e' lateral:   8.05 cm/s LVOT diam:     2.10 cm  LV E/e' lateral: 7.6 LV SV:         79 LV SV Index:   46 LVOT Area:     3.46 cm  RIGHT VENTRICLE RV Basal diam:  4.42 cm RV S prime:     11.00 cm/s TAPSE (M-mode): 2.1 cm LEFT ATRIUM             Index LA diam:        2.40 cm 1.38 cm/m LA Vol (A2C):   38.8 ml 22.29 ml/m LA Vol (A4C):   26.0 ml 14.94 ml/m LA Biplane Vol: 33.2 ml 19.07 ml/m  AORTIC VALVE                    PULMONIC VALVE AV Area (Vmax):    1.42 cm     PV Vmax:       0.88 m/s AV Area (Vmean):   1.49 cm     PV Peak grad:  3.1 mmHg AV Area (VTI):     1.61 cm AV Vmax:           264.00 cm/s AV Vmean:          176.000 cm/s AV VTI:            0.494 m AV Peak Grad:      27.9 mmHg AV Mean Grad:      14.0 mmHg LVOT Vmax:         108.00 cm/s LVOT Vmean:        75.900 cm/s LVOT VTI:          0.229 m LVOT/AV VTI ratio: 0.46  AORTA Ao Root diam: 3.40 cm MV E velocity: 60.80 cm/s  TRICUSPID VALVE MV A velocity: 55.70 cm/s  TR Peak grad:   18.3 mmHg MV E/A ratio:  1.09        TR Vmax:        214.00 cm/s  SHUNTS                            Systemic VTI:  0.23 m                            Systemic Diam: 2.10 cm Nelva Bush MD Electronically signed by Nelva Bush MD Signature Date/Time: 01/11/2021/3:03:01 PM    Final     ASSESSMENT: Recurrent PE/DVT  PLAN:    Recurrent PE/DVT: Patient's initial DVT was in 2018 after an extended flight home from [place].  At that time, he took 3 months of Eliquis and then discontinued.  He has no obvious risk factors with his most recent DVT/PE.  Given his second lifetime DVT and the extent of clot burden recently, patient agreed to continue lifelong Eliquis.  Previously, factor V Leiden and prothrombin gene mutation were negative.  No further intervention is needed.  No further follow-up has been scheduled.  Please  refer patient back if there are any questions or concerns.  I spent a total of 45 minutes reviewing chart data, face-to-face evaluation with the patient, counseling and coordination of care as detailed above.  Patient expressed understanding and was in agreement with this plan. He also understands that He can call clinic at any time with any questions, concerns, or complaints.    Lloyd Huger, MD   01/24/2021 7:18 AM

## 2021-01-20 ENCOUNTER — Telehealth (INDEPENDENT_AMBULATORY_CARE_PROVIDER_SITE_OTHER): Payer: Self-pay

## 2021-01-20 NOTE — Telephone Encounter (Signed)
Patient had pulmonary thrombectomy on 01/12/21 with Dr Lucky Cowboy. Patient left voicemail asking if he could start back driving. I spoke with Dr Lucky Cowboy and he is fine with the patient driving. Patient was made aware.

## 2021-01-22 ENCOUNTER — Other Ambulatory Visit: Payer: Self-pay

## 2021-01-22 ENCOUNTER — Encounter: Payer: Self-pay | Admitting: Oncology

## 2021-01-22 ENCOUNTER — Inpatient Hospital Stay: Payer: PPO | Attending: Oncology | Admitting: Oncology

## 2021-01-22 VITALS — BP 92/62 | HR 88 | Temp 97.6°F | Resp 20 | Wt 150.1 lb

## 2021-01-22 DIAGNOSIS — I2699 Other pulmonary embolism without acute cor pulmonale: Secondary | ICD-10-CM | POA: Diagnosis not present

## 2021-01-22 DIAGNOSIS — I824Y3 Acute embolism and thrombosis of unspecified deep veins of proximal lower extremity, bilateral: Secondary | ICD-10-CM | POA: Insufficient documentation

## 2021-01-22 DIAGNOSIS — Z7901 Long term (current) use of anticoagulants: Secondary | ICD-10-CM | POA: Insufficient documentation

## 2021-01-22 MED ORDER — GABAPENTIN 100 MG PO CAPS: 100.0000 mg | ORAL_CAPSULE | ORAL | 0 refills | Status: DC

## 2021-01-22 NOTE — Telephone Encounter (Signed)
Patient was made aware with medical recommendations and verbalized understanding 

## 2021-01-22 NOTE — Telephone Encounter (Signed)
Patient left a voicemail stating [place] and 20 minutes [address] within 2 days. Patient would like to know if there are any restrictions. I spoke with Eulogio Ditch NP and she recommended for the patient to stop mid ways and walk some and to wear compressions stockings. I left a message on the patient voicemail to return a call.

## 2021-01-22 NOTE — Progress Notes (Signed)
Patient here for post hospital follow up for DVT.

## 2021-01-26 ENCOUNTER — Telehealth: Payer: Self-pay

## 2021-01-26 NOTE — Telephone Encounter (Signed)
Copied from [place] [phone]. Topic: General - Other >> Jan 26, 2021  2:47 PM Tessa Lerner A wrote: Reason for CRM: Patient has called requesting 9 tablets of gabapentin (NEURONTIN) 100 MG capsule [id number]    The patient estimates that they will need this exact number of tablets for a proper reduction of the medication  The patient is also requesting a prescription for a prescription for apixaban (ELIQUIS) 5 MG TABS tablet [id number]   The patient would like to prescription submitted to their preferred pharmacy

## 2021-01-27 NOTE — Telephone Encounter (Signed)
Ok to send in gabapentin and eliquis? Please advise. Thanks!

## 2021-01-28 MED ORDER — GABAPENTIN 100 MG PO CAPS: 100.0000 mg | ORAL_CAPSULE | ORAL | 0 refills | Status: DC

## 2021-01-28 MED ORDER — APIXABAN 5 MG PO TABS: 10.0000 mg | ORAL_TABLET | Freq: Two times a day (BID) | ORAL | 3 refills | Status: DC

## 2021-01-28 NOTE — Telephone Encounter (Signed)
Sent in as requested 

## 2021-02-02 DIAGNOSIS — H25813 Combined forms of age-related cataract, bilateral: Secondary | ICD-10-CM | POA: Diagnosis not present

## 2021-02-06 ENCOUNTER — Other Ambulatory Visit: Payer: Self-pay | Admitting: Family Medicine

## 2021-02-06 ENCOUNTER — Other Ambulatory Visit: Payer: Self-pay

## 2021-02-06 ENCOUNTER — Ambulatory Visit (INDEPENDENT_AMBULATORY_CARE_PROVIDER_SITE_OTHER): Payer: PPO | Admitting: Vascular Surgery

## 2021-02-06 VITALS — BP 111/67 | HR 67 | Ht 66.0 in | Wt 151.0 lb

## 2021-02-06 DIAGNOSIS — I2699 Other pulmonary embolism without acute cor pulmonale: Secondary | ICD-10-CM

## 2021-02-06 DIAGNOSIS — I10 Essential (primary) hypertension: Secondary | ICD-10-CM | POA: Diagnosis not present

## 2021-02-06 DIAGNOSIS — I824Y2 Acute embolism and thrombosis of unspecified deep veins of left proximal lower extremity: Secondary | ICD-10-CM

## 2021-02-06 NOTE — Progress Notes (Signed)
MRN : [id number]  Matthew Clayton is a 87 y.o. ([birth year]) male who presents with chief complaint of  Chief Complaint  Patient presents with   Follow-up    2 wk [facility] post pulmonary thrombectomy   .  History of Present Illness: Patient returns today in follow up of his pulmonary embolus.  He is about 2 weeks status post pulmonary thrombectomy.  He is doing well.  He appears to be at his baseline of breathing.  He had no periprocedural complications.  He is tolerating anticoagulation well.  He has some mild left lower extremity swelling but this has been chronic since his original DVT back in 2018.  This is a second thrombotic event.  Current Outpatient Medications  Medication Sig Dispense Refill   apixaban (ELIQUIS) 5 MG TABS tablet Take 2 tablets (10 mg total) by mouth 2 (two) times daily. And from 01/20/2021 take 5 mg Twice a day 60 tablet 3   diclofenac Sodium (VOLTAREN) 1 % GEL Apply 2 g topically 2 (two) times daily. (Patient not taking: Reported on 02/06/2021) 2 g 0   gabapentin (NEURONTIN) 100 MG capsule Take 1 capsule (100 mg total) by mouth as directed. Then taper down by 200 mg on every Sunday (weekly) and stop (Patient not taking: Reported on 02/06/2021) 9 capsule 0   simvastatin (ZOCOR) 40 MG tablet TAKE 1 TABLET (40 MG TOTAL) BY MOUTH DAILY. 90 tablet 2   No current facility-administered medications for this visit.    Past Medical History:  Diagnosis Date   DVT (deep venous thrombosis) (HCC)    left leg   Hyperlipidemia     Past Surgical History:  Procedure Laterality Date   APPENDECTOMY     MOLE REMOVAL     PACEMAKER LEADLESS INSERTION N/A 12/09/2020   Procedure: PACEMAKER LEADLESS INSERTION;  Surgeon: Isaias Cowman, MD;  Location: [facility];  Service: Cardiovascular;  Laterality: N/A;   PULMONARY THROMBECTOMY Bilateral 01/12/2021   Procedure: PULMONARY THROMBECTOMY;  Surgeon: Algernon Huxley, MD;  Location: [facility];  Service: Cardiovascular;   Laterality: Bilateral;   TONSILLECTOMY AND ADENOIDECTOMY     TOTAL HIP ARTHROPLASTY Bilateral      Social History   Tobacco Use   Smoking status: Former    Packs/day: 1.50    Years: 20.00    Pack years: 30.00    Types: Cigarettes    Quit date: 05/16/1960    Years since quitting: 60.7   Smokeless tobacco: Never   Tobacco comments:    quit in 1968  Substance Use Topics   Alcohol use: No    Alcohol/week: 0.0 standard drinks   Drug use: No       Family History  Problem Relation Age of Onset   Breast cancer Mother    Cancer Mother        eye cancer   Heart disease Father    Breast cancer Sister    Stroke Brother    Heart attack Brother    Breast cancer Sister    Aneurysm Sister      No Known Allergies   REVIEW OF SYSTEMS (Negative unless checked)  Constitutional: [] Weight loss  [] Fever  [] Chills Cardiac: [] Chest pain   [] Chest pressure   [] Palpitations   [] Shortness of breath when laying flat   [] Shortness of breath at rest   [x] Shortness of breath with exertion. Vascular:  [] Pain in legs with walking   [] Pain in legs at rest   [] Pain in  legs when laying flat   [] Claudication   [] Pain in feet when walking  [] Pain in feet at rest  [] Pain in feet when laying flat   [x] History of DVT   [x] Phlebitis   [x] Swelling in legs   [] Varicose veins   [] Non-healing ulcers Pulmonary:   [] Uses home oxygen   [] Productive cough   [] Hemoptysis   [] Wheeze  [] COPD   [] Asthma Neurologic:  [] Dizziness  [] Blackouts   [] Seizures   [] History of stroke   [] History of TIA  [] Aphasia   [] Temporary blindness   [] Dysphagia   [] Weakness or numbness in arms   [] Weakness or numbness in legs Musculoskeletal:  [] Arthritis   [] Joint swelling   [] Joint pain   [] Low back pain Hematologic:  [] Easy bruising  [] Easy bleeding   [] Hypercoagulable state   [] Anemic   Gastrointestinal:  [] Blood in stool   [] Vomiting blood  [] Gastroesophageal reflux/heartburn   [] Abdominal pain Genitourinary:  [] Chronic kidney  disease   [] Difficult urination  [] Frequent urination  [] Burning with urination   [] Hematuria Skin:  [] Rashes   [] Ulcers   [] Wounds Psychological:  [] History of anxiety   []  History of major depression.  Physical Examination  BP 111/67   Pulse 67   Ht 5\' 6"  (1.676 m)   Wt 151 lb (68.5 kg)   BMI 24.37 kg/m  Gen:  WD/WN, NAD. Appears far younger than stated age. Head: [place]/AT, No temporalis wasting. Ear/Nose/Throat: Hearing grossly intact, nares w/o erythema or drainage Eyes: Conjunctiva clear. Sclera non-icteric Neck: Supple.  Trachea midline Pulmonary:  Good air movement, no use of accessory muscles.  Cardiac: irregular Vascular:  Vessel Right Left  Radial Palpable Palpable               Musculoskeletal: M/S 5/5 throughout.  No deformity or atrophy. 1+ LLE edema. Neurologic: Sensation grossly intact in extremities.  Symmetrical.  Speech is fluent.  Psychiatric: Judgment intact, Mood & affect appropriate for pt's clinical situation. Dermatologic: No rashes or ulcers noted.  No cellulitis or open wounds.      Labs Recent Results (from the past 2160 hour(s))  SARS Coronavirus 2 by RT PCR (hospital order, performed in [facility] hospital lab) Nasopharyngeal Nasopharyngeal Swab     Status: None   Collection Time: 12/09/20  7:11 AM   Specimen: Nasopharyngeal Swab  Result Value Ref Range   SARS Coronavirus 2 NEGATIVE NEGATIVE    Comment: (NOTE) SARS-CoV-2 target nucleic acids are NOT DETECTED.  The SARS-CoV-2 RNA is generally detectable in upper and lower respiratory specimens during the acute phase of infection. The lowest concentration of SARS-CoV-2 viral copies this assay can detect is 250 copies / mL. A negative result does not preclude SARS-CoV-2 infection and should not be used as the sole basis for treatment or other patient management decisions.  A negative result may occur with improper specimen collection / handling, submission of specimen other than  nasopharyngeal swab, presence of viral mutation(s) within the areas targeted by this assay, and inadequate number of viral copies (<250 copies / mL). A negative result must be combined with clinical observations, patient history, and epidemiological information.  Fact Sheet for Patients:   StrictlyIdeas.no  Fact Sheet for Healthcare Providers: BankingDealers.co.za  This test is not yet approved or  cleared by the [place] FDA and has been authorized for detection and/or diagnosis of SARS-CoV-2 by FDA under an Emergency Use Authorization (EUA).  This EUA will remain in effect (meaning this test can be used) for the duration of  the COVID-19 declaration under Section 564(b)(1) of the Act, 21 U.S.C. section 360bbb-3(b)(1), unless the authorization is terminated or revoked sooner.  Performed at [facility], [address] [place], [place] [address]   Basic metabolic panel     Status: Abnormal   Collection Time: 12/10/20  5:42 AM  Result Value Ref Range   Sodium 139 135 - 145 mmol/L   Potassium 4.2 3.5 - 5.1 mmol/L   Chloride 102 98 - 111 mmol/L   CO2 29 22 - 32 mmol/L   Glucose, Bld 104 (H) 70 - 99 mg/dL    Comment: Glucose reference range applies only to samples taken after fasting for at least 8 hours.   BUN 16 8 - 23 mg/dL   Creatinine, Ser 1.38 (H) 0.61 - 1.24 mg/dL   Calcium 9.1 8.9 - 10.3 mg/dL   GFR, Estimated 48 (L) >60 mL/min    Comment: (NOTE) Calculated using the CKD-EPI Creatinine Equation (2021)    Anion gap 8 5 - 15    Comment: Performed at [facility], [address] [place], [place] [address]  CBC with Differential     Status: Abnormal   Collection Time: 01/10/21 11:40 AM  Result Value Ref Range   WBC 9.0 4.0 - 10.5 K/uL   RBC 3.91 (L) 4.22 - 5.81 MIL/uL   Hemoglobin 13.3 13.0 - 17.0 g/dL   HCT 38.6 (L) 39.0 - 52.0 %   MCV 98.7 80.0 - 100.0 fL   MCH 34.0 26.0 - 34.0 pg   MCHC 34.5  30.0 - 36.0 g/dL   RDW 14.1 11.5 - 15.5 %   Platelets 244 150 - 400 K/uL   nRBC 0.0 0.0 - 0.2 %   Neutrophils Relative % 76 %   Neutro Abs 6.8 1.7 - 7.7 K/uL   Lymphocytes Relative 12 %   Lymphs Abs 1.1 0.7 - 4.0 K/uL   Monocytes Relative 10 %   Monocytes Absolute 0.9 0.1 - 1.0 K/uL   Eosinophils Relative 2 %   Eosinophils Absolute 0.1 0.0 - 0.5 K/uL   Basophils Relative 0 %   Basophils Absolute 0.0 0.0 - 0.1 K/uL   Immature Granulocytes 0 %   Abs Immature Granulocytes 0.04 0.00 - 0.07 K/uL    Comment: Performed at [facility], [address] [place],  [address]  Comprehensive metabolic panel     Status: Abnormal   Collection Time: 01/10/21 11:40 AM  Result Value Ref Range   Sodium 138 135 - 145 mmol/L   Potassium 4.1 3.5 - 5.1 mmol/L   Chloride 103 98 - 111 mmol/L   CO2 26 22 - 32 mmol/L   Glucose, Bld 105 (H) 70 - 99 mg/dL    Comment: Glucose reference range applies only to samples taken after fasting for at least 8 hours.   BUN 13 8 - 23 mg/dL   Creatinine, Ser 1.11 0.61 - 1.24 mg/dL   Calcium 8.9 8.9 - 10.3 mg/dL   Total Protein 6.9 6.5 - 8.1 g/dL   Albumin 3.3 (L) 3.5 - 5.0 g/dL   AST 19 15 - 41 U/L   ALT 14 0 - 44 U/L   Alkaline Phosphatase 66 38 - 126 U/L   Total Bilirubin 1.0 0.3 - 1.2 mg/dL   GFR, Estimated >60 >60 mL/min    Comment: (NOTE) Calculated using the CKD-EPI Creatinine Equation (2021)    Anion gap 9 5 - 15    Comment: Performed at [facility],  [place], [place] [address]  Brain natriuretic peptide     Status: Abnormal   Collection Time: 01/10/21 11:40 AM  Result Value Ref Range   B Natriuretic Peptide 185.2 (H) 0.0 - 100.0 pg/mL    Comment: Performed at [facility], [address] [place], [place] [address]  Protime-INR     Status: None   Collection Time: 01/10/21 11:40 AM  Result Value Ref Range   Prothrombin Time 14.3 11.4 - 15.2 seconds   INR 1.1 0.8 - 1.2    Comment: (NOTE) INR goal  varies based on device and disease states. Performed at [facility], [address] [place], [place] [address]   APTT     Status: None   Collection Time: 01/10/21 11:40 AM  Result Value Ref Range   aPTT 33 24 - 36 seconds    Comment: Performed at [facility], [place], [place] [address]  Troponin I (High Sensitivity)     Status: None   Collection Time: 01/10/21 11:40 AM  Result Value Ref Range   Troponin I (High Sensitivity) 11 <18 ng/L    Comment: (NOTE) Elevated high sensitivity troponin I (hsTnI) values and significant  changes across serial measurements may suggest ACS but many other  chronic and acute conditions are known to elevate hsTnI results.  Refer to the "Links" section for chest pain algorithms and additional  guidance. Performed at [facility], [address] [place], [place] [address]   Urinalysis, Routine w reflex microscopic     Status: Abnormal   Collection Time: 01/10/21  2:05 PM  Result Value Ref Range   Color, Urine STRAW (A) YELLOW   APPearance CLEAR (A) CLEAR   Specific Gravity, Urine 1.014 1.005 - 1.030   pH 7.0 5.0 - 8.0   Glucose, UA NEGATIVE NEGATIVE mg/dL   Hgb urine dipstick NEGATIVE NEGATIVE   Bilirubin Urine NEGATIVE NEGATIVE   Ketones, ur NEGATIVE NEGATIVE mg/dL   Protein, ur NEGATIVE NEGATIVE mg/dL   Nitrite NEGATIVE NEGATIVE   Leukocytes,Ua NEGATIVE NEGATIVE    Comment: Performed at [facility], [address] [place], [place] [address]  Resp Panel by RT-PCR (Flu A&B, Covid) Nasopharyngeal Swab     Status: None   Collection Time: 01/10/21  7:30 PM   Specimen: Nasopharyngeal Swab; Nasopharyngeal(NP) swabs in vial transport medium  Result Value Ref Range   SARS Coronavirus 2 by RT PCR NEGATIVE NEGATIVE    Comment: (NOTE) SARS-CoV-2 target nucleic acids are NOT DETECTED.  The SARS-CoV-2 RNA is generally detectable in upper respiratory specimens during the acute phase of  infection. The lowest concentration of SARS-CoV-2 viral copies this assay can detect is 138 copies/mL. A negative result does not preclude SARS-Cov-2 infection and should not be used as the sole basis for treatment or other patient management decisions. A negative result may occur with  improper specimen collection/handling, submission of specimen other than nasopharyngeal swab, presence of viral mutation(s) within the areas targeted by this assay, and inadequate number of viral copies(<138 copies/mL). A negative result must be combined with clinical observations, patient history, and epidemiological information. The expected result is Negative.  Fact Sheet for Patients:  EntrepreneurPulse.com.au  Fact Sheet for Healthcare Providers:  IncredibleEmployment.be  This test is no t yet approved or cleared by the [place] FDA and  has been authorized for detection and/or diagnosis of SARS-CoV-2 by FDA under an Emergency Use Authorization (EUA). This EUA will remain  in  effect (meaning this test can be used) for the duration of the COVID-19 declaration under Section 564(b)(1) of the Act, 21 U.S.C.section 360bbb-3(b)(1), unless the authorization is terminated  or revoked sooner.       Influenza A by PCR NEGATIVE NEGATIVE   Influenza B by PCR NEGATIVE NEGATIVE    Comment: (NOTE) The Xpert Xpress SARS-CoV-2/FLU/RSV plus assay is intended as an aid in the diagnosis of influenza from Nasopharyngeal swab specimens and should not be used as a sole basis for treatment. Nasal washings and aspirates are unacceptable for Xpert Xpress SARS-CoV-2/FLU/RSV testing.  Fact Sheet for Patients: EntrepreneurPulse.com.au  Fact Sheet for Healthcare Providers: IncredibleEmployment.be  This test is not yet approved or cleared by the [place] FDA and has been authorized for detection and/or diagnosis of SARS-CoV-2 by FDA  under an Emergency Use Authorization (EUA). This EUA will remain in effect (meaning this test can be used) for the duration of the COVID-19 declaration under Section 564(b)(1) of the Act, 21 U.S.C. section 360bbb-3(b)(1), unless the authorization is terminated or revoked.  Performed at [facility], [place], [place] [address]   Heparin level (unfractionated)     Status: Abnormal   Collection Time: 01/10/21 10:54 PM  Result Value Ref Range   Heparin Unfractionated 0.21 (L) 0.30 - 0.70 IU/mL    Comment: (NOTE) The clinical reportable range upper limit is being lowered to >1.10 to align with the FDA approved guidance for the current laboratory assay.  If heparin results are below expected values, and patient dosage has  been confirmed, suggest follow up testing of antithrombin III levels. Performed at [facility], [address] [place], [place] [address]   Basic metabolic panel     Status: Abnormal   Collection Time: 01/11/21  5:28 AM  Result Value Ref Range   Sodium 136 135 - 145 mmol/L   Potassium 3.7 3.5 - 5.1 mmol/L   Chloride 104 98 - 111 mmol/L   CO2 26 22 - 32 mmol/L   Glucose, Bld 126 (H) 70 - 99 mg/dL    Comment: Glucose reference range applies only to samples taken after fasting for at least 8 hours.   BUN 13 8 - 23 mg/dL   Creatinine, Ser 1.23 0.61 - 1.24 mg/dL   Calcium 8.7 (L) 8.9 - 10.3 mg/dL   GFR, Estimated 55 (L) >60 mL/min    Comment: (NOTE) Calculated using the CKD-EPI Creatinine Equation (2021)    Anion gap 6 5 - 15    Comment: Performed at [facility], [address] [place], [place] [address]  CBC     Status: Abnormal   Collection Time: 01/11/21  5:28 AM  Result Value Ref Range   WBC 8.9 4.0 - 10.5 K/uL   RBC 3.73 (L) 4.22 - 5.81 MIL/uL   Hemoglobin 12.3 (L) 13.0 - 17.0 g/dL   HCT 36.8 (L) 39.0 - 52.0 %   MCV 98.7 80.0 - 100.0 fL   MCH 33.0 26.0 - 34.0 pg   MCHC 33.4 30.0 - 36.0 g/dL   RDW 14.4  11.5 - 15.5 %   Platelets 269 150 - 400 K/uL   nRBC 0.0 0.0 - 0.2 %    Comment: Performed at [facility], [address] [place], [place] [address]  Heparin level (unfractionated)     Status: None   Collection Time: 01/11/21  9:22 AM  Result Value Ref Range   Heparin Unfractionated 0.31 0.30 - 0.70 IU/mL  Comment: (NOTE) The clinical reportable range upper limit is being lowered to >1.10 to align with the FDA approved guidance for the current laboratory assay.  If heparin results are below expected values, and patient dosage has  been confirmed, suggest follow up testing of antithrombin III levels. Performed at [facility], [address] [place], [place] [address]   ECHOCARDIOGRAM COMPLETE     Status: None   Collection Time: 01/11/21 10:56 AM  Result Value Ref Range   Weight 2,368 oz   Height 65 in   BP 109/59 mmHg   Ao pk vel 2.64 m/s   AV Area VTI 1.61 cm2   AR max vel 1.42 cm2   AV Mean grad 14.0 mmHg   AV Peak grad 27.9 mmHg   S' Lateral 2.61 cm   AV Area mean vel 1.49 cm2  Heparin level (unfractionated)     Status: Abnormal   Collection Time: 01/11/21  5:58 PM  Result Value Ref Range   Heparin Unfractionated 0.24 (L) 0.30 - 0.70 IU/mL    Comment: (NOTE) The clinical reportable range upper limit is being lowered to >1.10 to align with the FDA approved guidance for the current laboratory assay.  If heparin results are below expected values, and patient dosage has  been confirmed, suggest follow up testing of antithrombin III levels. Performed at [facility], [address] [place], [place] [address]   CBC     Status: Abnormal   Collection Time: 01/12/21  2:38 AM  Result Value Ref Range   WBC 9.2 4.0 - 10.5 K/uL   RBC 3.58 (L) 4.22 - 5.81 MIL/uL   Hemoglobin 11.9 (L) 13.0 - 17.0 g/dL   HCT 34.7 (L) 39.0 - 52.0 %   MCV 96.9 80.0 - 100.0 fL   MCH 33.2 26.0 - 34.0 pg   MCHC 34.3 30.0 - 36.0 g/dL   RDW 13.8 11.5 - 15.5 %    Platelets 285 150 - 400 K/uL   nRBC 0.0 0.0 - 0.2 %    Comment: Performed at [facility], [place], [place] [address]  Heparin level (unfractionated)     Status: None   Collection Time: 01/12/21  2:38 AM  Result Value Ref Range   Heparin Unfractionated 0.41 0.30 - 0.70 IU/mL    Comment: (NOTE) The clinical reportable range upper limit is being lowered to >1.10 to align with the FDA approved guidance for the current laboratory assay.  If heparin results are below expected values, and patient dosage has  been confirmed, suggest follow up testing of antithrombin III levels. Performed at [facility], [place], [place] [address]   Heparin level (unfractionated)     Status: None   Collection Time: 01/12/21 10:55 AM  Result Value Ref Range   Heparin Unfractionated 0.48 0.30 - 0.70 IU/mL    Comment: (NOTE) The clinical reportable range upper limit is being lowered to >1.10 to align with the FDA approved guidance for the current laboratory assay.  If heparin results are below expected values, and patient dosage has  been confirmed, suggest follow up testing of antithrombin III levels. Performed at [facility], [address] [place], [place] [address]   CBC     Status: Abnormal   Collection Time: 01/13/21  4:39 AM  Result Value Ref Range   WBC 7.6 4.0 - 10.5 K/uL   RBC 3.29 (L) 4.22 - 5.81 MIL/uL   Hemoglobin 11.0 (L) 13.0 -  17.0 g/dL   HCT 32.1 (L) 39.0 - 52.0 %   MCV 97.6 80.0 - 100.0 fL   MCH 33.4 26.0 - 34.0 pg   MCHC 34.3 30.0 - 36.0 g/dL   RDW 13.8 11.5 - 15.5 %   Platelets 307 150 - 400 K/uL   nRBC 0.0 0.0 - 0.2 %    Comment: Performed at [facility], [place], [place] [address]  Heparin level (unfractionated)     Status: Abnormal   Collection Time: 01/13/21  4:39 AM  Result Value Ref Range   Heparin Unfractionated 0.20 (L) 0.30 - 0.70 IU/mL    Comment: (NOTE) The clinical  reportable range upper limit is being lowered to >1.10 to align with the FDA approved guidance for the current laboratory assay.  If heparin results are below expected values, and patient dosage has  been confirmed, suggest follow up testing of antithrombin III levels. Performed at [facility], [address] [place], [place] [address]   Basic metabolic panel     Status: Abnormal   Collection Time: 01/13/21  4:39 AM  Result Value Ref Range   Sodium 136 135 - 145 mmol/L   Potassium 3.8 3.5 - 5.1 mmol/L   Chloride 104 98 - 111 mmol/L   CO2 25 22 - 32 mmol/L   Glucose, Bld 115 (H) 70 - 99 mg/dL    Comment: Glucose reference range applies only to samples taken after fasting for at least 8 hours.   BUN 17 8 - 23 mg/dL   Creatinine, Ser 1.16 0.61 - 1.24 mg/dL   Calcium 8.4 (L) 8.9 - 10.3 mg/dL   GFR, Estimated 59 (L) >60 mL/min    Comment: (NOTE) Calculated using the CKD-EPI Creatinine Equation (2021)    Anion gap 7 5 - 15    Comment: Performed at [facility], [address] [place], [place] [address]  Magnesium     Status: None   Collection Time: 01/13/21  4:39 AM  Result Value Ref Range   Magnesium 2.3 1.7 - 2.4 mg/dL    Comment: Performed at [facility], [address] [place], [place] [address]  Uric acid     Status: None   Collection Time: 01/13/21  4:39 AM  Result Value Ref Range   Uric Acid, Serum 4.9 3.7 - 8.6 mg/dL    Comment: Performed at [facility], [address], [place], [place] [address]    Radiology DG Knee 1-2 Views Left  Result Date: 01/13/2021 CLINICAL DATA:  Left knee pain common no known injury, initial encounter EXAM: LEFT KNEE - 2 VIEW COMPARISON:  None. FINDINGS: Tricompartmental degenerative changes are noted. Small joint effusion is seen. Some calcific loose bodies are seen as well. No acute bony abnormality is noted. IMPRESSION: Degenerative change with joint effusion. No acute abnormality noted.  Electronically Signed   By: Inez Catalina M.D.   On: 01/13/2021 12:38   CT Angio Chest PE W and/or Wo Contrast  Result Date: 01/10/2021 CLINICAL DATA:  Lower extremity DVT. EXAM: CT ANGIOGRAPHY CHEST WITH CONTRAST TECHNIQUE: Multidetector CT imaging of the chest was performed using the standard protocol during bolus administration of intravenous contrast. Multiplanar CT image reconstructions and MIPs were obtained to evaluate the vascular anatomy. CONTRAST:  70mL OMNIPAQUE IOHEXOL 350 MG/ML SOLN COMPARISON:  CTA chest dated May 17, 2016. FINDINGS: Cardiovascular: Satisfactory opacification of the pulmonary arteries to the segmental level. Acute occlusive pulmonary emboli involving the right middle lobe  and left lower lobe lateral basal segment. More wall adherent near occlusive pulmonary emboli in the bilateral interlobar and lower lobe segmental pulmonary arteries. Mild right heart enlargement with elevated RV/LV ratio of 1.3. No pericardial effusion. Reflux of contrast into the IVC. Mediastinum/Nodes: No enlarged mediastinal, hilar, or axillary lymph nodes. Thyroid gland, trachea, and esophagus demonstrate no significant findings. Lungs/Pleura: Peripheral ground-glass density in the right middle lobe (series 6, image 51). Trace right pleural effusion. No pneumothorax. Mild subpleural cystic change and increased reticulation in both lungs, slightly progressed since 2018. Upper Abdomen: No acute abnormality. Musculoskeletal: No chest wall abnormality. No acute or significant osseous findings. Review of the MIP images confirms the above findings. IMPRESSION: 1. Acute on chronic occlusive and nonocclusive lobar and segmental pulmonary emboli involving the right middle lobe and both lower lobes. Positive for acute PE with CT evidence of right heart strain (RV/LV Ratio = 1.3) consistent with at least submassive (intermediate risk) PE. The presence of right heart strain has been associated with an increased risk of  morbidity and mortality. 2. Peripheral ground-glass density in the right middle lobe, concerning for pulmonary infarct. 3. Trace right pleural effusion. 4. Mild chronic interstitial lung disease, slightly progressed since 2018. Critical Value/emergent results were called by telephone at the time of interpretation on 01/10/2021 at 2:02 pm to provider [facility] , who verbally acknowledged these results. Electronically Signed   By: Titus Dubin M.D.   On: 01/10/2021 14:08   PERIPHERAL VASCULAR CATHETERIZATION  Result Date: 01/12/2021 See surgical note for result.  US Venous Img Lower Bilateral (DVT)  Result Date: 01/10/2021 CLINICAL DATA:  87 year old male with lower extremity edema EXAM: BILATERAL LOWER EXTREMITY VENOUS DOPPLER ULTRASOUND TECHNIQUE: Gray-scale sonography with graded compression, as well as color Doppler and duplex ultrasound were performed to evaluate the lower extremity deep venous systems from the level of the common femoral vein and including the common femoral, femoral, profunda femoral, popliteal and calf veins including the posterior tibial, peroneal and gastrocnemius veins when visible. The superficial great saphenous vein was also interrogated. Spectral Doppler was utilized to evaluate flow at rest and with distal augmentation maneuvers in the common femoral, femoral and popliteal veins. COMPARISON:  None. FINDINGS: RIGHT LOWER EXTREMITY Common Femoral Vein: The common femoral vein is only partially compressible. Wall adherent thrombus is visualized on color Doppler imaging. Positive flow present on color Doppler imaging. Saphenofemoral Junction: No evidence of thrombus. Normal compressibility and flow on color Doppler imaging. Profunda Femoral Vein: No evidence of thrombus. Normal compressibility and flow on color Doppler imaging. Femoral Vein: No evidence of thrombus. Normal compressibility, respiratory phasicity and response to augmentation. Popliteal Vein: No evidence of  thrombus. Normal compressibility, respiratory phasicity and response to augmentation. Calf Veins: No evidence of thrombus. Normal compressibility and flow on color Doppler imaging. Superficial Great Saphenous Vein: No evidence of thrombus. Normal compressibility. Venous Reflux:  None. Other Findings:  None. LEFT LOWER EXTREMITY Common Femoral Vein: No evidence of thrombus. Normal compressibility, respiratory phasicity and response to augmentation. Saphenofemoral Junction: No evidence of thrombus. Normal compressibility and flow on color Doppler imaging. Profunda Femoral Vein: No evidence of thrombus. Normal compressibility and flow on color Doppler imaging. Femoral Vein: No evidence of thrombus. Normal compressibility, respiratory phasicity and response to augmentation. Popliteal Vein: Only partially compressible. There is some eccentric wall thickening versus wall adherent thrombus in the proximal aspect of the popliteal vein. However, distally the vessel becomes completely occluded. Calf Veins: Occlusive thrombus extends into the posterior tibial  and peroneal veins. Superficial Great Saphenous Vein: No evidence of thrombus. Normal compressibility. Venous Reflux:  None. Other Findings:  None. IMPRESSION: 1. Positive for nonocclusive wall adherent DVT within the right common femoral vein. 2. Also positive for occlusive DVT in the left popliteal and calf veins. Electronically Signed   By: Jacqulynn Cadet M.D.   On: 01/10/2021 11:35   DG Chest Portable 1 View  Result Date: 01/10/2021 CLINICAL DATA:  right sided chest pain EXAM: PORTABLE CHEST 1 VIEW COMPARISON:  Same day CT. FINDINGS: The cardiomediastinal silhouette is unchanged in contour.Cardiac loop recorder. No pleural effusion. No pneumothorax. Hazy opacities of the RIGHT lateral lung. Visualized abdomen is unremarkable. Advanced degenerative changes of the bilateral shoulders. IMPRESSION: Hazy opacity of the RIGHT lateral lung, possibly reflecting  pulmonary infarction given history of pulmonary embolism. Additional differential considerations include infection or aspiration. Electronically Signed   By: Valentino Saxon M.D.   On: 01/10/2021 14:15   ECHOCARDIOGRAM COMPLETE  Result Date: 01/11/2021    ECHOCARDIOGRAM REPORT   Patient Name:   Matthew Clayton Date of Exam: 01/11/2021 Medical Rec #:  [id number]     Height:       65.0 in Accession #:    [id number]    Weight:       148.0 lb Date of Birth:  Apr 30, 1927     BSA:          1.741 m Patient Age:    49 years      BP:           98/58 mmHg Patient Gender: M             HR:           76 bpm. Exam Location:  [facility] Procedure: 2D Echo Indications:     Pulmonary Embolus  History:         Patient has no prior history of Echocardiogram examinations.                  Risk Factors:Hypertension and Dyslipidemia.  Sonographer:     L Thornton-Maynard Referring Phys:  [id number] AMY N COX Diagnosing Phys: Nelva Bush MD IMPRESSIONS  1. Left ventricular ejection fraction, by estimation, is 55 to 60%. The left ventricle has normal function. The left ventricle has no regional wall motion abnormalities. There is mild left ventricular hypertrophy. Left ventricular diastolic parameters are consistent with Grade II diastolic dysfunction (pseudonormalization).  2. Right ventricular systolic function is normal. The right ventricular size is mildly enlarged. There is normal pulmonary artery systolic pressure.  3. The mitral valve is grossly normal. Mild mitral valve regurgitation. No evidence of mitral stenosis.  4. The aortic valve has an indeterminant number of cusps. There is moderate calcification of the aortic valve. There is severe thickening of the aortic valve. Aortic valve regurgitation is not visualized. Mild aortic valve stenosis.  5. The inferior vena cava is dilated in size with >50% respiratory variability, suggesting right atrial pressure of 8 mmHg. FINDINGS  Left Ventricle: Left ventricular ejection fraction, by  estimation, is 55 to 60%. The left ventricle has normal function. The left ventricle has no regional wall motion abnormalities. The left ventricular internal cavity size was normal in size. There is  mild left ventricular hypertrophy. Left ventricular diastolic parameters are consistent with Grade II diastolic dysfunction (pseudonormalization). Right Ventricle: The right ventricular size is mildly enlarged. No increase in right ventricular wall thickness. Right ventricular systolic function is normal. There is normal pulmonary artery  systolic pressure. The tricuspid regurgitant velocity is 2.14  m/s, and with an assumed right atrial pressure of 8 mmHg, the estimated right ventricular systolic pressure is 78.5 mmHg. Left Atrium: Left atrial size was normal in size. Right Atrium: Right atrial size was not well visualized. Pericardium: There is no evidence of pericardial effusion. Mitral Valve: The mitral valve is grossly normal. Mild mitral valve regurgitation. No evidence of mitral valve stenosis. Tricuspid Valve: The tricuspid valve is not well visualized. Tricuspid valve regurgitation is trivial. Aortic Valve: The aortic valve has an indeterminant number of cusps. There is moderate calcification of the aortic valve. There is severe thickening of the aortic valve. Aortic valve regurgitation is not visualized. Mild aortic stenosis is present. Aortic valve mean gradient measures 14.0 mmHg. Aortic valve peak gradient measures 27.9 mmHg. Aortic valve area, by VTI measures 1.61 cm. Pulmonic Valve: The pulmonic valve was not well visualized. Pulmonic valve regurgitation is not visualized. No evidence of pulmonic stenosis. Aorta: The aortic root is normal in size and structure. Pulmonary Artery: The pulmonary artery is not well seen. Venous: The inferior vena cava is dilated in size with greater than 50% respiratory variability, suggesting right atrial pressure of 8 mmHg. IAS/Shunts: No atrial level shunt detected by  color flow Doppler.  LEFT VENTRICLE PLAX 2D LVIDd:         3.92 cm  Diastology LVIDs:         2.61 cm  LV e' medial:    7.07 cm/s LV PW:         1.35 cm  LV E/e' medial:  8.6 LV IVS:        1.22 cm  LV e' lateral:   8.05 cm/s LVOT diam:     2.10 cm  LV E/e' lateral: 7.6 LV SV:         79 LV SV Index:   46 LVOT Area:     3.46 cm  RIGHT VENTRICLE RV Basal diam:  4.42 cm RV S prime:     11.00 cm/s TAPSE (M-mode): 2.1 cm LEFT ATRIUM             Index LA diam:        2.40 cm 1.38 cm/m LA Vol (A2C):   38.8 ml 22.29 ml/m LA Vol (A4C):   26.0 ml 14.94 ml/m LA Biplane Vol: 33.2 ml 19.07 ml/m  AORTIC VALVE                    PULMONIC VALVE AV Area (Vmax):    1.42 cm     PV Vmax:       0.88 m/s AV Area (Vmean):   1.49 cm     PV Peak grad:  3.1 mmHg AV Area (VTI):     1.61 cm AV Vmax:           264.00 cm/s AV Vmean:          176.000 cm/s AV VTI:            0.494 m AV Peak Grad:      27.9 mmHg AV Mean Grad:      14.0 mmHg LVOT Vmax:         108.00 cm/s LVOT Vmean:        75.900 cm/s LVOT VTI:          0.229 m LVOT/AV VTI ratio: 0.46  AORTA Ao Root diam: 3.40 cm MV E velocity: 60.80 cm/s  TRICUSPID VALVE MV A velocity: 55.70  cm/s  TR Peak grad:   18.3 mmHg MV E/A ratio:  1.09        TR Vmax:        214.00 cm/s                             SHUNTS                            Systemic VTI:  0.23 m                            Systemic Diam: 2.10 cm Nelva Bush MD Electronically signed by Nelva Bush MD Signature Date/Time: 01/11/2021/3:03:01 PM    Final     Assessment/Plan  Embolism, pulmonary with infarction [facility]) The patient is doing much better after pulmonary thrombectomy about 2 weeks ago.  He seems to be at his baseline of breathing.  He has some chronic left leg swelling from his old DVT.  He is tolerating anticoagulation.  I would continue this likely indefinitely at this point as this was a second thrombotic event but clearly at least for 1 year.  Return to clinic in 6 months.  Deep venous thrombosis  (HCC) Likely the source of his pulmonary embolus.  Continue anticoagulation.  Essential hypertension blood pressure control important in reducing the progression of atherosclerotic disease. On appropriate oral medications.    Leotis Pain, MD  02/06/2021 1:07 PM    This note was created with Dragon medical transcription system.  Any errors from dictation are purely unintentional

## 2021-02-06 NOTE — Assessment & Plan Note (Signed)
blood pressure control important in reducing the progression of atherosclerotic disease. On appropriate oral medications.  

## 2021-02-06 NOTE — Assessment & Plan Note (Signed)
The patient is doing much better after pulmonary thrombectomy about 2 weeks ago.  He seems to be at his baseline of breathing.  He has some chronic left leg swelling from his old DVT.  He is tolerating anticoagulation.  I would continue this likely indefinitely at this point as this was a second thrombotic event but clearly at least for 1 year.  Return to clinic in 6 months.

## 2021-02-06 NOTE — Assessment & Plan Note (Signed)
Likely the source of his pulmonary embolus.  Continue anticoagulation.

## 2021-02-11 ENCOUNTER — Telehealth (INDEPENDENT_AMBULATORY_CARE_PROVIDER_SITE_OTHER): Payer: Self-pay

## 2021-02-11 NOTE — Telephone Encounter (Signed)
Yes, there are no contraindications

## 2021-02-11 NOTE — Telephone Encounter (Signed)
Patient spouse was made aware with medical recommendations

## 2021-03-11 ENCOUNTER — Telehealth (INDEPENDENT_AMBULATORY_CARE_PROVIDER_SITE_OTHER): Payer: Self-pay

## 2021-03-11 NOTE — Telephone Encounter (Signed)
That's fine. I usually recommend that for trips over 2-3 hours, they stop every 2 hours or so to get out and walk.

## 2021-03-11 NOTE — Telephone Encounter (Signed)
The  pts's wife called and left a Vm on the nurses line wanting to know was it ok for the pt to drive 6 hours total since he has recently have a procedure on 8/29 a Peripheral vascular cath. Please advise.

## 2021-03-12 NOTE — Telephone Encounter (Signed)
I called and spoke to the pt's wife and made her aware of the Np's instructions.

## 2021-03-24 ENCOUNTER — Other Ambulatory Visit: Payer: Self-pay

## 2021-03-24 ENCOUNTER — Encounter: Payer: Self-pay | Admitting: Family Medicine

## 2021-03-24 ENCOUNTER — Ambulatory Visit (INDEPENDENT_AMBULATORY_CARE_PROVIDER_SITE_OTHER): Payer: PPO | Admitting: Family Medicine

## 2021-03-24 VITALS — BP 98/60 | HR 76 | Temp 97.5°F | Resp 16 | Ht 66.0 in | Wt 150.0 lb

## 2021-03-24 DIAGNOSIS — I2699 Other pulmonary embolism without acute cor pulmonale: Secondary | ICD-10-CM | POA: Diagnosis not present

## 2021-03-24 DIAGNOSIS — L570 Actinic keratosis: Secondary | ICD-10-CM

## 2021-03-24 DIAGNOSIS — E782 Mixed hyperlipidemia: Secondary | ICD-10-CM

## 2021-03-24 DIAGNOSIS — Z86718 Personal history of other venous thrombosis and embolism: Secondary | ICD-10-CM

## 2021-03-24 DIAGNOSIS — I10 Essential (primary) hypertension: Secondary | ICD-10-CM

## 2021-03-24 DIAGNOSIS — H9193 Unspecified hearing loss, bilateral: Secondary | ICD-10-CM | POA: Diagnosis not present

## 2021-03-24 NOTE — Progress Notes (Signed)
I,[name],acting as a scribe for [name], MD.,have documented all relevant documentation on the behalf of [name], MD,as directed by  [name], MD while in the presence of [name], MD.   Established patient visit   Patient: Matthew Clayton   DOB: 1927/01/26   87 y.o. Male  MRN: [id number] Visit Date: 03/24/2021  Today's healthcare provider: [name], MD   Chief Complaint  Patient presents with   Follow-up   Hypertension   Subjective    HPI  She comes in today for follow-up.  He feels well.  He is accompanied by his wife.  He has 4 children and 10 grandchildren. Has an irritated actinic keratosis on his upper chest that he would like to have taken care of.  Catches on the collars of his shirts Hypertension, follow-up  BP Readings from Last 3 Encounters:  03/24/21 98/60  02/06/21 111/67  01/22/21 92/62   Wt Readings from Last 3 Encounters:  03/24/21 150 lb (68 kg)  02/06/21 151 lb (68.5 kg)  01/22/21 150 lb 1.6 oz (68.1 kg)     Patient is here for his 6 month follow up of his hypertension.  However, he has been seen several times since that time for acute visits.  Recently being seen by various providers due to Bradycardia-requiring placement of pacemaker and pulmonary embolism requiring pulmonary thrombectomy.  He reports good compliance with treatment. He is not having side effects. none He is following a Regular diet. He is not exercising. He does not smoke.  Use of agents associated with hypertension: none.   Outside blood pressures are not checking.  Pertinent labs: Lab Results  Component Value Date   CHOL 124 09/18/2020   HDL 47 09/18/2020   LDLCALC 44 09/18/2020   TRIG 208 (H) 09/18/2020   CHOLHDL 2.6 09/18/2020   Lab Results  Component Value Date   NA 136 01/13/2021   K 3.8 01/13/2021   CREATININE 1.16 01/13/2021   GFRNONAA 59 (L) 01/13/2021   GLUCOSE 115 (H) 01/13/2021   TSH 1.450  09/18/2020     The ASCVD Risk score (Arnett DK, et al., 2019) failed to calculate for the following reasons:   The 2019 ASCVD risk score is only valid for ages 50 to 59   ---------------------------------------------------------------------------------------------------     Medications: Outpatient Medications Prior to Visit  Medication Sig   apixaban (ELIQUIS) 5 MG TABS tablet Take 2 tablets (10 mg total) by mouth 2 (two) times daily. And from 01/20/2021 take 5 mg Twice a day   Multiple Vitamin (MULTIVITAMIN PO) Take by mouth daily.   simvastatin (ZOCOR) 40 MG tablet TAKE 1 TABLET (40 MG TOTAL) BY MOUTH DAILY.   [DISCONTINUED] diclofenac Sodium (VOLTAREN) 1 % GEL Apply 2 g topically 2 (two) times daily. (Patient not taking: Reported on 02/06/2021)   [DISCONTINUED] gabapentin (NEURONTIN) 100 MG capsule Take 1 capsule (100 mg total) by mouth as directed. Then taper down by 200 mg on every Sunday (weekly) and stop (Patient not taking: Reported on 02/06/2021)   No facility-administered medications prior to visit.    Review of Systems  Constitutional:  Negative for appetite change, chills and fever.  Respiratory:  Negative for chest tightness, shortness of breath and wheezing.   Cardiovascular:  Negative for chest pain and palpitations.  Gastrointestinal:  Negative for abdominal pain, nausea and vomiting.       Objective    BP 98/60 (BP Location: Left Arm,  Patient Position: Sitting, Cuff Size: Normal)   Pulse 76   Temp (!) 97.5 F (36.4 C) (Temporal)   Resp 16   Ht 5\' 6"  (1.676 m)   Wt 150 lb (68 kg)   SpO2 99%   BMI 24.21 kg/m  BP Readings from Last 3 Encounters:  03/24/21 98/60  02/06/21 111/67  01/22/21 92/62   Wt Readings from Last 3 Encounters:  03/24/21 150 lb (68 kg)  02/06/21 151 lb (68.5 kg)  01/22/21 150 lb 1.6 oz (68.1 kg)      Physical Exam Vitals reviewed.  Constitutional:      Appearance: Normal appearance. He is well-developed.  HENT:     Head:  Normocephalic and atraumatic.     Right Ear: Tympanic membrane and external ear normal.     Left Ear: Tympanic membrane and external ear normal.     Nose: Nose normal.     Mouth/Throat:     Mouth: Mucous membranes are moist.     Pharynx: Oropharynx is clear.  Eyes:     General: No scleral icterus.    Conjunctiva/sclera: Conjunctivae normal.  Neck:     Thyroid: No thyromegaly.     Vascular: No carotid bruit.  Cardiovascular:     Rate and Rhythm: Normal rate and regular rhythm.     Heart sounds: Normal heart sounds.  Pulmonary:     Effort: Pulmonary effort is normal.     Breath sounds: Normal breath sounds.  Abdominal:     Palpations: Abdomen is soft.  Lymphadenopathy:     Cervical: No cervical adenopathy.  Skin:    General: Skin is warm and dry.  Neurological:     General: No focal deficit present.     Mental Status: He is alert and oriented to person, place, and time.  Psychiatric:        Mood and Affect: Mood normal.        Behavior: Behavior normal.        Thought Content: Thought content normal.        Judgment: Judgment normal.      No results found for any visits on 03/24/21.  Assessment & Plan     1. Essential hypertension Hypotensive today on no medications. He is asymptomatic so would not treat his hypotension.  2. Embolism, pulmonary with infarction [facility]) Request for life after 2 DVTs and PEs now  3. Actinic keratosis Frozen with cryo pen.  4. Mixed hyperlipidemia On simvastatin 40  5. Decreased hearing of both ears   6. History of DVT (deep vein thrombosis) Followed by vascular    Return in about 6 months (around 09/21/2021) for CPE.      I, [name], MD, have reviewed all documentation for this visit. The documentation on 03/28/21 for the exam, diagnosis, procedures, and orders are all accurate and complete.    Floriene Jeschke Cranford Mon, MD  [facility] [phone] (phone) [phone] (fax)  [place]

## 2021-03-31 DIAGNOSIS — I495 Sick sinus syndrome: Secondary | ICD-10-CM | POA: Diagnosis not present

## 2021-04-22 DIAGNOSIS — I2699 Other pulmonary embolism without acute cor pulmonale: Secondary | ICD-10-CM | POA: Diagnosis not present

## 2021-04-22 DIAGNOSIS — I10 Essential (primary) hypertension: Secondary | ICD-10-CM | POA: Diagnosis not present

## 2021-04-22 DIAGNOSIS — I495 Sick sinus syndrome: Secondary | ICD-10-CM | POA: Diagnosis not present

## 2021-04-22 DIAGNOSIS — R001 Bradycardia, unspecified: Secondary | ICD-10-CM | POA: Diagnosis not present

## 2021-04-22 DIAGNOSIS — I499 Cardiac arrhythmia, unspecified: Secondary | ICD-10-CM | POA: Diagnosis not present

## 2021-04-22 DIAGNOSIS — I358 Other nonrheumatic aortic valve disorders: Secondary | ICD-10-CM | POA: Diagnosis not present

## 2021-04-22 DIAGNOSIS — I82593 Chronic embolism and thrombosis of other specified deep vein of lower extremity, bilateral: Secondary | ICD-10-CM | POA: Diagnosis not present

## 2021-04-22 DIAGNOSIS — Z86718 Personal history of other venous thrombosis and embolism: Secondary | ICD-10-CM | POA: Diagnosis not present

## 2021-05-26 ENCOUNTER — Other Ambulatory Visit: Payer: Self-pay | Admitting: Family Medicine

## 2021-05-26 NOTE — Telephone Encounter (Signed)
Requested Prescriptions  Pending Prescriptions Disp Refills   ELIQUIS 5 MG TABS tablet [Pharmacy Med Name: ELIQUIS 5 MG TABLET] 60 tablet 3    Sig: TAKE 2 TABLETS (10 MG TOTAL) BY MOUTH 2 (TWO) TIMES DAILY. AND FROM 01/20/2021 TAKE 5 MG TWICE A DAY     Hematology:  Anticoagulants Failed - 05/26/2021  1:32 AM      Failed - HGB in normal range and within 360 days    Hemoglobin  Date Value Ref Range Status  01/13/2021 11.0 (L) 13.0 - 17.0 g/dL Final  09/18/2020 13.1 13.0 - 17.7 g/dL Final         Failed - HCT in normal range and within 360 days    HCT  Date Value Ref Range Status  01/13/2021 32.1 (L) 39.0 - 52.0 % Final   Hematocrit  Date Value Ref Range Status  09/18/2020 39.4 37.5 - 51.0 % Final         Passed - PLT in normal range and within 360 days    Platelets  Date Value Ref Range Status  01/13/2021 307 150 - 400 K/uL Final  09/18/2020 209 150 - 450 x10E3/uL Final         Passed - Cr in normal range and within 360 days    Creatinine, Ser  Date Value Ref Range Status  01/13/2021 1.16 0.61 - 1.24 mg/dL Final         Passed - Valid encounter within last 12 months    Recent Outpatient Visits          2 months ago Essential hypertension   [facility] Jerrol Banana., MD   4 months ago Embolism, pulmonary with infarction [facility])   [facility] [address] Jerrol Banana., MD   5 months ago Sore throat   [facility] Chrismon, Vickki Muff, PA-C   8 months ago Annual physical exam   [facility] Jerrol Banana., MD   1 year ago Essential hypertension   [facility] Jerrol Banana., MD      Future Appointments            In 4 months Jerrol Banana., MD [facility] [place]

## 2021-06-30 ENCOUNTER — Other Ambulatory Visit: Payer: Self-pay

## 2021-06-30 ENCOUNTER — Ambulatory Visit (INDEPENDENT_AMBULATORY_CARE_PROVIDER_SITE_OTHER): Payer: PPO | Admitting: Physician Assistant

## 2021-06-30 ENCOUNTER — Ambulatory Visit (INDEPENDENT_AMBULATORY_CARE_PROVIDER_SITE_OTHER): Payer: PPO

## 2021-06-30 VITALS — BP 118/70 | HR 65 | Temp 97.4°F | Wt 149.7 lb

## 2021-06-30 DIAGNOSIS — Z0000 Encounter for general adult medical examination without abnormal findings: Secondary | ICD-10-CM | POA: Diagnosis not present

## 2021-06-30 DIAGNOSIS — Z91199 Patient's noncompliance with other medical treatment and regimen due to unspecified reason: Secondary | ICD-10-CM

## 2021-06-30 NOTE — Patient Instructions (Signed)
Matthew Clayton , Thank you for taking time to come for your Medicare Wellness Visit. I appreciate your ongoing commitment to your health goals. Please review the following plan we discussed and let me know if I can assist you in the future.   Screening recommendations/referrals: Colonoscopy: aged out Recommended yearly ophthalmology/optometry visit for glaucoma screening and checkup Recommended yearly dental visit for hygiene and checkup  Vaccinations: Influenza vaccine: 02/28/21 Pneumococcal vaccine: 02/06/19 Tdap vaccine: 09/15/12 Shingles vaccine: n/d   Covid-19: 05/23/19, 06/13/19, 01/28/20, 08/15/20  Advanced directives: no  Conditions/risks identified: none  Next appointment: Follow up in one year for your annual wellness visit.   Preventive Care 39 Years and Older, Male Preventive care refers to lifestyle choices and visits with your health care provider that can promote health and wellness. What does preventive care include? A yearly physical exam. This is also called an annual well check. Dental exams once or twice a year. Routine eye exams. Ask your health care provider how often you should have your eyes checked. Personal lifestyle choices, including: Daily care of your teeth and gums. Regular physical activity. Eating a healthy diet. Avoiding tobacco and drug use. Limiting alcohol use. Practicing safe sex. Taking low doses of aspirin every day. Taking vitamin and mineral supplements as recommended by your health care provider. What happens during an annual well check? The services and screenings done by your health care provider during your annual well check will depend on your age, overall health, lifestyle risk factors, and family history of disease. Counseling  Your health care provider may ask you questions about your: Alcohol use. Tobacco use. Drug use. Emotional well-being. Home and relationship well-being. Sexual activity. Eating habits. History of falls. Memory  and ability to understand (cognition). Work and work Statistician. Screening  You may have the following tests or measurements: Height, weight, and BMI. Blood pressure. Lipid and cholesterol levels. These may be checked every 5 years, or more frequently if you are over 35 years old. Skin check. Lung cancer screening. You may have this screening every year starting at age 2 if you have a 30-pack-year history of smoking and currently smoke or have quit within the past 15 years. Fecal occult blood test (FOBT) of the stool. You may have this test every year starting at age 70. Flexible sigmoidoscopy or colonoscopy. You may have a sigmoidoscopy every 5 years or a colonoscopy every 10 years starting at age 8. Prostate cancer screening. Recommendations will vary depending on your family history and other risks. Hepatitis C blood test. Hepatitis B blood test. Sexually transmitted disease (STD) testing. Diabetes screening. This is done by checking your blood sugar (glucose) after you have not eaten for a while (fasting). You may have this done every 1-3 years. Abdominal aortic aneurysm (AAA) screening. You may need this if you are a current or former smoker. Osteoporosis. You may be screened starting at age 63 if you are at high risk. Talk with your health care provider about your test results, treatment options, and if necessary, the need for more tests. Vaccines  Your health care provider may recommend certain vaccines, such as: Influenza vaccine. This is recommended every year. Tetanus, diphtheria, and acellular pertussis (Tdap, Td) vaccine. You may need a Td booster every 10 years. Zoster vaccine. You may need this after age 68. Pneumococcal 13-valent conjugate (PCV13) vaccine. One dose is recommended after age 91. Pneumococcal polysaccharide (PPSV23) vaccine. One dose is recommended after age 20. Talk to your health care provider about which  screenings and vaccines you need and how often you  need them. This information is not intended to replace advice given to you by your health care provider. Make sure you discuss any questions you have with your health care provider. Document Released: 05/30/2015 Document Revised: 01/21/2016 Document Reviewed: 03/04/2015 Elsevier Interactive Patient Education  2017 [place] Prevention in the Home Falls can cause injuries. They can happen to people of all ages. There are many things you can do to make your home safe and to help prevent falls. What can I do on the outside of my home? Regularly fix the edges of walkways and driveways and fix any cracks. Remove anything that might make you trip as you walk through a door, such as a raised step or threshold. Trim any bushes or trees on the path to your home. Use bright outdoor lighting. Clear any walking paths of anything that might make someone trip, such as rocks or tools. Regularly check to see if handrails are loose or broken. Make sure that both sides of any steps have handrails. Any raised decks and porches should have guardrails on the edges. Have any leaves, snow, or ice cleared regularly. Use sand or salt on walking paths during winter. Clean up any spills in your garage right away. This includes oil or grease spills. What can I do in the bathroom? Use night lights. Install grab bars by the toilet and in the tub and shower. Do not use towel bars as grab bars. Use non-skid mats or decals in the tub or shower. If you need to sit down in the shower, use a plastic, non-slip stool. Keep the floor dry. Clean up any water that spills on the floor as soon as it happens. Remove soap buildup in the tub or shower regularly. Attach bath mats securely with double-sided non-slip rug tape. Do not have throw rugs and other things on the floor that can make you trip. What can I do in the bedroom? Use night lights. Make sure that you have a light by your bed that is easy to reach. Do not  use any sheets or blankets that are too big for your bed. They should not hang down onto the floor. Have a firm chair that has side arms. You can use this for support while you get dressed. Do not have throw rugs and other things on the floor that can make you trip. What can I do in the kitchen? Clean up any spills right away. Avoid walking on wet floors. Keep items that you use a lot in easy-to-reach places. If you need to reach something above you, use a strong step stool that has a grab bar. Keep electrical cords out of the way. Do not use floor polish or wax that makes floors slippery. If you must use wax, use non-skid floor wax. Do not have throw rugs and other things on the floor that can make you trip. What can I do with my stairs? Do not leave any items on the stairs. Make sure that there are handrails on both sides of the stairs and use them. Fix handrails that are broken or loose. Make sure that handrails are as long as the stairways. Check any carpeting to make sure that it is firmly attached to the stairs. Fix any carpet that is loose or worn. Avoid having throw rugs at the top or bottom of the stairs. If you do have throw rugs, attach them to the floor with carpet  tape. Make sure that you have a light switch at the top of the stairs and the bottom of the stairs. If you do not have them, ask someone to add them for you. What else can I do to help prevent falls? Wear shoes that: Do not have high heels. Have rubber bottoms. Are comfortable and fit you well. Are closed at the toe. Do not wear sandals. If you use a stepladder: Make sure that it is fully opened. Do not climb a closed stepladder. Make sure that both sides of the stepladder are locked into place. Ask someone to hold it for you, if possible. Clearly mark and make sure that you can see: Any grab bars or handrails. First and last steps. Where the edge of each step is. Use tools that help you move around (mobility  aids) if they are needed. These include: Canes. Walkers. Scooters. Crutches. Turn on the lights when you go into a dark area. Replace any light bulbs as soon as they burn out. Set up your furniture so you have a clear path. Avoid moving your furniture around. If any of your floors are uneven, fix them. If there are any pets around you, be aware of where they are. Review your medicines with your doctor. Some medicines can make you feel dizzy. This can increase your chance of falling. Ask your doctor what other things that you can do to help prevent falls. This information is not intended to replace advice given to you by your health care provider. Make sure you discuss any questions you have with your health care provider. Document Released: 02/27/2009 Document Revised: 10/09/2015 Document Reviewed: 06/07/2014 Elsevier Interactive Patient Education  2017 [employer].

## 2021-06-30 NOTE — Progress Notes (Signed)
°  ° ° °  Established patient visit   Patient: Matthew Clayton   DOB: 03/29/1927   88 y.o. Male  MRN: [id number] Visit Date: 06/30/2021  Today's healthcare provider: Mikey Kirschner, PA-C   No chief complaint on file.  Subjective    HPI  Patient was a no-show to today's appointment.  Medications: Outpatient Medications Prior to Visit  Medication Sig   ELIQUIS 5 MG TABS tablet TAKE 2 TABLETS (10 MG TOTAL) BY MOUTH 2 (TWO) TIMES DAILY. AND FROM 01/20/2021 TAKE 5 MG TWICE A DAY   Multiple Vitamin (MULTIVITAMIN PO) Take by mouth daily.   simvastatin (ZOCOR) 40 MG tablet Take 1 tablet by mouth daily.   No facility-administered medications prior to visit.    Review of Systems     Objective    There were no vitals taken for this visit. {Show previous vital signs (optional):23777}  Physical Exam    No results found for any visits on 06/30/21.  Assessment & Plan       No follow-ups on file.       Mikey Kirschner, PA-C  [facility] [phone] (phone) [phone] (fax)  [place]

## 2021-06-30 NOTE — Progress Notes (Signed)
Subjective:   Matthew Clayton is a 88 y.o. male who presents for Medicare Annual/Subsequent preventive examination.  Review of Systems           Objective:    Today's Vitals   06/30/21 1311  BP: 118/70  Pulse: 65  Temp: (!) 97.4 F (36.3 C)  TempSrc: Oral  SpO2: 97%  Weight: 149 lb 11.2 oz (67.9 kg)   Body mass index is 24.16 kg/m.  Advanced Directives 01/22/2021 01/10/2021 01/10/2021 12/09/2020 12/09/2020 06/26/2020 06/26/2019  Does Patient Have a Medical Advance Directive? No;Yes Yes Yes Yes Yes Yes Yes  Type of Paramedic of [place];Living will Living will;Healthcare Power of [place];Living will [place];Living will [place];Living will [place];Living will [place];Living will  Does patient want to make changes to medical advance directive? - Yes (Inpatient - patient defers changing a medical advance directive and declines information at this time) - No - Patient declined - - -  Copy of [place] in Chart? No - copy requested - No - copy requested No - copy requested - No - copy requested No - copy requested  Would patient like information on creating a medical advance directive? No - Patient declined - - - - - -    Current Medications (verified) Outpatient Encounter Medications as of 06/30/2021  Medication Sig   simvastatin (ZOCOR) 40 MG tablet Take 1 tablet by mouth daily.   ELIQUIS 5 MG TABS tablet TAKE 2 TABLETS (10 MG TOTAL) BY MOUTH 2 (TWO) TIMES DAILY. AND FROM 01/20/2021 TAKE 5 MG TWICE A DAY   Multiple Vitamin (MULTIVITAMIN PO) Take by mouth daily.   [DISCONTINUED] simvastatin (ZOCOR) 40 MG tablet TAKE 1 TABLET (40 MG TOTAL) BY MOUTH DAILY.   No facility-administered encounter medications on file as of 06/30/2021.    Allergies (verified) Patient has no known allergies.   History: Past Medical History:  Diagnosis Date    DVT (deep venous thrombosis) (HCC)    left leg   Hyperlipidemia    Past Surgical History:  Procedure Laterality Date   APPENDECTOMY     MOLE REMOVAL     PACEMAKER LEADLESS INSERTION N/A 12/09/2020   Procedure: PACEMAKER LEADLESS INSERTION;  Surgeon: Isaias Cowman, MD;  Location: [facility];  Service: Cardiovascular;  Laterality: N/A;   PULMONARY THROMBECTOMY Bilateral 01/12/2021   Procedure: PULMONARY THROMBECTOMY;  Surgeon: Algernon Huxley, MD;  Location: [facility];  Service: Cardiovascular;  Laterality: Bilateral;   TONSILLECTOMY AND ADENOIDECTOMY     TOTAL HIP ARTHROPLASTY Bilateral    Family History  Problem Relation Age of Onset   Breast cancer Mother    Cancer Mother        eye cancer   Heart disease Father    Breast cancer Sister    Stroke Brother    Heart attack Brother    Breast cancer Sister    Aneurysm Sister    Social History   Socioeconomic History   Marital status: Married    Spouse name: Matthew Clayton   Number of children: 4   Years of education: bachelors   Highest education level: Bachelor's degree (e.g., BA, AB, BS)  Occupational History   Occupation: Retired  Tobacco Use   Smoking status: Former    Packs/day: 1.50    Years: 20.00    Pack years: 30.00    Types: Cigarettes    Quit date:  05/16/1960    Years since quitting: 61.1   Smokeless tobacco: Never   Tobacco comments:    quit in 1968  Substance and Sexual Activity   Alcohol use: No    Alcohol/week: 0.0 standard drinks   Drug use: No   Sexual activity: Not on file  Other Topics Concern   Not on file  Social History Narrative   Not on file   Social Determinants of Health   Financial Resource Strain: Not on file  Food Insecurity: Not on file  Transportation Needs: Not on file  Physical Activity: Not on file  Stress: Not on file  Social Connections: Not on file    Tobacco Counseling Counseling given: Not Answered Tobacco comments: quit in 1968   Clinical  Intake:  Pre-visit preparation completed: Yes  Pain : No/denies pain     Nutritional Risks: None Diabetes: No  How often do you need to have someone help you when you read instructions, pamphlets, or other written materials from your doctor or pharmacy?: 1 - Never  Diabetic?no  Interpreter Needed?: No  Information entered by :: Matthew Shaggy, LPN   Activities of Daily Living In your present state of health, do you have any difficulty performing the following activities: 06/26/2021 03/24/2021  Hearing? N N  Vision? N N  Difficulty concentrating or making decisions? N N  Walking or climbing stairs? N N  Dressing or bathing? N N  Doing errands, shopping? N N  Preparing Food and eating ? N -  Using the Toilet? N -  In the past six months, have you accidently leaked urine? N -  Do you have problems with loss of bowel control? N -  Managing your Medications? N -  Managing your Finances? N -  Housekeeping or managing your Housekeeping? N -  Some recent data might be hidden    Patient Care Team: Matthew Clayton., MD as PCP - General (Family Medicine) Schnier, Dolores Lory, MD (Vascular Surgery) Matthew Clayton., MD (Ophthalmology)  Indicate any recent Medical Services you may have received from other than [facility] providers in the past year (date may be approximate).     Assessment:   This is a routine wellness examination for Matthew Clayton.  Hearing/Vision screen No results found.  Dietary issues and exercise activities discussed:     Goals Addressed   None    Depression Screen PHQ 2/9 Scores 03/24/2021 09/18/2020 06/26/2020 03/18/2020 10/12/2018 07/05/2018 06/01/2018  PHQ - 2 Score 0 0 0 0 0 0 0  PHQ- 9 Score 0 0 - 0 - - -    Fall Risk Fall Risk  06/26/2021 03/24/2021 09/18/2020 06/26/2020 03/18/2020  Falls in the past year? 0 0 0 1 0  Number falls in past yr: - 0 0 0 0  Injury with Fall? - 0 0 0 0  Risk for fall due to : - No Fall Risks - - -  Follow up - Falls evaluation  completed Falls evaluation completed Falls prevention discussed Falls evaluation completed    FALL RISK PREVENTION PERTAINING TO THE HOME:  Any stairs in or around the home? Yes  If so, are there any without handrails? No  Home free of loose throw rugs in walkways, pet beds, electrical cords, etc? Yes  Adequate lighting in your home to reduce risk of falls? Yes   ASSISTIVE DEVICES UTILIZED TO PREVENT FALLS:  Life alert? No  Use of a cane, walker or w/c? No  Grab bars in the bathroom?  Yes  Shower chair or bench in shower? No  Elevated toilet seat or a handicapped toilet? Yes   TIMED UP AND GO:  Was the test performed? Yes .  Length of time to ambulate 10 feet: 4 sec.   Gait steady and fast without use of assistive device  Cognitive Function:        Immunizations Immunization History  Administered Date(s) Administered   Fluad Quad(high Dose 65+) 02/28/2021   Hepatitis A, Adult 11/25/2015   Influenza, High Dose Seasonal PF 02/12/2016, 01/05/2017, 03/28/2018, 02/05/2019   Influenza-Unspecified 01/28/2020   PFIZER Comirnaty(Gray Top)Covid-19 Tri-Sucrose Vaccine 08/15/2020   PFIZER(Purple Top)SARS-COV-2 Vaccination 05/23/2019, 06/13/2019, 01/28/2020   Pneumococcal Conjugate-13 02/12/2016   Pneumococcal Polysaccharide-23 02/06/2019   Td 09/15/2012   Zoster, Live 05/23/2009    TDAP status: Up to date  Flu Vaccine status: Up to date  Pneumococcal vaccine status: Up to date  Covid-19 vaccine status: Completed vaccines  Qualifies for Shingles Vaccine? Yes   Zostavax completed No   Shingrix Completed?: No.    Education has been provided regarding the importance of this vaccine. Patient has been advised to call insurance company to determine out of pocket expense if they have not yet received this vaccine. Advised may also receive vaccine at local pharmacy or Health Dept. Verbalized acceptance and understanding.  Screening Tests Health Maintenance  Topic Date Due    Zoster Vaccines- Shingrix (1 of 2) Never done   COVID-19 Vaccine (5 - Booster for Pfizer series) 10/10/2020   TETANUS/TDAP  09/16/2022   Pneumonia Vaccine 2+ Years old  Completed   INFLUENZA VACCINE  Completed   HPV VACCINES  Aged Out    Health Maintenance  Health Maintenance Due  Topic Date Due   Zoster Vaccines- Shingrix (1 of 2) Never done   COVID-19 Vaccine (5 - Booster for Pfizer series) 10/10/2020    Colorectal cancer screening: No longer required.   Lung Cancer Screening: (Low Dose CT Chest recommended if Age 64-80 years, 30 pack-year currently smoking OR have quit w/in 15years.) does not qualify.   Additional Screening:  Hepatitis C Screening: does not qualify; Completed no  Vision Screening: Recommended annual ophthalmology exams for early detection of glaucoma and other disorders of the eye. Is the patient up to date with their annual eye exam?  Yes  Who is the provider or what is the name of the office in which the patient attends annual eye exams? Dr.Bell If pt is not established with a provider, would they like to be referred to a provider to establish care? No .   Dental Screening: Recommended annual dental exams for proper oral hygiene  Community Resource Referral / Chronic Care Management: CRR required this visit?  No   CCM required this visit?  No      Plan:     I have personally reviewed and noted the following in the patient's chart:   Medical and social history Use of alcohol, tobacco or illicit drugs  Current medications and supplements including opioid prescriptions. Patient is not currently taking opioid prescriptions. Functional ability and status Nutritional status Physical activity Advanced directives List of other physicians Hospitalizations, surgeries, and ER visits in previous 12 months Vitals Screenings to include cognitive, depression, and falls Referrals and appointments  In addition, I have reviewed and discussed with patient  certain preventive protocols, quality metrics, and best practice recommendations. A written personalized care plan for preventive services as well as general preventive health recommendations were provided to patient.  Dionisio David, LPN   [id number]   Nurse Notes: none

## 2021-08-07 ENCOUNTER — Other Ambulatory Visit: Payer: Self-pay

## 2021-08-07 ENCOUNTER — Ambulatory Visit (INDEPENDENT_AMBULATORY_CARE_PROVIDER_SITE_OTHER): Payer: PPO | Admitting: Vascular Surgery

## 2021-08-07 ENCOUNTER — Encounter (INDEPENDENT_AMBULATORY_CARE_PROVIDER_SITE_OTHER): Payer: Self-pay | Admitting: Vascular Surgery

## 2021-08-07 VITALS — BP 104/66 | HR 68 | Resp 16 | Ht 66.0 in | Wt 151.0 lb

## 2021-08-07 DIAGNOSIS — E782 Mixed hyperlipidemia: Secondary | ICD-10-CM | POA: Diagnosis not present

## 2021-08-07 DIAGNOSIS — I10 Essential (primary) hypertension: Secondary | ICD-10-CM | POA: Diagnosis not present

## 2021-08-07 DIAGNOSIS — I87009 Postthrombotic syndrome without complications of unspecified extremity: Secondary | ICD-10-CM | POA: Diagnosis not present

## 2021-08-07 DIAGNOSIS — I2699 Other pulmonary embolism without acute cor pulmonale: Secondary | ICD-10-CM | POA: Diagnosis not present

## 2021-08-07 NOTE — Assessment & Plan Note (Signed)
lipid control important in reducing the progression of atherosclerotic disease. Continue statin therapy  

## 2021-08-07 NOTE — Progress Notes (Signed)
? ? ?MRN : [id number] ? ?Matthew Clayton is a 88 y.o. ([birth year]) male who presents with chief complaint of  ?Chief Complaint  ?Patient presents with  ? Follow-up  ?  Check up  ?. ? 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  He is tolerating Eliquis 5 mg twice daily with no issues.  He is doing quite well with minimal complaints today.  He does have some chronic left lower extremity swelling but this is stable and not worsened.  He has had no ulceration or infection.  No chest pain or shortness of breath. ? ?Current Outpatient Medications  ?Medication Sig Dispense Refill  ? ELIQUIS 5 MG TABS tablet TAKE 2 TABLETS (10 MG TOTAL) BY MOUTH 2 (TWO) TIMES DAILY. AND FROM 01/20/2021 TAKE 5 MG TWICE A DAY 60 tablet 3  ? Multiple Vitamin (MULTIVITAMIN PO) Take by mouth daily.    ? simvastatin (ZOCOR) 40 MG tablet Take 1 tablet by mouth daily.    ? ?No current facility-administered medications for this visit.  ? ? ?Past Medical History:  ?Diagnosis Date  ? DVT (deep venous thrombosis) ([place])   ? left leg  ? Hyperlipidemia   ? ? ?Past Surgical History:  ?Procedure Laterality Date  ? APPENDECTOMY    ? MOLE REMOVAL    ? PACEMAKER LEADLESS INSERTION N/A 12/09/2020  ? Procedure: PACEMAKER LEADLESS INSERTION;  Surgeon: Isaias Cowman, MD;  Location: [facility];  Service: Cardiovascular;  Laterality: N/A;  ? PULMONARY THROMBECTOMY Bilateral 01/12/2021  ? Procedure: PULMONARY THROMBECTOMY;  Surgeon: Algernon Huxley, MD;  Location: [facility];  Service: Cardiovascular;  Laterality: Bilateral;  ? TONSILLECTOMY AND ADENOIDECTOMY    ? TOTAL HIP ARTHROPLASTY Bilateral   ? ? ? ?Social History  ? ?Tobacco Use  ? Smoking status: Former  ?  Packs/day: 1.50  ?  Years: 20.00  ?  Pack years: 30.00  ?  Types: Cigarettes  ?  Quit date: 05/16/1960  ?  Years since quitting: 61.2  ? Smokeless tobacco: Never  ? Tobacco comments:  ?  quit in 1968   ?Substance Use Topics  ? Alcohol use: No  ?  Alcohol/week: 0.0 standard drinks  ? Drug use: No  ? ? ? ? ?Family History  ?Problem Relation Age of Onset  ? Breast cancer Mother   ? Cancer Mother   ?     eye cancer  ? Heart disease Father   ? Breast cancer Sister   ? Stroke Brother   ? Heart attack Brother   ? Breast cancer Sister   ? Aneurysm Sister   ? ? ? ?No Known Allergies ? ?REVIEW OF SYSTEMS (Negative unless checked) ?  ?Constitutional: '[]'$ Weight loss  '[]'$ Fever  '[]'$ Chills ?Cardiac: '[]'$ Chest pain   '[]'$ Chest pressure   '[]'$ Palpitations   '[]'$ Shortness of breath when laying flat   '[]'$ Shortness of breath at rest   '[x]'$ Shortness of breath with exertion. ?Vascular:  '[]'$ Pain in legs with walking   '[]'$ Pain in legs at rest   '[]'$ Pain in legs when laying flat   '[]'$ Claudication   '[]'$ Pain in feet when walking  '[]'$ Pain in feet at rest  '[]'$ Pain in feet when laying flat   '[x]'$ History of DVT   '[x]'$ Phlebitis   '[x]'$ Swelling in legs   '[]'$ Varicose veins   '[]'$ Non-healing ulcers ?Pulmonary:   '[]'$ Uses home oxygen   '[]'$ Productive cough   '[]'$ Hemoptysis   '[]'$ Wheeze  '[]'$   COPD   '[]'$ Asthma ?Neurologic:  '[]'$ Dizziness  '[]'$ Blackouts   '[]'$ Seizures   '[]'$ History of stroke   '[]'$ History of TIA  '[]'$ Aphasia   '[]'$ Temporary blindness   '[]'$ Dysphagia   '[]'$ Weakness or numbness in arms   '[]'$ Weakness or numbness in legs ?Musculoskeletal:  '[]'$ Arthritis   '[]'$ Joint swelling   '[]'$ Joint pain   '[]'$ Low back pain ?Hematologic:  '[]'$ Easy bruising  '[]'$ Easy bleeding   '[]'$ Hypercoagulable state   '[]'$ Anemic   ?Gastrointestinal:  '[]'$ Blood in stool   '[]'$ Vomiting blood  '[]'$ Gastroesophageal reflux/heartburn   '[]'$ Abdominal pain ?Genitourinary:  '[]'$ Chronic kidney disease   '[]'$ Difficult urination  '[]'$ Frequent urination  '[]'$ Burning with urination   '[]'$ Hematuria ?Skin:  '[]'$ Rashes   '[]'$ Ulcers   '[]'$ Wounds ?Psychological:  '[]'$ History of anxiety   '[]'$  History of major depression. ? ? ?Physical Examination ? ?BP 104/66 (BP Location: Left Arm)   Pulse 68   Resp 16   Ht '5\' 6"'$  (1.676 m)   Wt 151 lb (68.5 kg)   BMI 24.37 kg/m?  ?Gen:  WD/WN,  NAD. Appears younger than stated age. ?Head: [place]/AT, No temporalis wasting. ?Ear/Nose/Throat: Hearing grossly intact, nares w/o erythema or drainage ?Eyes: Conjunctiva clear. Sclera non-icteric ?Neck: Supple.  Trachea midline ?Pulmonary:  Good air movement, no use of accessory muscles.  ?Cardiac: irregular ?Vascular:  ?Vessel Right Left  ?Radial Palpable Palpable  ?    ?    ? ?Musculoskeletal: M/S 5/5 throughout.  No deformity or atrophy. 1+ LLE edema. ?Neurologic: Sensation grossly intact in extremities.  Symmetrical.  Speech is fluent.  ?Psychiatric: Judgment intact, Mood & affect appropriate for pt's clinical situation. ?Dermatologic: No rashes or ulcers noted.  No cellulitis or open wounds. ? ? ? ? ? ?Labs ?No results found for this or any previous visit (from the past 2160 hour(s)). ? ?Radiology ?No results found. ? ?Assessment/Plan ?Deep venous thrombosis ([place]) ?Likely the source of his pulmonary embolus.  Continue anticoagulation. ?  ?Essential hypertension ?blood pressure control important in reducing the progression of atherosclerotic disease. On appropriate oral medications. ? ?Hyperlipidemia ?lipid control important in reducing the progression of atherosclerotic disease. Continue statin therapy ? ? ?Post-phlebitic syndrome ?Symptoms are mild and not that bothersome.  Wear compression socks as needed and elevate.  Recheck in one year. ? ? ? ?Leotis Pain, MD ? ?08/07/2021 ?12:12 PM ? ? ? ?This note was created with Dragon medical transcription system.  Any errors from dictation are purely unintentional  ?

## 2021-08-07 NOTE — Assessment & Plan Note (Signed)
Symptoms are mild and not that bothersome.  Wear compression socks as needed and elevate.  Recheck in one year. ?

## 2021-08-10 ENCOUNTER — Other Ambulatory Visit: Payer: Self-pay

## 2021-08-10 ENCOUNTER — Ambulatory Visit (INDEPENDENT_AMBULATORY_CARE_PROVIDER_SITE_OTHER): Payer: PPO | Admitting: Physician Assistant

## 2021-08-10 ENCOUNTER — Encounter: Payer: Self-pay | Admitting: Physician Assistant

## 2021-08-10 VITALS — BP 101/67 | HR 63 | Ht 66.0 in | Wt 151.1 lb

## 2021-08-10 DIAGNOSIS — L570 Actinic keratosis: Secondary | ICD-10-CM

## 2021-08-10 NOTE — Progress Notes (Signed)
?I,Sha'taria Tyson,acting as a Education administrator for [employer], PA-C.,have documented all relevant documentation on the behalf of Mikey Kirschner, PA-C,as directed by  Mikey Kirschner, PA-C while in the presence of Mikey Kirschner, PA-C. ? ?Acute Office Visit ? ?Subjective:  ? ? Patient ID: Matthew Clayton, male    DOB: [birth year], 88 y.o.   MRN: [id number] ? ?Cc. Lump on back ? ? ?HPI ?Patient is in today for a lump on his back. Located between shoulder blades on top of back bone. Reports being there at least a month, no pain associated and no discharge to report. Denies bleeding, itching. ? ?Past Medical History:  ?Diagnosis Date  ? DVT (deep venous thrombosis) ([place])   ? left leg  ? Hyperlipidemia   ? ? ?Past Surgical History:  ?Procedure Laterality Date  ? APPENDECTOMY    ? MOLE REMOVAL    ? PACEMAKER LEADLESS INSERTION N/A 12/09/2020  ? Procedure: PACEMAKER LEADLESS INSERTION;  Surgeon: Isaias Cowman, MD;  Location: [facility];  Service: Cardiovascular;  Laterality: N/A;  ? PULMONARY THROMBECTOMY Bilateral 01/12/2021  ? Procedure: PULMONARY THROMBECTOMY;  Surgeon: Algernon Huxley, MD;  Location: [facility];  Service: Cardiovascular;  Laterality: Bilateral;  ? TONSILLECTOMY AND ADENOIDECTOMY    ? TOTAL HIP ARTHROPLASTY Bilateral   ? ? ?Family History  ?Problem Relation Age of Onset  ? Breast cancer Mother   ? Cancer Mother   ?     eye cancer  ? Heart disease Father   ? Breast cancer Sister   ? Stroke Brother   ? Heart attack Brother   ? Breast cancer Sister   ? Aneurysm Sister   ? ? ?Social History  ? ?Socioeconomic History  ? Marital status: Married  ?  Spouse name: Gwinda Passe  ? Number of children: 4  ? Years of education: bachelors  ? Highest education level: Bachelor's degree (e.g., BA, AB, BS)  ?Occupational History  ? Occupation: Retired  ?Tobacco Use  ? Smoking status: Former  ?  Packs/day: 1.50  ?  Years: 20.00  ?  Pack years: 30.00  ?  Types: Cigarettes  ?  Quit date: 05/16/1960  ?  Years since  quitting: 61.2  ? Smokeless tobacco: Never  ? Tobacco comments:  ?  quit in 1968  ?Substance and Sexual Activity  ? Alcohol use: No  ?  Alcohol/week: 0.0 standard drinks  ? Drug use: No  ? Sexual activity: Not on file  ?Other Topics Concern  ? Not on file  ?Social History Narrative  ? Not on file  ? ?Social Determinants of Health  ? ?Financial Resource Strain: Low Risk   ? Difficulty of Paying Living Expenses: Not hard at all  ?Food Insecurity: No Food Insecurity  ? Worried About Charity fundraiser in the Last Year: Never true  ? Ran Out of Food in the Last Year: Never true  ?Transportation Needs: No Transportation Needs  ? Lack of Transportation (Medical): No  ? Lack of Transportation (Non-Medical): No  ?Physical Activity: Inactive  ? Days of Exercise per Week: 0 days  ? Minutes of Exercise per Session: 0 min  ?Stress: No Stress Concern Present  ? Feeling of Stress : Not at all  ?Social Connections: Moderately Integrated  ? Frequency of Communication with Friends and Family: Twice a week  ? Frequency of Social Gatherings with Friends and Family: More than three times a week  ? Attends Religious Services: More than 4 times per year  ?  Active Member of Clubs or Organizations: No  ? Attends Archivist Meetings: Never  ? Marital Status: Married  ?Intimate Partner Violence: Not At Risk  ? Fear of Current or Ex-Partner: No  ? Emotionally Abused: No  ? Physically Abused: No  ? Sexually Abused: No  ? ? ?Outpatient Medications Prior to Visit  ?Medication Sig Dispense Refill  ? ELIQUIS 5 MG TABS tablet TAKE 2 TABLETS (10 MG TOTAL) BY MOUTH 2 (TWO) TIMES DAILY. AND FROM 01/20/2021 TAKE 5 MG TWICE A DAY 60 tablet 3  ? Multiple Vitamin (MULTIVITAMIN PO) Take by mouth daily.    ? simvastatin (ZOCOR) 40 MG tablet Take 1 tablet by mouth daily.    ? ?No facility-administered medications prior to visit.  ? ? ?No Known Allergies ? ?Review of Systems  ?Constitutional:  Negative for fatigue and fever.  ?Respiratory:  Negative  for cough and shortness of breath.   ?Cardiovascular:  Negative for chest pain, palpitations and leg swelling.  ?Skin:  Positive for color change.  ?Neurological:  Negative for dizziness and headaches.  ? ?   ?Objective:  ?  ?Physical Exam ?Constitutional:   ?   General: He is awake.  ?   Appearance: He is well-developed.  ?HENT:  ?   Head: Normocephalic.  ?Eyes:  ?   Conjunctiva/sclera: Conjunctivae normal.  ?Cardiovascular:  ?   Rate and Rhythm: Normal rate.  ?Pulmonary:  ?   Effort: Pulmonary effort is normal.  ?Skin: ?   General: Skin is warm.  ?   Comments: Midline thoracic w/ a < 1 cm raised kerotic white lesion.   ?Neurological:  ?   Mental Status: He is alert and oriented to person, place, and time.  ?Psychiatric:     ?   Attention and Perception: Attention normal.     ?   Mood and Affect: Mood normal.     ?   Speech: Speech normal.     ?   Behavior: Behavior is cooperative.  ? ? ?There were no vitals taken for this visit. ?Wt Readings from Last 3 Encounters:  ?08/07/21 151 lb (68.5 kg)  ?06/30/21 149 lb 11.2 oz (67.9 kg)  ?03/24/21 150 lb (68 kg)  ? ? ?Health Maintenance Due  ?Topic Date Due  ? Zoster Vaccines- Shingrix (1 of 2) Never done  ? COVID-19 Vaccine (5 - Booster for Pfizer series) 10/10/2020  ? ? ?There are no preventive care reminders to display for this patient. ? ? ?Lab Results  ?Component Value Date  ? TSH 1.450 09/18/2020  ? ?Lab Results  ?Component Value Date  ? WBC 7.6 01/13/2021  ? HGB 11.0 (L) 01/13/2021  ? HCT 32.1 (L) 01/13/2021  ? MCV 97.6 01/13/2021  ? PLT 307 01/13/2021  ? ?Lab Results  ?Component Value Date  ? NA 136 01/13/2021  ? K 3.8 01/13/2021  ? CO2 25 01/13/2021  ? GLUCOSE 115 (H) 01/13/2021  ? BUN 17 01/13/2021  ? CREATININE 1.16 01/13/2021  ? BILITOT 1.0 01/10/2021  ? ALKPHOS 66 01/10/2021  ? AST 19 01/10/2021  ? ALT 14 01/10/2021  ? PROT 6.9 01/10/2021  ? ALBUMIN 3.3 (L) 01/10/2021  ? CALCIUM 8.4 (L) 01/13/2021  ? ANIONGAP 7 01/13/2021  ? EGFR 45 (L) 09/18/2020  ? ?Lab  Results  ?Component Value Date  ? CHOL 124 09/18/2020  ? ?Lab Results  ?Component Value Date  ? HDL 47 09/18/2020  ? ?Lab Results  ?Component Value Date  ? [place] 44  09/18/2020  ? ?Lab Results  ?Component Value Date  ? TRIG 208 (H) 09/18/2020  ? ?Lab Results  ?Component Value Date  ? CHOLHDL 2.6 09/18/2020  ? ?No results found for: HGBA1C ? ?   ?Assessment & Plan:  ? ?Actinic keratosis, back ?Pt reports he has several similar. ?Would prefer not to see Derm.  ?Ok to monitor.  ? ?I, Mikey Kirschner, PA-C have reviewed all documentation for this visit. The documentation on  08/10/2021 for the exam, diagnosis, procedures, and orders are all accurate and complete. ? ?Mikey Kirschner, PA-C ?[facility] ?[id number] ?[place], [place], [address] ?Office: [phone] ?Fax: [phone]  ? ?

## 2021-08-24 ENCOUNTER — Ambulatory Visit (INDEPENDENT_AMBULATORY_CARE_PROVIDER_SITE_OTHER): Payer: PPO | Admitting: Family Medicine

## 2021-08-24 DIAGNOSIS — U071 COVID-19: Secondary | ICD-10-CM

## 2021-08-24 MED ORDER — MOLNUPIRAVIR EUA 200MG CAPSULE: 4.0000 | ORAL_CAPSULE | Freq: Two times a day (BID) | ORAL | 0 refills | Status: AC

## 2021-08-24 NOTE — Progress Notes (Signed)
? ? ?  Virtual telephone visit ? ? ? ?Virtual Visit via Telephone Note  ? ?This visit type was conducted due to national recommendations for restrictions regarding the COVID-19 Pandemic (e.g. social distancing) in an effort to limit this patient's exposure and mitigate transmission in our community. Due to his co-morbid illnesses, this patient is at least at moderate risk for complications without adequate follow up. This format is felt to be most appropriate for this patient at this time. The patient did not have access to video technology or had technical difficulties with video requiring transitioning to audio format only (telephone). Physical exam was limited to content and character of the telephone converstion.   ? ?Patient location: home  ?Provider location: office  ? ?I discussed the limitations of evaluation and management by telemedicine and the availability of in person appointments. The patient expressed understanding and agreed to proceed.  ? ?Visit Date: 08/24/2021 ? ?Today's healthcare provider: Lelon Huh, MD  ? ?No chief complaint on file. ? ?Subjective  ?  ? ?Patient reports positive Covid this morning as well as wife.   ?Patient reports less energy than usual the last few weeks. Denies fever, cough, headache, shortness of breath, muscle weakness, Patient has sense of taste.   ?Reports SpO2 was 98% this morning.  ? ?States that his wife felt poorly yesterday so they both tested for covid last night, at which time he was positive, but she was not. His wife retested this morning and was positive and prescribed Paxlovid by her PCP.  ?  ? ?Medications: ?Outpatient Medications Prior to Visit  ?Medication Sig  ? ELIQUIS 5 MG TABS tablet TAKE 2 TABLETS (10 MG TOTAL) BY MOUTH 2 (TWO) TIMES DAILY. AND FROM 01/20/2021 TAKE 5 MG TWICE A DAY  ? Multiple Vitamin (MULTIVITAMIN PO) Take by mouth daily.  ? simvastatin (ZOCOR) 40 MG tablet Take 1 tablet by mouth daily.  ? ?No facility-administered medications prior  to visit.  ? ? ?Review of Systems ? ? ? ? Objective  ?  ?There were no vitals taken for this visit. ? ? ?Awake, alert, oriented x 3. In no apparent distress  ? ? Assessment & Plan  ?  ? ?1. COVID-19 ? ?- molnupiravir EUA (LAGEVRIO) 200 mg CAPS capsule; Take 4 capsules (800 mg total) by mouth 2 (two) times daily for 5 days.  Dispense: 40 capsule; Refill: 0  ? ?Call if symptoms change or if not rapidly improving.  ?  ?  ? ?I discussed the assessment and treatment plan with the patient. The patient was provided an opportunity to ask questions and all were answered. The patient agreed with the plan and demonstrated an understanding of the instructions. ?  ?The patient was advised to call back or seek an in-person evaluation if the symptoms worsen or if the condition fails to improve as anticipated. ? ?I provided 8 minutes of non-face-to-face time during this encounter. ? ?The entirety of the information documented in the History of Present Illness, Review of Systems and Physical Exam were personally obtained by me. Portions of this information were initially documented by the CMA and reviewed by me for thoroughness and accuracy.   ? ?Lelon Huh, MD ?[facility] ?[phone] (phone) ?[phone] (fax) ? ?[facility]  ?

## 2021-09-24 ENCOUNTER — Ambulatory Visit (INDEPENDENT_AMBULATORY_CARE_PROVIDER_SITE_OTHER): Payer: PPO | Admitting: Family Medicine

## 2021-09-24 ENCOUNTER — Encounter: Payer: Self-pay | Admitting: Family Medicine

## 2021-09-24 ENCOUNTER — Other Ambulatory Visit: Payer: Self-pay | Admitting: Family Medicine

## 2021-09-24 VITALS — BP 106/64 | HR 67 | Resp 16 | Ht 66.0 in | Wt 147.0 lb

## 2021-09-24 DIAGNOSIS — I82403 Acute embolism and thrombosis of unspecified deep veins of lower extremity, bilateral: Secondary | ICD-10-CM

## 2021-09-24 DIAGNOSIS — R200 Anesthesia of skin: Secondary | ICD-10-CM

## 2021-09-24 DIAGNOSIS — E782 Mixed hyperlipidemia: Secondary | ICD-10-CM | POA: Diagnosis not present

## 2021-09-24 DIAGNOSIS — Z86718 Personal history of other venous thrombosis and embolism: Secondary | ICD-10-CM

## 2021-09-24 DIAGNOSIS — H9193 Unspecified hearing loss, bilateral: Secondary | ICD-10-CM | POA: Diagnosis not present

## 2021-09-24 DIAGNOSIS — R202 Paresthesia of skin: Secondary | ICD-10-CM

## 2021-09-24 DIAGNOSIS — E7800 Pure hypercholesterolemia, unspecified: Secondary | ICD-10-CM | POA: Diagnosis not present

## 2021-09-24 DIAGNOSIS — Z0000 Encounter for general adult medical examination without abnormal findings: Secondary | ICD-10-CM

## 2021-09-24 DIAGNOSIS — G959 Disease of spinal cord, unspecified: Secondary | ICD-10-CM

## 2021-09-24 DIAGNOSIS — I2699 Other pulmonary embolism without acute cor pulmonale: Secondary | ICD-10-CM | POA: Diagnosis not present

## 2021-09-24 DIAGNOSIS — K602 Anal fissure, unspecified: Secondary | ICD-10-CM | POA: Diagnosis not present

## 2021-09-24 DIAGNOSIS — I2782 Chronic pulmonary embolism: Secondary | ICD-10-CM | POA: Diagnosis not present

## 2021-09-24 DIAGNOSIS — I10 Essential (primary) hypertension: Secondary | ICD-10-CM | POA: Diagnosis not present

## 2021-09-24 NOTE — Telephone Encounter (Signed)
Requested medication (s) are due for refill today:yes ? ?Requested medication (s) are on the active medication list: yes   ? ?Last refill: 05/26/21  #60   3  refills ? ?Future visit scheduled yes ? ?Notes to clinic:Pt has appt today 09/24/21  Please review. Thank you. ? ?Requested Prescriptions  ?Pending Prescriptions Disp Refills  ? ELIQUIS 5 MG TABS tablet [Pharmacy Med Name: ELIQUIS 5 MG TABLET] 60 tablet 3  ?  Sig: TAKE 2 TABLETS (10 MG TOTAL) BY MOUTH 2 (TWO) TIMES DAILY. AND FROM 01/20/2021 TAKE 5 MG TWICE A DAY  ?  ? Hematology:  Anticoagulants - apixaban Failed - 09/24/2021  2:25 AM  ?  ?  Failed - HGB in normal range and within 360 days  ?  Hemoglobin  ?Date Value Ref Range Status  ?01/13/2021 11.0 (L) 13.0 - 17.0 g/dL Final  ?09/18/2020 13.1 13.0 - 17.7 g/dL Final  ?   ?  ?  Failed - HCT in normal range and within 360 days  ?  HCT  ?Date Value Ref Range Status  ?01/13/2021 32.1 (L) 39.0 - 52.0 % Final  ? ?Hematocrit  ?Date Value Ref Range Status  ?09/18/2020 39.4 37.5 - 51.0 % Final  ?   ?  ?  Passed - PLT in normal range and within 360 days  ?  Platelets  ?Date Value Ref Range Status  ?01/13/2021 307 150 - 400 K/uL Final  ?09/18/2020 209 150 - 450 x10E3/uL Final  ?   ?  ?  Passed - Cr in normal range and within 360 days  ?  Creatinine, Ser  ?Date Value Ref Range Status  ?01/13/2021 1.16 0.61 - 1.24 mg/dL Final  ?   ?  ?  Passed - AST in normal range and within 360 days  ?  AST  ?Date Value Ref Range Status  ?01/10/2021 19 15 - 41 U/L Final  ?   ?  ?  Passed - ALT in normal range and within 360 days  ?  ALT  ?Date Value Ref Range Status  ?01/10/2021 14 0 - 44 U/L Final  ?   ?  ?  Passed - Valid encounter within last 12 months  ?  Recent Outpatient Visits   ? ?      ? 1 month ago COVID-19  ? [facility] Birdie Sons, MD  ? 1 month ago Actinic keratosis  ? [facility] Thedore Mins, Ria Comment, PA-C  ? 2 months ago No-show for appointment  ? [facility] Thedore Mins, Ria Comment, PA-C   ? 6 months ago Essential hypertension  ? [facility] Jerrol Banana., MD  ? 8 months ago Embolism, pulmonary with infarction [facility])  ? [facility] Jerrol Banana., MD  ? ?  ?  ?Future Appointments   ? ?        ? Today Jerrol Banana., MD [facility]  ? ?  ? ? ?  ?  ?  ? ? ? ? ?

## 2021-09-24 NOTE — Progress Notes (Signed)
Complete physical exam  I,[name],acting as a scribe for [name], MD.,have documented all relevant documentation on the behalf of [name], MD,as directed by  [name], MD while in the presence of [name], MD.   Patient: Matthew Clayton   DOB: 11-24-1926   88 y.o. Male  MRN: [id number] Visit Date: 09/24/2021  Today's healthcare provider: [name], MD   Chief Complaint  Patient presents with   Annual Exam   Subjective    Matthew Clayton is a 88 y.o. male who presents today for a complete physical exam.  He has been married for 80 years and he and his wife are doing well. He reports consuming a general diet. Home exercise routine includes walking. He generally feels well. He reports sleeping fairly well. He does not have additional problems to discuss today.  Overall he feels well.  He has numbness in his right fingertips on occasion.  He cannot abduct the fingers of his left hand.  He has no pain in his arms or hands. HPI  Patient had AWV with [facility] on 06/30/2021.  Past Medical History:  Diagnosis Date   DVT (deep venous thrombosis) (HCC)    left leg   Hyperlipidemia    Past Surgical History:  Procedure Laterality Date   APPENDECTOMY     MOLE REMOVAL     PACEMAKER LEADLESS INSERTION N/A 12/09/2020   Procedure: PACEMAKER LEADLESS INSERTION;  Surgeon: Isaias Cowman, MD;  Location: [facility];  Service: Cardiovascular;  Laterality: N/A;   PULMONARY THROMBECTOMY Bilateral 01/12/2021   Procedure: PULMONARY THROMBECTOMY;  Surgeon: Algernon Huxley, MD;  Location: [facility];  Service: Cardiovascular;  Laterality: Bilateral;   TONSILLECTOMY AND ADENOIDECTOMY     TOTAL HIP ARTHROPLASTY Bilateral    Social History   Socioeconomic History   Marital status: Married    Spouse name: Gwinda Passe   Number of children: 4   Years of education: bachelors   Highest education level: Bachelor's degree (e.g., BA, AB, BS)   Occupational History   Occupation: Retired  Tobacco Use   Smoking status: Former    Packs/day: 1.50    Years: 20.00    Pack years: 30.00    Types: Cigarettes    Quit date: 05/16/1960    Years since quitting: 61.4   Smokeless tobacco: Never   Tobacco comments:    quit in 1968  Substance and Sexual Activity   Alcohol use: No    Alcohol/week: 0.0 standard drinks   Drug use: No   Sexual activity: Not on file  Other Topics Concern   Not on file  Social History Narrative   Not on file   Social Determinants of Health   Financial Resource Strain: Low Risk    Difficulty of Paying Living Expenses: Not hard at all  Food Insecurity: No Food Insecurity   Worried About Charity fundraiser in the Last Year: Never true   [place] in the Last Year: Never true  Transportation Needs: No Transportation Needs   Lack of Transportation (Medical): No   Lack of Transportation (Non-Medical): No  Physical Activity: Inactive   Days of Exercise per Week: 0 days   Minutes of Exercise per Session: 0 min  Stress: No Stress Concern Present   Feeling of Stress : Not at all  Social Connections: Moderately Integrated   Frequency of Communication with Friends and Family: Twice a week  Frequency of Social Gatherings with Friends and Family: More than three times a week   Attends Religious Services: More than 4 times per year   Active Member of Clubs or Organizations: No   Attends Archivist Meetings: Never   Marital Status: Married  Human resources officer Violence: Not At Risk   Fear of Current or Ex-Partner: No   Emotionally Abused: No   Physically Abused: No   Sexually Abused: No   Family Status  Relation Name Status   Mother  Deceased at age 4   Father  Deceased at age 12   Sister  Deceased   Brother  Deceased   Sister  Deceased   Sister  Alive   MGM  Deceased   MGF  Deceased   [place]  Deceased   PGF  Deceased   Family History  Problem Relation Age of Onset   Breast cancer  Mother    Cancer Mother        eye cancer   Heart disease Father    Breast cancer Sister    Stroke Brother    Heart attack Brother    Breast cancer Sister    Aneurysm Sister    No Known Allergies  Patient Care Team: Jerrol Banana., MD as PCP - General (Family Medicine) Schnier, Dolores Lory, MD (Vascular Surgery) Lorelee Cover., MD (Ophthalmology)   Medications: Outpatient Medications Prior to Visit  Medication Sig   ELIQUIS 5 MG TABS tablet TAKE 2 TABLETS (10 MG TOTAL) BY MOUTH 2 (TWO) TIMES DAILY. AND FROM 01/20/2021 TAKE 5 MG TWICE A DAY   Multiple Vitamin (MULTIVITAMIN PO) Take by mouth daily.   simvastatin (ZOCOR) 40 MG tablet Take 1 tablet by mouth daily.   No facility-administered medications prior to visit.    Review of Systems  Constitutional:  Positive for fatigue.  HENT:  Positive for hearing loss.   Respiratory:  Positive for cough.   Musculoskeletal:  Positive for back pain and neck pain.  Skin:  Positive for rash.  Neurological:  Positive for weakness and numbness.  Hematological:  Bruises/bleeds easily.  All other systems reviewed and are negative.  Last thyroid functions Lab Results  Component Value Date   TSH 1.410 09/24/2021      Objective     BP 106/64 (BP Location: Right Arm, Patient Position: Sitting, Cuff Size: Normal)   Pulse 67   Resp 16   Ht '5\' 6"'$  (1.676 m)   Wt 147 lb (66.7 kg)   SpO2 97%   BMI 23.73 kg/m  BP Readings from Last 3 Encounters:  09/24/21 106/64  08/10/21 101/67  08/07/21 104/66   Wt Readings from Last 3 Encounters:  09/24/21 147 lb (66.7 kg)  08/10/21 151 lb 1.6 oz (68.5 kg)  08/07/21 151 lb (68.5 kg)       Physical Exam Vitals reviewed.  Constitutional:      Appearance: Normal appearance. He is well-developed.  HENT:     Head: Normocephalic and atraumatic.     Right Ear: Tympanic membrane and external ear normal.     Left Ear: Tympanic membrane and external ear normal.     Nose: Nose normal.      Mouth/Throat:     Mouth: Mucous membranes are moist.     Pharynx: Oropharynx is clear.  Eyes:     General: No scleral icterus.    Conjunctiva/sclera: Conjunctivae normal.  Neck:     Thyroid: No thyromegaly.     Vascular: No  carotid bruit.  Cardiovascular:     Rate and Rhythm: Normal rate and regular rhythm.     Heart sounds: Murmur heard.     Comments: 2 Over 6 systolic murmur Pulmonary:     Effort: Pulmonary effort is normal.     Breath sounds: Normal breath sounds.  Abdominal:     Palpations: Abdomen is soft.  Genitourinary:    Comments: This exam deferred at age 69 after discussion with patient. Musculoskeletal:     Comments: Arthritic changes of hands.  Especially notable in the MCPs.  Lymphadenopathy:     Cervical: No cervical adenopathy.  Skin:    General: Skin is warm and dry.  Neurological:     General: No focal deficit present.     Mental Status: He is alert and oriented to person, place, and time.     Comments: Tinel sign is negative.  He is unable to abduct his fingers on this left hand.  Psychiatric:        Mood and Affect: Mood normal.        Behavior: Behavior normal.        Thought Content: Thought content normal.        Judgment: Judgment normal.      Last depression screening scores    09/24/2021   10:40 AM 06/30/2021    1:14 PM 03/24/2021    1:50 PM  PHQ 2/9 Scores  PHQ - 2 Score 0 0 0  PHQ- 9 Score 1  0   Last fall risk screening    09/24/2021   10:40 AM  [place] in the past year? 0  Number falls in past yr: 0  Injury with Fall? 0  Risk for fall due to : No Fall Risks  Follow up Falls evaluation completed   Last Audit-C alcohol use screening    09/24/2021   10:40 AM  Alcohol Use Disorder Test (AUDIT)  1. How often do you have a drink containing alcohol? 0  2. How many drinks containing alcohol do you have on a typical day when you are drinking? 0  3. How often do you have six or more drinks on one occasion? 0  AUDIT-C Score 0    A score of 3 or more in women, and 4 or more in men indicates increased risk for alcohol abuse, EXCEPT if all of the points are from question 1   No results found for any visits on 09/24/21.  Assessment & Plan    Routine Health Maintenance and Physical Exam  Exercise Activities and Dietary recommendations  Goals      DIET - EAT MORE FRUITS AND VEGETABLES     Increase water intake     Recommend increasing water intake to 4 glasses of water a day.          Immunization History  Administered Date(s) Administered   Fluad Quad(high Dose 65+) 02/28/2021   Hepatitis A, Adult 11/25/2015   Influenza, High Dose Seasonal PF 02/12/2016, 01/05/2017, 03/28/2018, 02/05/2019   Influenza-Unspecified 01/28/2020   PFIZER Comirnaty(Gray Top)Covid-19 Tri-Sucrose Vaccine 08/15/2020   PFIZER(Purple Top)SARS-COV-2 Vaccination 05/23/2019, 06/13/2019, 01/28/2020   Pneumococcal Conjugate-13 02/12/2016   Pneumococcal Polysaccharide-23 02/06/2019   Td 09/15/2012   Zoster, Live 05/23/2009    Health Maintenance  Topic Date Due   Zoster Vaccines- Shingrix (1 of 2) Never done   COVID-19 Vaccine (5 - Booster for Pfizer series) 10/10/2020   INFLUENZA VACCINE  12/15/2021   TETANUS/TDAP  09/16/2022  Pneumonia Vaccine 19+ Years old  Completed   HPV VACCINES  Aged Out    Discussed health benefits of physical activity, and encouraged him to engage in regular exercise appropriate for his age and condition.  1. Annual physical exam  - Lipid panel - TSH - CBC w/Diff/Platelet - Comprehensive Metabolic Panel (CMET)  2. Essential hypertension Good blood pressure control - Lipid panel - TSH - CBC w/Diff/Platelet - Comprehensive Metabolic Panel (CMET)  3. Mixed hyperlipidemia Tolerating simvastatin for years - Lipid panel - TSH - CBC w/Diff/Platelet - Comprehensive Metabolic Panel (CMET)  4. History of DVT (deep vein thrombosis)  - Lipid panel - TSH - CBC w/Diff/Platelet - Comprehensive  Metabolic Panel (CMET)  5. Embolism, pulmonary with infarction (HCC)  - Lipid panel - TSH - CBC w/Diff/Platelet - Comprehensive Metabolic Panel (CMET)  6. Numbness and tingling in left hand Consider referral to hand surgeon for possible DVT - Vitamin B12 - Folate - AMB referral to orthopedics  7. Decreased hearing of both ears   8. Other chronic pulmonary embolism without acute cor pulmonale (HCC)   9. Recurrent acute deep vein thrombosis (DVT) of both lower extremities (HCC) Lifelong anticoagulation  10. Anal fissure   11. Pure hypercholesterolemia   12. Cervical myelopathy ([place]) Patient may have a cervical myelopathy or more distant neuropathy regarding both hands but specifically the left.  Also consider referral to hand surgeon.  Patient will think about   Return in about 6 months (around 03/27/2022).     I, [name], MD, have reviewed all documentation for this visit. The documentation on 10/01/21 for the exam, diagnosis, procedures, and orders are all accurate and complete.    Merriel Zinger [place] Mon, MD  [facility] [phone] (phone) [phone] (fax)  [place]

## 2021-09-25 LAB — CBC WITH DIFFERENTIAL/PLATELET
Basophils Absolute: 0.1 10*3/uL (ref 0.0–0.2)
Basos: 1 %
EOS (ABSOLUTE): 0.2 10*3/uL (ref 0.0–0.4)
Eos: 3 %
Hematocrit: 40.4 % (ref 37.5–51.0)
Hemoglobin: 13.4 g/dL (ref 13.0–17.7)
Immature Grans (Abs): 0 10*3/uL (ref 0.0–0.1)
Immature Granulocytes: 0 %
Lymphocytes Absolute: 1.6 10*3/uL (ref 0.7–3.1)
Lymphs: 21 %
MCH: 32.4 pg (ref 26.6–33.0)
MCHC: 33.2 g/dL (ref 31.5–35.7)
MCV: 98 fL — ABNORMAL HIGH (ref 79–97)
Monocytes Absolute: 0.7 10*3/uL (ref 0.1–0.9)
Monocytes: 9 %
Neutrophils Absolute: 5.1 10*3/uL (ref 1.4–7.0)
Neutrophils: 66 %
Platelets: 193 10*3/uL (ref 150–450)
RBC: 4.13 x10E6/uL — ABNORMAL LOW (ref 4.14–5.80)
RDW: 13.1 % (ref 11.6–15.4)
WBC: 7.7 10*3/uL (ref 3.4–10.8)

## 2021-09-25 LAB — LIPID PANEL
Chol/HDL Ratio: 2.2 ratio (ref 0.0–5.0)
Cholesterol, Total: 137 mg/dL (ref 100–199)
HDL: 62 mg/dL (ref >39)
LDL Chol Calc (NIH): 61 mg/dL (ref 0–99)
Triglycerides: 66 mg/dL (ref 0–149)
VLDL Cholesterol Cal: 14 mg/dL (ref 5–40)

## 2021-09-25 LAB — COMPREHENSIVE METABOLIC PANEL
ALT: 18 IU/L (ref 0–44)
AST: 28 IU/L (ref 0–40)
Albumin/Globulin Ratio: 1.4 (ref 1.2–2.2)
Albumin: 4.1 g/dL (ref 3.5–4.6)
Alkaline Phosphatase: 64 IU/L (ref 44–121)
BUN/Creatinine Ratio: 16 (ref 10–24)
BUN: 22 mg/dL (ref 10–36)
Bilirubin Total: 0.5 mg/dL (ref 0.0–1.2)
CO2: 22 mmol/L (ref 20–29)
Calcium: 10 mg/dL (ref 8.6–10.2)
Chloride: 104 mmol/L (ref 96–106)
Creatinine, Ser: 1.37 mg/dL — ABNORMAL HIGH (ref 0.76–1.27)
Globulin, Total: 2.9 g/dL (ref 1.5–4.5)
Glucose: 78 mg/dL (ref 70–99)
Potassium: 4.3 mmol/L (ref 3.5–5.2)
Sodium: 142 mmol/L (ref 134–144)
Total Protein: 7 g/dL (ref 6.0–8.5)
eGFR: 48 mL/min/{1.73_m2} — ABNORMAL LOW (ref >59)

## 2021-09-25 LAB — FOLATE: Folate: >20 ng/mL (ref >3.0)

## 2021-09-25 LAB — TSH: TSH: 1.41 u[IU]/mL (ref 0.450–4.500)

## 2021-09-25 LAB — VITAMIN B12: Vitamin B-12: 565 pg/mL (ref 232–1245)

## 2021-10-07 DIAGNOSIS — D485 Neoplasm of uncertain behavior of skin: Secondary | ICD-10-CM | POA: Diagnosis not present

## 2021-10-07 DIAGNOSIS — L821 Other seborrheic keratosis: Secondary | ICD-10-CM | POA: Diagnosis not present

## 2021-10-07 DIAGNOSIS — D045 Carcinoma in situ of skin of trunk: Secondary | ICD-10-CM | POA: Diagnosis not present

## 2021-10-09 ENCOUNTER — Encounter: Payer: Self-pay | Admitting: Family Medicine

## 2021-10-09 ENCOUNTER — Ambulatory Visit (INDEPENDENT_AMBULATORY_CARE_PROVIDER_SITE_OTHER): Payer: PPO | Admitting: Family Medicine

## 2021-10-09 VITALS — BP 95/56 | HR 68 | Temp 98.0°F | Resp 16 | Wt 150.0 lb

## 2021-10-09 DIAGNOSIS — J301 Allergic rhinitis due to pollen: Secondary | ICD-10-CM | POA: Diagnosis not present

## 2021-10-09 DIAGNOSIS — J0141 Acute recurrent pansinusitis: Secondary | ICD-10-CM | POA: Insufficient documentation

## 2021-10-09 MED ORDER — AZITHROMYCIN 250 MG PO TABS: ORAL_TABLET | ORAL | 0 refills | Status: AC

## 2021-10-09 MED ORDER — CETIRIZINE HCL 10 MG PO TABS: 10.0000 mg | ORAL_TABLET | Freq: Every day | ORAL | 11 refills | Status: DC

## 2021-10-09 NOTE — Assessment & Plan Note (Signed)
Recent travel out of state, to [place], where it was rainy/cold Also has been doing yard work, without a mask, and cleaning Recommend use of Zyrtec daily to assist

## 2021-10-09 NOTE — Progress Notes (Signed)
Established patient visit   I,[name],acting as a scribe for Matthew Sprout, FNP.,have documented all relevant documentation on the behalf of Matthew Sprout, FNP,as directed by  Matthew Sprout, FNP while in the presence of Matthew Sprout, FNP.   Patient: Matthew Clayton   DOB: 07/29/1926   88 y.o. Male  MRN: [id number] Visit Date: 10/09/2021  Today's healthcare provider: [name], FNP  Patient presents for new patient visit to establish care.  Introduced to Designer, jewellery role and practice setting.  All questions answered.  Discussed provider/patient relationship and expectations.   Chief Complaint  Patient presents with   Cough   Subjective    Cough This is a new problem. The current episode started in the past 7 days (3 days). The problem has been gradually worsening. The problem occurs every few minutes. The cough is Non-productive. Associated symptoms include a sore throat. Pertinent negatives include no chest pain, chills, ear congestion, ear pain, fever, headaches, heartburn, hemoptysis, myalgias, nasal congestion, postnasal drip, rash, rhinorrhea, shortness of breath, sweats, weight loss or wheezing. Nothing aggravates the symptoms.    Patient has had a cough for three days. Patient states symptoms started with a sore throat, which has now subsided. Patient has a history of blood clots on his lungs . Symptoms are similar. Patient has been taking otc Robitussin.  Medications: Outpatient Medications Prior to Visit  Medication Sig   ELIQUIS 5 MG TABS tablet TAKE 2 TABLETS (10 MG TOTAL) BY MOUTH 2 (TWO) TIMES DAILY. AND FROM 01/20/2021 TAKE 5 MG TWICE A DAY   Multiple Vitamin (MULTIVITAMIN PO) Take by mouth daily.   simvastatin (ZOCOR) 40 MG tablet Take 1 tablet by mouth daily.   No facility-administered medications prior to visit.    Review of Systems  Constitutional:  Negative for appetite change, chills, fever and weight loss.  HENT:  Positive for congestion and  sore throat. Negative for ear pain, postnasal drip and rhinorrhea.   Respiratory:  Positive for cough. Negative for hemoptysis, chest tightness, shortness of breath and wheezing.   Cardiovascular:  Negative for chest pain and palpitations.  Gastrointestinal:  Negative for abdominal pain, heartburn, nausea and vomiting.  Musculoskeletal:  Negative for myalgias.  Skin:  Negative for rash.  Neurological:  Negative for headaches.      Objective    BP (!) 95/56 (BP Location: Right Arm, Patient Position: Sitting, Cuff Size: Normal)   Pulse 68   Temp 98 F (36.7 C) (Temporal)   Resp 16   Wt 150 lb (68 kg)   SpO2 98%   BMI 24.21 kg/m    Physical Exam Vitals and nursing note reviewed.  Constitutional:      Appearance: Normal appearance. He is normal weight.  HENT:     Head: Normocephalic and atraumatic.     Right Ear: Tympanic membrane, ear canal and external ear normal.     Left Ear: Tympanic membrane, ear canal and external ear normal.     Nose: Congestion and rhinorrhea present.     Right Sinus: Maxillary sinus tenderness and frontal sinus tenderness present.     Left Sinus: Maxillary sinus tenderness and frontal sinus tenderness present.     Comments: PND    Mouth/Throat:     Mouth: Mucous membranes are moist.     Pharynx: Posterior oropharyngeal erythema present. No oropharyngeal exudate.  Eyes:     Pupils: Pupils are equal, round, and reactive to light.  Cardiovascular:  Rate and Rhythm: Normal rate and regular rhythm.     Pulses: Normal pulses.     Heart sounds: Normal heart sounds.  Pulmonary:     Effort: Pulmonary effort is normal.     Breath sounds: Normal breath sounds.  Musculoskeletal:        General: Normal range of motion.     Cervical back: Normal range of motion.  Skin:    General: Skin is warm and dry.     Capillary Refill: Capillary refill takes less than 2 seconds.  Neurological:     General: No focal deficit present.     Mental Status: He is alert  and oriented to person, place, and time. Mental status is at baseline.  Psychiatric:        Mood and Affect: Mood normal.        Behavior: Behavior normal.        Thought Content: Thought content normal.        Judgment: Judgment normal.     No results found for any visits on 10/09/21.  Assessment & Plan     Problem List Items Addressed This Visit       Respiratory   Acute recurrent pansinusitis - Primary    Acute, stable Use of Zpak requested given complaints of coughing, congestion, runny nose, PND/sore throat ie Allergic rhinitis plus pansinuitis; do not feel concern for DVT/PE given recent flight history  2 hr flight to [place] as pt is on Eliquis and as not missed dosage Recommend follow up with Xray, chest, if SOB/DOE, difficulty taking deep breath, AMS/confusion, discoloration of lips/face/hands evolve  RTC as needed        Relevant Medications   azithromycin (ZITHROMAX) 250 MG tablet   cetirizine (ZYRTEC) 10 MG tablet   Allergic rhinitis    Recent travel out of state, to [place], where it was rainy/cold Also has been doing yard work, without a mask, and cleaning Recommend use of Zyrtec daily to assist       Relevant Medications   cetirizine (ZYRTEC) 10 MG tablet     Return if symptoms worsen or fail to improve.      [name], FNP, have reviewed all documentation for this visit. The documentation on 10/09/21 for the exam, diagnosis, procedures, and orders are all accurate and complete.    Matthew Clayton, [place] [phone] (phone) [phone] (fax)  [place]

## 2021-10-09 NOTE — Assessment & Plan Note (Signed)
Acute, stable Use of Zpak requested given complaints of coughing, congestion, runny nose, PND/sore throat ie Allergic rhinitis plus pansinuitis; do not feel concern for DVT/PE given recent flight history  2 hr flight to [place] as pt is on Eliquis and as not missed dosage Recommend follow up with Xray, chest, if SOB/DOE, difficulty taking deep breath, AMS/confusion, discoloration of lips/face/hands evolve  RTC as needed

## 2021-10-11 ENCOUNTER — Emergency Department
Admission: EM | Admit: 2021-10-11 | Discharge: 2021-10-11 | Disposition: A | Discharge status: Home / Self Care | Payer: PPO | Attending: Emergency Medicine | Admitting: Emergency Medicine

## 2021-10-11 ENCOUNTER — Emergency Department: Payer: PPO

## 2021-10-11 DIAGNOSIS — R051 Acute cough: Secondary | ICD-10-CM | POA: Diagnosis not present

## 2021-10-11 DIAGNOSIS — M19031 Primary osteoarthritis, right wrist: Secondary | ICD-10-CM | POA: Diagnosis not present

## 2021-10-11 DIAGNOSIS — R059 Cough, unspecified: Secondary | ICD-10-CM | POA: Diagnosis not present

## 2021-10-11 DIAGNOSIS — M1990 Unspecified osteoarthritis, unspecified site: Secondary | ICD-10-CM | POA: Diagnosis not present

## 2021-10-11 DIAGNOSIS — M7989 Other specified soft tissue disorders: Secondary | ICD-10-CM | POA: Diagnosis not present

## 2021-10-11 DIAGNOSIS — Z0389 Encounter for observation for other suspected diseases and conditions ruled out: Secondary | ICD-10-CM | POA: Diagnosis not present

## 2021-10-11 DIAGNOSIS — M19039 Primary osteoarthritis, unspecified wrist: Secondary | ICD-10-CM

## 2021-10-11 DIAGNOSIS — M189 Osteoarthritis of first carpometacarpal joint, unspecified: Secondary | ICD-10-CM | POA: Diagnosis not present

## 2021-10-11 DIAGNOSIS — M25531 Pain in right wrist: Secondary | ICD-10-CM | POA: Diagnosis present

## 2021-10-11 LAB — CBC
HCT: 40.1 % (ref 39.0–52.0)
Hemoglobin: 13.2 g/dL (ref 13.0–17.0)
MCH: 32.2 pg (ref 26.0–34.0)
MCHC: 32.9 g/dL (ref 30.0–36.0)
MCV: 97.8 fL (ref 80.0–100.0)
Platelets: 199 10*3/uL (ref 150–400)
RBC: 4.1 MIL/uL — ABNORMAL LOW (ref 4.22–5.81)
RDW: 14.1 % (ref 11.5–15.5)
WBC: 11 10*3/uL — ABNORMAL HIGH (ref 4.0–10.5)
nRBC: 0 % (ref 0.0–0.2)

## 2021-10-11 LAB — BASIC METABOLIC PANEL
Anion gap: 10 (ref 5–15)
BUN: 19 mg/dL (ref 8–23)
CO2: 24 mmol/L (ref 22–32)
Calcium: 9.4 mg/dL (ref 8.9–10.3)
Chloride: 106 mmol/L (ref 98–111)
Creatinine, Ser: 1.26 mg/dL — ABNORMAL HIGH (ref 0.61–1.24)
GFR, Estimated: 54 mL/min — ABNORMAL LOW (ref >60)
Glucose, Bld: 135 mg/dL — ABNORMAL HIGH (ref 70–99)
Potassium: 3.9 mmol/L (ref 3.5–5.1)
Sodium: 140 mmol/L (ref 135–145)

## 2021-10-11 MED ORDER — TRAMADOL HCL 50 MG PO TABS: 50.0000 mg | ORAL_TABLET | Freq: Four times a day (QID) | ORAL | 0 refills | Status: DC | PRN

## 2021-10-11 NOTE — ED Triage Notes (Signed)
Pt comes pov with right wrist pain/swelling and cough/congestion. Pt was given a z pack and decongestant but wife states he has been lethargic today. Hx of arthritis but no hx of gout. Aox4, ambulatory.

## 2021-10-11 NOTE — ED Notes (Signed)
Lactic sent if needed

## 2021-10-11 NOTE — ED Provider Notes (Signed)
[facility] Provider Note    Event Date/Time   First MD Initiated Contact with Patient 10/11/21 1604     (approximate)   History   Wrist Pain and Cough   HPI  Matthew Clayton is a 88 y.o. male who presents with complaints of right wrist pain as well as a cough.  Patient reports he developed a cough last Thursday after weed eating without a mask on, his PCP prescribed Z-Pak and cetirizine and he states his symptoms have improved.  He also reports he was working on his mountain house this weekend doing a lot of raking and his right wrist is swollen.     Physical Exam   Triage Vital Signs: ED Triage Vitals [10/11/21 1559]  Enc Vitals Group     BP 120/70     Pulse Rate 67     Resp 18     Temp 98.5 F (36.9 C)     Temp Source Oral     SpO2 95 %     Weight      Height      Head Circumference      Peak Flow      Pain Score 5     Pain Loc      Pain Edu?      Excl. in [place]?     Most recent vital signs: Vitals:   10/11/21 1559 10/11/21 1758  BP: 120/70 120/70  Pulse: 67 67  Resp: 18 18  Temp: 98.5 F (36.9 C) 98.5 F (36.9 C)  SpO2: 95% 95%     General: Awake, no distress.  CV:  Good peripheral perfusion.  Resp:  Normal effort.  Clear to auscultation bilaterally Abd:  No distention.  Other:  Right wrist, significant dorsal lateral swelling.  No streaking, no fluctuance   ED Results / Procedures / Treatments   Labs (all labs ordered are listed, but only abnormal results are displayed) Labs Reviewed  CBC - Abnormal; Notable for the following components:      Result Value   WBC 11.0 (*)    RBC 4.10 (*)    All other components within normal limits  BASIC METABOLIC PANEL - Abnormal; Notable for the following components:   Glucose, Bld 135 (*)    Creatinine, Ser 1.26 (*)    GFR, Estimated 54 (*)    All other components within normal limits     EKG     RADIOLOGY Chest x-ray viewed interpreted by me, no acute  abnormality    PROCEDURES:  Critical Care performed:   Procedures   MEDICATIONS ORDERED IN ED: Medications - No data to display   IMPRESSION / MDM / [place] / ED COURSE  I reviewed the triage vital signs and the nursing notes. Patient's presentation is most consistent with acute illness / injury with system symptoms.  Patient presents with cough as detailed above, he feels that is improving however his wife is concerned about it.  He denies shortness of breath.  Differential includes bronchitis, pneumonia, viral illness  Patient also has right wrist swelling after significant physical activity over the past several days, differential includes osteoarthritis, gout, infection  Wrist x-ray is most consistent with inflammation, doubt cellulitis, mild elevation of white blood cell count is nonspecific.  Chest x-ray is without evidence of pneumonia, recommend finishing Z-Pak  Tramadol prescribed for wrist pain, wrist brace applied, recommend icing, outpatient follow-up with hand specialist        FINAL  CLINICAL IMPRESSION(S) / ED DIAGNOSES   Final diagnoses:  Acute cough  Wrist arthritis     Rx / DC Orders   ED Discharge Orders          Ordered    traMADol (ULTRAM) 50 MG tablet  Every 6 hours PRN        10/11/21 1748             Note:  This document was prepared using Dragon voice recognition software and may include unintentional dictation errors.   Lavonia Drafts, MD 10/11/21 1929

## 2021-10-13 DIAGNOSIS — R001 Bradycardia, unspecified: Secondary | ICD-10-CM | POA: Diagnosis not present

## 2021-10-13 DIAGNOSIS — I10 Essential (primary) hypertension: Secondary | ICD-10-CM | POA: Diagnosis not present

## 2021-10-13 DIAGNOSIS — I495 Sick sinus syndrome: Secondary | ICD-10-CM | POA: Diagnosis not present

## 2021-10-13 DIAGNOSIS — Z86718 Personal history of other venous thrombosis and embolism: Secondary | ICD-10-CM | POA: Diagnosis not present

## 2021-11-03 DIAGNOSIS — G5602 Carpal tunnel syndrome, left upper limb: Secondary | ICD-10-CM | POA: Diagnosis not present

## 2021-11-26 DIAGNOSIS — G5603 Carpal tunnel syndrome, bilateral upper limbs: Secondary | ICD-10-CM | POA: Diagnosis not present

## 2021-12-01 DIAGNOSIS — G5600 Carpal tunnel syndrome, unspecified upper limb: Secondary | ICD-10-CM | POA: Diagnosis not present

## 2021-12-02 DIAGNOSIS — L905 Scar conditions and fibrosis of skin: Secondary | ICD-10-CM | POA: Diagnosis not present

## 2021-12-02 DIAGNOSIS — D045 Carcinoma in situ of skin of trunk: Secondary | ICD-10-CM | POA: Diagnosis not present

## 2021-12-03 ENCOUNTER — Telehealth (INDEPENDENT_AMBULATORY_CARE_PROVIDER_SITE_OTHER): Payer: Self-pay

## 2021-12-03 ENCOUNTER — Telehealth: Payer: Self-pay

## 2021-12-03 NOTE — Telephone Encounter (Signed)
Copied from [place] [id number]. Topic: General - Other >> Dec 03, 2021  9:55 AM Chapman Fitch wrote: Reason for CRM: Pt wanted to let Rosanna Randy know that he has been scheduled for hand surgery on the last Friday in August  8.25.23/ please advise if needed or any concerns

## 2021-12-03 NOTE — Telephone Encounter (Signed)
Wear compression as prescribed

## 2021-12-03 NOTE — Telephone Encounter (Signed)
Patient made aware with medical recommendations and verbalized understanding 

## 2021-12-07 DIAGNOSIS — S51011A Laceration without foreign body of right elbow, initial encounter: Secondary | ICD-10-CM | POA: Diagnosis not present

## 2021-12-07 DIAGNOSIS — S80211A Abrasion, right knee, initial encounter: Secondary | ICD-10-CM | POA: Diagnosis not present

## 2021-12-07 DIAGNOSIS — W19XXXA Unspecified fall, initial encounter: Secondary | ICD-10-CM | POA: Diagnosis not present

## 2022-01-03 ENCOUNTER — Other Ambulatory Visit: Payer: Self-pay | Admitting: Family Medicine

## 2022-01-08 DIAGNOSIS — Z950 Presence of cardiac pacemaker: Secondary | ICD-10-CM | POA: Diagnosis not present

## 2022-01-08 DIAGNOSIS — Z7901 Long term (current) use of anticoagulants: Secondary | ICD-10-CM | POA: Diagnosis not present

## 2022-01-08 DIAGNOSIS — G5601 Carpal tunnel syndrome, right upper limb: Secondary | ICD-10-CM | POA: Diagnosis not present

## 2022-01-28 DIAGNOSIS — G5600 Carpal tunnel syndrome, unspecified upper limb: Secondary | ICD-10-CM | POA: Diagnosis not present

## 2022-02-03 ENCOUNTER — Other Ambulatory Visit: Payer: Self-pay | Admitting: Family Medicine

## 2022-02-18 ENCOUNTER — Ambulatory Visit: Payer: PPO | Admitting: Podiatry

## 2022-02-22 ENCOUNTER — Ambulatory Visit (INDEPENDENT_AMBULATORY_CARE_PROVIDER_SITE_OTHER): Payer: PPO

## 2022-02-22 ENCOUNTER — Encounter: Payer: Self-pay | Admitting: Podiatry

## 2022-02-22 ENCOUNTER — Ambulatory Visit: Payer: PPO | Admitting: Podiatry

## 2022-02-22 DIAGNOSIS — M778 Other enthesopathies, not elsewhere classified: Secondary | ICD-10-CM

## 2022-02-22 NOTE — Progress Notes (Signed)
  Subjective:  Patient ID: Matthew Clayton, male    DOB: 18-Nov-1926,  MRN: [id number] HPI Chief Complaint  Patient presents with   Foot Pain    1st MPJ left - aching x 1.5 day, appeared normal, but felt like he stumped it, but no injury, gets alarmed with any pain due to past DVT   New Patient (Initial Visit)    88 y.o. male presents with the above complaint.   ROS: Denies fever chills nausea body muscle aches pains calf pain back pain chest pain shortness of breath.  Past Medical History:  Diagnosis Date   DVT (deep venous thrombosis) (HCC)    left leg   Hyperlipidemia    Past Surgical History:  Procedure Laterality Date   APPENDECTOMY     MOLE REMOVAL     PACEMAKER LEADLESS INSERTION N/A 12/09/2020   Procedure: PACEMAKER LEADLESS INSERTION;  Surgeon: Isaias Cowman, MD;  Location: [facility];  Service: Cardiovascular;  Laterality: N/A;   PULMONARY THROMBECTOMY Bilateral 01/12/2021   Procedure: PULMONARY THROMBECTOMY;  Surgeon: Algernon Huxley, MD;  Location: [facility];  Service: Cardiovascular;  Laterality: Bilateral;   TONSILLECTOMY AND ADENOIDECTOMY     TOTAL HIP ARTHROPLASTY Bilateral     Current Outpatient Medications:    cetirizine (ZYRTEC) 10 MG tablet, Take 1 tablet (10 mg total) by mouth daily., Disp: 30 tablet, Rfl: 11   ELIQUIS 5 MG TABS tablet, TAKE 2 TABLETS (10 MG TOTAL) BY MOUTH 2 (TWO) TIMES DAILY. AND FROM 01/20/2021 TAKE 5 MG TWICE A DAY, Disp: 60 tablet, Rfl: 3   ibuprofen (ADVIL) 600 MG tablet, Take 600 mg by mouth 3 (three) times daily., Disp: , Rfl:    Multiple Vitamin (MULTIVITAMIN PO), Take by mouth daily., Disp: , Rfl:    simvastatin (ZOCOR) 40 MG tablet, TAKE 1 TABLET BY MOUTH EVERY DAY, Disp: 90 tablet, Rfl: 2  No Known Allergies Review of Systems Objective:  There were no vitals filed for this visit.  General: Well developed, nourished, in no acute distress, alert and oriented x3   Dermatological: Skin is warm, dry and supple  bilateral. Nails x 10 are well maintained; remaining integument appears unremarkable at this time. There are no open sores, no preulcerative lesions, no rash or signs of infection present.  Vascular: Dorsalis Pedis artery and Posterior Tibial artery pedal pulses are 2/4 bilateral with immedate capillary fill time. Pedal hair growth present. No varicosities and no lower extremity edema present bilateral.   Neruologic: Grossly intact via light touch bilateral. Vibratory intact via tuning fork bilateral. Protective threshold with Semmes Wienstein monofilament intact to all pedal sites bilateral. Patellar and Achilles deep tendon reflexes 2+ bilateral. No Babinski or clonus noted bilateral.   Musculoskeletal: No gross boney pedal deformities bilateral. No pain, crepitus, or limitation noted with foot and ankle range of motion bilateral. Muscular strength 5/5 in all groups tested bilateral.  No reproducible pain on palpation.  Gait: Unassisted, Nonantalgic.    Radiographs:  Radiographs taken today demonstrate an osseously mature individual osteoarthritic changes of the forefoot midfoot.  No acute findings.  Assessment & Plan:   Assessment: Osteoarthritis first metatarsophalangeal joint left  Plan: Recommended topical anti-inflammatories.  Discussed possible need for injections.     Hilmer Aliberti T. [place], [place]

## 2022-02-23 DIAGNOSIS — I495 Sick sinus syndrome: Secondary | ICD-10-CM | POA: Diagnosis not present

## 2022-02-24 DIAGNOSIS — G5600 Carpal tunnel syndrome, unspecified upper limb: Secondary | ICD-10-CM | POA: Diagnosis not present

## 2022-03-09 DIAGNOSIS — Z23 Encounter for immunization: Secondary | ICD-10-CM | POA: Diagnosis not present

## 2022-03-10 DIAGNOSIS — G5600 Carpal tunnel syndrome, unspecified upper limb: Secondary | ICD-10-CM | POA: Diagnosis not present

## 2022-03-15 ENCOUNTER — Encounter (INDEPENDENT_AMBULATORY_CARE_PROVIDER_SITE_OTHER): Payer: Self-pay

## 2022-03-18 DIAGNOSIS — H903 Sensorineural hearing loss, bilateral: Secondary | ICD-10-CM | POA: Diagnosis not present

## 2022-03-30 ENCOUNTER — Ambulatory Visit (INDEPENDENT_AMBULATORY_CARE_PROVIDER_SITE_OTHER): Payer: PPO | Admitting: Family Medicine

## 2022-03-30 ENCOUNTER — Encounter: Payer: Self-pay | Admitting: Family Medicine

## 2022-03-30 ENCOUNTER — Ambulatory Visit: Payer: PPO | Admitting: Family Medicine

## 2022-03-30 VITALS — BP 104/63 | HR 57 | Temp 97.5°F | Resp 16 | Wt 149.0 lb

## 2022-03-30 DIAGNOSIS — L659 Nonscarring hair loss, unspecified: Secondary | ICD-10-CM | POA: Diagnosis not present

## 2022-03-30 DIAGNOSIS — L603 Nail dystrophy: Secondary | ICD-10-CM | POA: Insufficient documentation

## 2022-03-30 DIAGNOSIS — I10 Essential (primary) hypertension: Secondary | ICD-10-CM | POA: Diagnosis not present

## 2022-03-30 DIAGNOSIS — E782 Mixed hyperlipidemia: Secondary | ICD-10-CM | POA: Diagnosis not present

## 2022-03-30 NOTE — Assessment & Plan Note (Signed)
Will check TSH, CBC, CMP

## 2022-03-30 NOTE — Assessment & Plan Note (Signed)
New problem  Could be related to thyroid abnormality, vitamin deficiency, electrolyte abnormalities  Will check TSH, CMP, CBC

## 2022-03-30 NOTE — Progress Notes (Signed)
I,Joseline E Rosas,acting as a scribe for [employer], MD.,have documented all relevant documentation on the behalf of Eulis Foster, MD,as directed by  Eulis Foster, MD while in the presence of Eulis Foster, MD.   Established patient visit   Patient: Matthew Clayton   DOB: [birth year]   88 y.o. Male  MRN: [id number] Visit Date: 03/30/2022  Today's healthcare provider: Eulis Foster, MD   Chief Complaint  Patient presents with   Follow-Up Chronic Disease   Subjective    HPI  Lipid/Cholesterol, Follow-up  Last lipid panel Other pertinent labs  Lab Results  Component Value Date   CHOL 137 09/24/2021   HDL 62 09/24/2021   LDLCALC 61 09/24/2021   TRIG 66 09/24/2021   CHOLHDL 2.2 09/24/2021   Lab Results  Component Value Date   ALT 18 09/24/2021   AST 28 09/24/2021   PLT 199 10/11/2021   TSH 1.410 09/24/2021     He was last seen for this 6 months ago.  Management since that visit includes continuing Simvastatin 40 mg.  He reports excellent compliance with treatment. He is not having side effects.   Symptoms: No chest pain No chest pressure/discomfort  No dyspnea No lower extremity edema  No numbness or tingling of extremity No orthopnea  No palpitations No paroxysmal nocturnal dyspnea  No speech difficulty No syncope   Current diet:  general Current exercise: yard work  The ASCVD Risk score (Arnett DK, et al., 2019) failed to calculate for the following reasons:   The 2019 ASCVD risk score is only valid for ages 38 to 79  ---------------------------------------------------------------------------------------------------   Nail and Hair Changes   Appointment was brief as patient reports 10:40 AM appt however appt was scheduled for 11:00AM and he reports needing to leave for 11:30 AM appt  Patient also concerns about brittle nails and hair loss. Reports this has been happening for a few months  He  reports hx of arthritis that has been going on for years prior to changes in his nails and hair  He denies current pain  He reports a decline in his health in July 2022 and this seems to be a part of that change  He denies heat/cold intolerance  Reports normal appetite  Denies unintentional weight loss (down 1 lb since may 2023) No neck or throat pain    Medications: Outpatient Medications Prior to Visit  Medication Sig   cetirizine (ZYRTEC) 10 MG tablet Take 1 tablet (10 mg total) by mouth daily.   ELIQUIS 5 MG TABS tablet TAKE 2 TABLETS (10 MG TOTAL) BY MOUTH 2 (TWO) TIMES DAILY. AND FROM 01/20/2021 TAKE 5 MG TWICE A DAY   Multiple Vitamin (MULTIVITAMIN PO) Take by mouth daily.   simvastatin (ZOCOR) 40 MG tablet TAKE 1 TABLET BY MOUTH EVERY DAY   [DISCONTINUED] ibuprofen (ADVIL) 600 MG tablet Take 600 mg by mouth 3 (three) times daily.   No facility-administered medications prior to visit.    Review of Systems  Endocrine: Negative for cold intolerance and heat intolerance.  Skin:         Nail changes        Objective    BP 104/63 (BP Location: Right Arm, Patient Position: Sitting, Cuff Size: Normal)   Pulse (!) 57   Temp (!) 97.5 F (36.4 C) (Oral)   Resp 16   Wt 149 lb (67.6 kg)   SpO2 100%   BMI 24.05 kg/m    Physical Exam Neck:  Thyroid: No thyroid mass, thyromegaly or thyroid tenderness.  Cardiovascular:     Pulses: Normal pulses.     Heart sounds: Murmur heard.  Musculoskeletal:        General: Deformity present.     Right hand: Deformity present. No tenderness or bony tenderness.     Left hand: Deformity present. No tenderness or bony tenderness.     Cervical back: Normal range of motion and neck supple. No erythema.     Comments: Bilateral ulnar deviation of hands  Enlarged carpal and metacarpal joints bilaterally  No warmth or erythema noted on today's exam    Skin:    General: Skin is dry.     Findings: No erythema, lesion or rash.     Nails:  There is clubbing.          No results found for any visits on 03/30/22.  Assessment & Plan     Problem List Items Addressed This Visit       Cardiovascular and Mediastinum   Essential hypertension   Relevant Orders   TSH + free T4   Comprehensive Metabolic Panel (CMET)   CBC with Differential/Platelet     Musculoskeletal and Integument   Brittle nails - Primary    Will check TSH, CBC, CMP        Relevant Orders   TSH + free T4     Other   Mixed hyperlipidemia    Stable  Followed by cardiology  Patient will continue zocor '40mg'$  daily  Lipid panel collected today       Relevant Orders   Lipid panel   Comprehensive Metabolic Panel (CMET)   CBC with Differential/Platelet   Hair loss    New problem  Could be related to thyroid abnormality, vitamin deficiency, electrolyte abnormalities  Will check TSH, CMP, CBC        Relevant Orders   TSH + free T4    Return if symptoms worsen or fail to improve.      I, Eulis Foster, MD, have reviewed all documentation for this visit. The documentation on 03/30/22 for the exam, diagnosis, procedures, and orders are all accurate and complete.  Portions of this information were initially documented by the CMA and reviewed by me for thoroughness and accuracy.      Eulis Foster, MD  [facility] [phone] (phone) [phone] (fax)  [place]

## 2022-03-30 NOTE — Assessment & Plan Note (Signed)
Stable  Followed by cardiology  Patient will continue zocor '40mg'$  daily  Lipid panel collected today

## 2022-03-31 LAB — TSH+FREE T4
Free T4: 1.21 ng/dL (ref 0.82–1.77)
TSH: 1.98 u[IU]/mL (ref 0.450–4.500)

## 2022-03-31 LAB — COMPREHENSIVE METABOLIC PANEL
ALT: 14 IU/L (ref 0–44)
AST: 18 IU/L (ref 0–40)
Albumin/Globulin Ratio: 1.5 (ref 1.2–2.2)
Albumin: 4.3 g/dL (ref 3.6–4.6)
Alkaline Phosphatase: 72 IU/L (ref 44–121)
BUN/Creatinine Ratio: 14 (ref 10–24)
BUN: 17 mg/dL (ref 10–36)
Bilirubin Total: 0.5 mg/dL (ref 0.0–1.2)
CO2: 22 mmol/L (ref 20–29)
Calcium: 9.9 mg/dL (ref 8.6–10.2)
Chloride: 104 mmol/L (ref 96–106)
Creatinine, Ser: 1.25 mg/dL (ref 0.76–1.27)
Globulin, Total: 2.9 g/dL (ref 1.5–4.5)
Glucose: 88 mg/dL (ref 70–99)
Potassium: 4.5 mmol/L (ref 3.5–5.2)
Sodium: 140 mmol/L (ref 134–144)
Total Protein: 7.2 g/dL (ref 6.0–8.5)
eGFR: 54 mL/min/{1.73_m2} — ABNORMAL LOW (ref >59)

## 2022-03-31 LAB — CBC WITH DIFFERENTIAL/PLATELET
Basophils Absolute: 0.1 10*3/uL (ref 0.0–0.2)
Basos: 1 %
EOS (ABSOLUTE): 0.4 10*3/uL (ref 0.0–0.4)
Eos: 4 %
Hematocrit: 40.2 % (ref 37.5–51.0)
Hemoglobin: 13.7 g/dL (ref 13.0–17.7)
Immature Grans (Abs): 0 10*3/uL (ref 0.0–0.1)
Immature Granulocytes: 0 %
Lymphocytes Absolute: 1.7 10*3/uL (ref 0.7–3.1)
Lymphs: 20 %
MCH: 33 pg (ref 26.6–33.0)
MCHC: 34.1 g/dL (ref 31.5–35.7)
MCV: 97 fL (ref 79–97)
Monocytes Absolute: 0.7 10*3/uL (ref 0.1–0.9)
Monocytes: 8 %
Neutrophils Absolute: 5.7 10*3/uL (ref 1.4–7.0)
Neutrophils: 67 %
RBC: 4.15 x10E6/uL (ref 4.14–5.80)
RDW: 13.2 % (ref 11.6–15.4)
WBC: 8.5 10*3/uL (ref 3.4–10.8)

## 2022-03-31 LAB — LIPID PANEL
Chol/HDL Ratio: 2.3 ratio (ref 0.0–5.0)
Cholesterol, Total: 136 mg/dL (ref 100–199)
HDL: 60 mg/dL (ref >39)
LDL Chol Calc (NIH): 62 mg/dL (ref 0–99)
Triglycerides: 67 mg/dL (ref 0–149)
VLDL Cholesterol Cal: 14 mg/dL (ref 5–40)

## 2022-04-26 DIAGNOSIS — R0989 Other specified symptoms and signs involving the circulatory and respiratory systems: Secondary | ICD-10-CM | POA: Diagnosis not present

## 2022-04-26 DIAGNOSIS — I82593 Chronic embolism and thrombosis of other specified deep vein of lower extremity, bilateral: Secondary | ICD-10-CM | POA: Diagnosis not present

## 2022-04-26 DIAGNOSIS — Z86718 Personal history of other venous thrombosis and embolism: Secondary | ICD-10-CM | POA: Diagnosis not present

## 2022-04-30 ENCOUNTER — Other Ambulatory Visit: Payer: Self-pay

## 2022-04-30 ENCOUNTER — Ambulatory Visit (INDEPENDENT_AMBULATORY_CARE_PROVIDER_SITE_OTHER): Payer: PPO | Admitting: Nurse Practitioner

## 2022-04-30 ENCOUNTER — Emergency Department: Payer: PPO

## 2022-04-30 ENCOUNTER — Emergency Department
Admission: EM | Admit: 2022-04-30 | Discharge: 2022-04-30 | Disposition: A | Discharge status: Home / Self Care | Payer: PPO | Attending: Emergency Medicine | Admitting: Emergency Medicine

## 2022-04-30 ENCOUNTER — Telehealth (INDEPENDENT_AMBULATORY_CARE_PROVIDER_SITE_OTHER): Payer: Self-pay

## 2022-04-30 VITALS — BP 103/66 | HR 66 | Resp 16 | Wt 150.0 lb

## 2022-04-30 DIAGNOSIS — Z79899 Other long term (current) drug therapy: Secondary | ICD-10-CM | POA: Diagnosis not present

## 2022-04-30 DIAGNOSIS — R2242 Localized swelling, mass and lump, left lower limb: Secondary | ICD-10-CM | POA: Diagnosis present

## 2022-04-30 DIAGNOSIS — M7122 Synovial cyst of popliteal space [Baker], left knee: Secondary | ICD-10-CM

## 2022-04-30 DIAGNOSIS — I824Y2 Acute embolism and thrombosis of unspecified deep veins of left proximal lower extremity: Secondary | ICD-10-CM

## 2022-04-30 DIAGNOSIS — Z96643 Presence of artificial hip joint, bilateral: Secondary | ICD-10-CM | POA: Insufficient documentation

## 2022-04-30 DIAGNOSIS — I10 Essential (primary) hypertension: Secondary | ICD-10-CM | POA: Diagnosis not present

## 2022-04-30 DIAGNOSIS — I82412 Acute embolism and thrombosis of left femoral vein: Secondary | ICD-10-CM

## 2022-04-30 DIAGNOSIS — N189 Chronic kidney disease, unspecified: Secondary | ICD-10-CM | POA: Insufficient documentation

## 2022-04-30 DIAGNOSIS — I82432 Acute embolism and thrombosis of left popliteal vein: Secondary | ICD-10-CM | POA: Diagnosis not present

## 2022-04-30 DIAGNOSIS — E782 Mixed hyperlipidemia: Secondary | ICD-10-CM

## 2022-04-30 DIAGNOSIS — Z7901 Long term (current) use of anticoagulants: Secondary | ICD-10-CM | POA: Diagnosis not present

## 2022-04-30 LAB — CBC
HCT: 43.1 % (ref 39.0–52.0)
Hemoglobin: 13.8 g/dL (ref 13.0–17.0)
MCH: 32.2 pg (ref 26.0–34.0)
MCHC: 32 g/dL (ref 30.0–36.0)
MCV: 100.5 fL — ABNORMAL HIGH (ref 80.0–100.0)
Platelets: 207 10*3/uL (ref 150–400)
RBC: 4.29 MIL/uL (ref 4.22–5.81)
RDW: 13.3 % (ref 11.5–15.5)
WBC: 8.7 10*3/uL (ref 4.0–10.5)
nRBC: 0 % (ref 0.0–0.2)

## 2022-04-30 LAB — BASIC METABOLIC PANEL
Anion gap: 8 (ref 5–15)
BUN: 23 mg/dL (ref 8–23)
CO2: 26 mmol/L (ref 22–32)
Calcium: 9.6 mg/dL (ref 8.9–10.3)
Chloride: 106 mmol/L (ref 98–111)
Creatinine, Ser: 1.37 mg/dL — ABNORMAL HIGH (ref 0.61–1.24)
GFR, Estimated: 48 mL/min — ABNORMAL LOW (ref >60)
Glucose, Bld: 106 mg/dL — ABNORMAL HIGH (ref 70–99)
Potassium: 4 mmol/L (ref 3.5–5.1)
Sodium: 140 mmol/L (ref 135–145)

## 2022-04-30 LAB — D-DIMER, QUANTITATIVE: D-Dimer, Quant: 0.65 ug/mL-FEU — ABNORMAL HIGH (ref 0.00–0.50)

## 2022-04-30 MED ORDER — ENOXAPARIN SODIUM 80 MG/0.8ML IJ SOSY: 67.0000 mg | PREFILLED_SYRINGE | Freq: Two times a day (BID) | INTRAMUSCULAR | 1 refills | Status: DC

## 2022-04-30 MED ORDER — ENOXAPARIN SODIUM 80 MG/0.8ML IJ SOSY: 67.0000 mg | PREFILLED_SYRINGE | Freq: Two times a day (BID) | INTRAMUSCULAR | 0 refills | Status: DC

## 2022-04-30 MED ORDER — ENOXAPARIN SODIUM 80 MG/0.8ML IJ SOSY
1.0000 mg/kg | PREFILLED_SYRINGE | Freq: Once | INTRAMUSCULAR | Status: AC
  Administered 2022-04-30: 67.5 mg via SUBCUTANEOUS
  Filled 2022-04-30: qty 0.68

## 2022-04-30 NOTE — Progress Notes (Signed)
PHARMACY -  BRIEF ANTICOAGULATION NOTE   Pharmacy has received consult(s) for Lovenox for VTE Tx from an ED provider.  The patient's profile has been reviewed for ht/wt/allergies/indication/available labs.    One time order(s) placed for Lovenox 1 mg/kg.  Further pharmacy consults should be ordered by admitting physician if indicated.                       Thank you, Renda Rolls, PharmD, MBA 04/30/2022 4:20 AM

## 2022-04-30 NOTE — ED Provider Notes (Signed)
[facility] Provider Note    Event Date/Time   First MD Initiated Contact with Patient 04/30/22 0335     (approximate)   History   Leg Swelling   HPI  Matthew Clayton is a 88 y.o. male history of previous DVT and PE on Eliquis who presents to the emergency department with concerns for recurrent left lower extremity DVT as he noticed swelling to the left ankle.  No injury.  Able to ambulate.  No chest pain or shortness of breath.   History provided by patient and family.    Past Medical History:  Diagnosis Date   DVT (deep venous thrombosis) (HCC)    left leg   Hyperlipidemia     Past Surgical History:  Procedure Laterality Date   APPENDECTOMY     MOLE REMOVAL     PACEMAKER LEADLESS INSERTION N/A 12/09/2020   Procedure: PACEMAKER LEADLESS INSERTION;  Surgeon: Isaias Cowman, MD;  Location: [facility];  Service: Cardiovascular;  Laterality: N/A;   PULMONARY THROMBECTOMY Bilateral 01/12/2021   Procedure: PULMONARY THROMBECTOMY;  Surgeon: Algernon Huxley, MD;  Location: [facility];  Service: Cardiovascular;  Laterality: Bilateral;   TONSILLECTOMY AND ADENOIDECTOMY     TOTAL HIP ARTHROPLASTY Bilateral     MEDICATIONS:  Prior to Admission medications   Medication Sig Start Date End Date Taking? Authorizing Provider  cetirizine (ZYRTEC) 10 MG tablet Take 1 tablet (10 mg total) by mouth daily. 10/09/21   [name], FNP  ELIQUIS 5 MG TABS tablet TAKE 2 TABLETS (10 MG TOTAL) BY MOUTH 2 (TWO) TIMES DAILY. AND FROM 01/20/2021 TAKE 5 MG TWICE A DAY 02/03/22   Jerrol Banana., MD  Multiple Vitamin (MULTIVITAMIN PO) Take by mouth daily.    [provider]  simvastatin (ZOCOR) 40 MG tablet TAKE 1 TABLET BY MOUTH EVERY DAY 01/04/22   Jerrol Banana., MD    Physical Exam   Triage Vital Signs: ED Triage Vitals  Enc Vitals Group     BP 04/30/22 0132 (!) 146/79     Pulse Rate 04/30/22 0132 63     Resp 04/30/22  0132 14     Temp 04/30/22 0132 (!) 97.5 F (36.4 C)     Temp Source 04/30/22 0132 Oral     SpO2 04/30/22 0132 100 %     Weight 04/30/22 0133 148 lb (67.1 kg)     Height 04/30/22 0133 '5\' 5"'$  (1.651 m)     Head Circumference --      Peak Flow --      Pain Score 04/30/22 0133 2     Pain Loc --      Pain Edu? --      Excl. in [place]? --     Most recent vital signs: Vitals:   04/30/22 0132 04/30/22 0441  BP: (!) 146/79 (!) 141/67  Pulse: 63 (!) 52  Resp: 14 17  Temp: (!) 97.5 F (36.4 C) (!) 97.5 F (36.4 C)  SpO2: 100% 100%    CONSTITUTIONAL: Alert and oriented and responds appropriately to questions. Well-appearing; well-nourished HEAD: Normocephalic, atraumatic EYES: Conjunctivae clear, pupils appear equal, sclera nonicteric ENT: normal nose; moist mucous membranes NECK: Supple, normal ROM CARD: RRR; S1 and S2 appreciated; no murmurs, no clicks, no rubs, no gallops RESP: Normal chest excursion without splinting or tachypnea; breath sounds clear and equal bilaterally; no wheezes, no rhonchi, no rales, no hypoxia or respiratory distress, speaking full sentences ABD/GI:  Normal bowel sounds; non-distended; soft, non-tender, no rebound, no guarding, no peritoneal signs BACK: The back appears normal EXT: Normal ROM in all joints; no deformity noted, no edema; no cyanosis, no calf tenderness or calf swelling, left 2+ DP pulse, no redness or increased warmth of the left leg, compartments soft, no joint effusion SKIN: Normal color for age and race; warm; no rash on exposed skin NEURO: Moves all extremities equally, normal speech ambulates with steady gait PSYCH: The patient's mood and manner are appropriate.   ED Results / Procedures / Treatments   LABS: (all labs ordered are listed, but only abnormal results are displayed) Labs Reviewed  BASIC METABOLIC PANEL - Abnormal; Notable for the following components:      Result Value   Glucose, Bld 106 (*)    Creatinine, Ser 1.37 (*)     GFR, Estimated 48 (*)    All other components within normal limits  CBC - Abnormal; Notable for the following components:   MCV 100.5 (*)    All other components within normal limits  D-DIMER, QUANTITATIVE - Abnormal; Notable for the following components:   D-Dimer, Quant 0.65 (*)    All other components within normal limits     EKG:   RADIOLOGY: My personal review and interpretation of imaging: Doppler shows acute DVT in the femoral vein and also possible acute DVT in the popliteal vein compared to August 2022.  I have personally reviewed all radiology reports.   US Venous Img Lower Unilateral Left  Result Date: 04/30/2022 CLINICAL DATA:  Left leg pain and swelling EXAM: LEFT LOWER EXTREMITY VENOUS DOPPLER ULTRASOUND TECHNIQUE: Gray-scale sonography with graded compression, as well as color Doppler and duplex ultrasound were performed to evaluate the lower extremity deep venous systems from the level of the common femoral vein and including the common femoral, femoral, profunda femoral, popliteal and calf veins including the posterior tibial, peroneal and gastrocnemius veins when visible. The superficial great saphenous vein was also interrogated. Spectral Doppler was utilized to evaluate flow at rest and with distal augmentation maneuvers in the common femoral, femoral and popliteal veins. COMPARISON:  None Available. FINDINGS: Contralateral Common Femoral Vein: Respiratory phasicity is normal and symmetric with the symptomatic side. No evidence of thrombus. Normal compressibility. Common Femoral Vein: No evidence of thrombus. Normal compressibility, respiratory phasicity and response to augmentation. Saphenofemoral Junction: No evidence of thrombus. Normal compressibility and flow on color Doppler imaging. Profunda Femoral Vein: No evidence of thrombus. Normal compressibility and flow on color Doppler imaging. Femoral Vein: Thrombus is noted in the peripheral femoral vein with decreased  compressibility. Popliteal Vein: Thrombus is noted within the popliteal vein with decreased compressibility. Calf Veins: No evidence of thrombus. Normal compressibility and flow on color Doppler imaging. Superficial Great Saphenous Vein: No evidence of thrombus. Normal compressibility. Venous Reflux:  None. Other Findings: Complex cystic area is noted in the left popliteal fossa measuring 7.8 x 2.7 x 4.2 cm. IMPRESSION: Thrombus is noted within the femoral and popliteal vein with decreased compressibility. Left popliteal fossa cyst. Electronically Signed   By: Inez Catalina M.D.   On: 04/30/2022 03:41     PROCEDURES:  Critical Care performed: No   Procedures    IMPRESSION / MDM / ASSESSMENT AND PLAN / ED COURSE  I reviewed the triage vital signs and the nursing notes.    Patient here with recurrent DVT, PE with complaints of left leg swelling that has improved.     DIFFERENTIAL DIAGNOSIS (includes but not limited  to):   DVT, superficial thrombophlebitis, varicose vein, less likely cellulitis, abscess.  No sign of compartment syndrome, septic arthritis, gout, fracture.   Patient's presentation is most consistent with acute presentation with potential threat to life or bodily function.   PLAN: CBC, BMP, D-dimer obtained from triage.  Will obtain venous Doppler.   MEDICATIONS GIVEN IN ED: Medications  enoxaparin (LOVENOX) injection 67.5 mg (67.5 mg Subcutaneous Given 04/30/22 0447)     ED COURSE: Patient's labs show no leukocytosis, normal hemoglobin.  Stable chronic kidney disease.  D-dimer slightly elevated.  No chest pain or shortness of breath.  Doppler reviewed and interpreted by myself and the radiologist and shows acute DVT in the left femoral vein that is nonocclusive and also in the popliteal vein.  Patient reports compliance with Eliquis twice daily.  He has never had a hypercoagulable workup before.  Have offered him admission to the hospital to see vascular surgery and  hematology here and is being restarted on heparin but patient would prefer for this to be done as an outpatient.  Will start him on Lovenox and demonstrate how to administer that here in the ED with his first dose.  I have placed a referral to hematology for close follow-up.  He has already seen Dr. Lucky Cowboy with vascular surgery before and recommended that he call on Monday for an appointment.  Also recommend close follow-up with his PCP.  He has no sign of cerulea dolens and has a strong peripheral pulse.  He is able to ambulate without any difficulty.  Patient and family comfortable with this plan.   CONSULTS: Admission offered but patient declined stating he wanted outpatient follow-up, workup.   OUTSIDE RECORDS REVIEWED: Reviewed last cardiology note on 04/26/2022 and last PCP note on 03/30/2022.       FINAL CLINICAL IMPRESSION(S) / ED DIAGNOSES   Final diagnoses:  Acute deep vein thrombosis (DVT) of femoral vein of left lower extremity (HCC)  Acute deep vein thrombosis (DVT) of popliteal vein of left lower extremity [facility])     Rx / DC Orders   ED Discharge Orders          Ordered    Ambulatory referral to Hematology / Oncology        04/30/22 0412    enoxaparin (LOVENOX) 80 MG/0.8ML injection  Every 12 hours        04/30/22 0430             Note:  This document was prepared using Dragon voice recognition software and may include unintentional dictation errors.   Daina Cara, Delice Bison, DO 04/30/22 [phone]

## 2022-04-30 NOTE — ED Triage Notes (Addendum)
Pt comes from home via POV c/o lower leg swelling and pain. Pt states he woke up to pain in his left ankle and noted his lower leg was swollen. Pt has hx of blood clots and states he wanted to make sure he didn't have one. Leg not swollen at this time. Pt in NAD, denies CP, SOB

## 2022-04-30 NOTE — Discharge Instructions (Signed)
Please stop taking your Eliquis.  I recommend starting Lovenox injections twice a day and following up closely with your doctor at [facility] family practice, Dr. Lucky Cowboy with vascular surgery and [facility] hematology/oncology.  If you develop any worsening pain or swelling in your left leg, chest pain or shortness of breath, please return to the emergency department immediately.

## 2022-04-30 NOTE — Telephone Encounter (Signed)
Patient was seen in office and has been scheduled for a LLE thrombectomy with Dr. Lucky Cowboy on 05/03/22 with a 9:45 [facility]. Pre-procedure instructions were discussed and handed to the patient.

## 2022-05-01 ENCOUNTER — Encounter (INDEPENDENT_AMBULATORY_CARE_PROVIDER_SITE_OTHER): Payer: Self-pay | Admitting: Nurse Practitioner

## 2022-05-01 NOTE — H&P (View-Only) (Signed)
Subjective:    Patient ID: Matthew Clayton, male    DOB: Feb 04, 1927, 88 y.o.   MRN: [id number] Chief Complaint  Patient presents with   Follow-up    Followe up from ED    Matthew Clayton is a 88 year old male who presented to [facility] on 04/30/2022 for concern of possible DVT.  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  He also had bilateral pulmonary embolisms for which he underwent a pulmonary thrombectomy.  Following that time the patient was on Eliquis 5 mg twice daily consistently since that time.  He denies missing any doses.  He denies any recent surgeries.  He denies any prolonged trips.  On the day that he presented to the emergency room he began to feel some pain and discomfort near his left ankle area with a bulging varicosity.  This was concerning which prompted his emergency room visit.  The patient noted a DVT within the left popliteal and calf vessels.  With the follow-up studies on 04/30/2022 it was noted that he had a DVT within the popliteal vein extending to the femoral, a clear propagation.  He was placed on Lovenox in the ED.  It was also found that the patient had a substantial sized Baker's cyst in the left popliteal space.    Review of Systems  Cardiovascular:  Positive for leg swelling.  All other systems reviewed and are negative.      Objective:   Physical Exam Vitals reviewed.  HENT:     Head: Normocephalic.  Cardiovascular:     Rate and Rhythm: Normal rate.     Pulses: Normal pulses.  Pulmonary:     Effort: Pulmonary effort is normal.  Musculoskeletal:     Left lower leg: Edema present.  Skin:    General: Skin is warm and dry.  Neurological:     Mental Status: He is alert and oriented to person, place, and time.  Psychiatric:        Mood and Affect: Mood normal.        Behavior: Behavior normal.        Thought Content: Thought content normal.        Judgment: Judgment normal.     BP 103/66 (BP Location:  Right Arm)   Pulse 66   Resp 16   Wt 150 lb (68 kg)   BMI 24.96 kg/m   Past Medical History:  Diagnosis Date   DVT (deep venous thrombosis) (HCC)    left leg   Hyperlipidemia     Social History   Socioeconomic History   Marital status: Married    Spouse name: Gwinda Passe   Number of children: 4   Years of education: bachelors   Highest education level: Bachelor's degree (e.g., BA, AB, BS)  Occupational History   Occupation: Retired  Tobacco Use   Smoking status: Former    Packs/day: 1.50    Years: 20.00    Total pack years: 30.00    Types: Cigarettes    Quit date: 05/16/1960    Years since quitting: 62.0   Smokeless tobacco: Never   Tobacco comments:    quit in 1968  Substance and Sexual Activity   Alcohol use: No    Alcohol/week: 0.0 standard drinks of alcohol   Drug use: No   Sexual activity: Not on file  Other Topics Concern   Not on file  Social History Narrative   Not on file   Social Determinants of Health  Financial Resource Strain: Low Risk  (06/30/2021)   Overall Financial Resource Strain (CARDIA)    Difficulty of Paying Living Expenses: Not hard at all  Food Insecurity: No Food Insecurity (06/30/2021)   Hunger Vital Sign    Worried About Running Out of Food in the Last Year: Never true    Ran Out of Food in the Last Year: Never true  Transportation Needs: No Transportation Needs (06/30/2021)   PRAPARE - Hydrologist (Medical): No    Lack of Transportation (Non-Medical): No  Physical Activity: Inactive (06/30/2021)   Exercise Vital Sign    Days of Exercise per Week: 0 days    Minutes of Exercise per Session: 0 min  Stress: No Stress Concern Present (06/30/2021)   [place]    Feeling of Stress : Not at all  Social Connections: Moderately Integrated (06/30/2021)   Social Connection and Isolation Panel [NHANES]    Frequency of Communication with Friends and  Family: Twice a week    Frequency of Social Gatherings with Friends and Family: More than three times a week    Attends Religious Services: More than 4 times per year    Active Member of [employer] or Organizations: No    Attends Archivist Meetings: Never    Marital Status: Married  Human resources officer Violence: Not At Risk (06/30/2021)   Humiliation, Afraid, Rape, and Kick questionnaire    Fear of Current or Ex-Partner: No    Emotionally Abused: No    Physically Abused: No    Sexually Abused: No    Past Surgical History:  Procedure Laterality Date   APPENDECTOMY     MOLE REMOVAL     PACEMAKER LEADLESS INSERTION N/A 12/09/2020   Procedure: PACEMAKER LEADLESS INSERTION;  Surgeon: Isaias Cowman, MD;  Location: [facility];  Service: Cardiovascular;  Laterality: N/A;   PULMONARY THROMBECTOMY Bilateral 01/12/2021   Procedure: PULMONARY THROMBECTOMY;  Surgeon: Algernon Huxley, MD;  Location: [facility];  Service: Cardiovascular;  Laterality: Bilateral;   TONSILLECTOMY AND ADENOIDECTOMY     TOTAL HIP ARTHROPLASTY Bilateral     Family History  Problem Relation Age of Onset   Breast cancer Mother    Cancer Mother        eye cancer   Heart disease Father    Breast cancer Sister    Stroke Brother    Heart attack Brother    Breast cancer Sister    Aneurysm Sister     No Known Allergies     Latest Ref Rng & Units 04/30/2022    1:39 AM 03/30/2022   11:21 AM 10/11/2021    4:01 PM  CBC  WBC 4.0 - 10.5 K/uL 8.7  8.5  11.0   Hemoglobin 13.0 - 17.0 g/dL 13.8  13.7  13.2   Hematocrit 39.0 - 52.0 % 43.1  40.2  40.1   Platelets 150 - 400 K/uL 207  CANCELED  199       CMP     Component Value Date/Time   NA 140 04/30/2022 0139   NA 140 03/30/2022 1121   K 4.0 04/30/2022 0139   CL 106 04/30/2022 0139   CO2 26 04/30/2022 0139   GLUCOSE 106 (H) 04/30/2022 0139   BUN 23 04/30/2022 0139   BUN 17 03/30/2022 1121   CREATININE 1.37 (H) 04/30/2022 0139    CALCIUM 9.6 04/30/2022 0139   PROT 7.2 03/30/2022 1121  ALBUMIN 4.3 03/30/2022 1121   AST 18 03/30/2022 1121   ALT 14 03/30/2022 1121   ALKPHOS 72 03/30/2022 1121   BILITOT 0.5 03/30/2022 1121   GFRNONAA 48 (L) 04/30/2022 0139   GFRAA 47 (L) 09/13/2019 1141     No results found.     Assessment & Plan:   1. Deep vein thrombosis (DVT) of proximal vein of left lower extremity, unspecified chronicity (HCC) Given that there has been clear propagation of the DVT from recent studies, we feel there is a likely failure of Eliquis.  The patient will continue with Lovenox.  We have placed an urgent referral to hematology for evaluation of possible hypercoagulable disorders or to determine if he should continue with Lovenox versus Xarelto.  We discussed conservative therapy versus thrombectomy and the patient wishes to proceed with a lower extremity venous thrombectomy. - Ambulatory referral to Hematology / Oncology  2. Mixed hyperlipidemia Continue statin as ordered and reviewed, no changes at this time  3. Essential hypertension Continue antihypertensive medications as already ordered, these medications have been reviewed and there are no changes at this time.  4. Synovial cyst of left popliteal space There is concern that the large size of the popliteal cyst the cause some venous compression this causing a thrombus.  We will send the patient to orthopedic surgery for evaluation of this. - Ambulatory referral to Orthopedic Surgery   Current Outpatient Medications on File Prior to Visit  Medication Sig Dispense Refill   cetirizine (ZYRTEC) 10 MG tablet Take 1 tablet (10 mg total) by mouth daily. 30 tablet 11   ELIQUIS 5 MG TABS tablet TAKE 2 TABLETS (10 MG TOTAL) BY MOUTH 2 (TWO) TIMES DAILY. AND FROM 01/20/2021 TAKE 5 MG TWICE A DAY 60 tablet 3   Multiple Vitamin (MULTIVITAMIN PO) Take by mouth daily.     simvastatin (ZOCOR) 40 MG tablet TAKE 1 TABLET BY MOUTH EVERY DAY 90 tablet 2   No  current facility-administered medications on file prior to visit.    There are no Patient Instructions on file for this visit. No follow-ups on file.   Kris Hartmann, NP

## 2022-05-01 NOTE — Progress Notes (Signed)
Subjective:    Patient ID: Matthew Clayton, male    DOB: [birth year], 88 y.o.   MRN: [id number] Chief Complaint  Patient presents with   Follow-up    Followe up from ED    Matthew Clayton is a 88 year old male who presented to [facility] on 04/30/2022 for concern of possible DVT.  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  He also had bilateral pulmonary embolisms for which he underwent a pulmonary thrombectomy.  Following that time the patient was on Eliquis 5 mg twice daily consistently since that time.  He denies missing any doses.  He denies any recent surgeries.  He denies any prolonged trips.  On the day that he presented to the emergency room he began to feel some pain and discomfort near his left ankle area with a bulging varicosity.  This was concerning which prompted his emergency room visit.  The patient noted a DVT within the left popliteal and calf vessels.  With the follow-up studies on 04/30/2022 it was noted that he had a DVT within the popliteal vein extending to the femoral, a clear propagation.  He was placed on Lovenox in the ED.  It was also found that the patient had a substantial sized Baker's cyst in the left popliteal space.    Review of Systems  Cardiovascular:  Positive for leg swelling.  All other systems reviewed and are negative.      Objective:   Physical Exam Vitals reviewed.  HENT:     Head: Normocephalic.  Cardiovascular:     Rate and Rhythm: Normal rate.     Pulses: Normal pulses.  Pulmonary:     Effort: Pulmonary effort is normal.  Musculoskeletal:     Left lower leg: Edema present.  Skin:    General: Skin is warm and dry.  Neurological:     Mental Status: He is alert and oriented to person, place, and time.  Psychiatric:        Mood and Affect: Mood normal.        Behavior: Behavior normal.        Thought Content: Thought content normal.        Judgment: Judgment normal.     BP 103/66 (BP Location:  Right Arm)   Pulse 66   Resp 16   Wt 150 lb (68 kg)   BMI 24.96 kg/m   Past Medical History:  Diagnosis Date   DVT (deep venous thrombosis) (HCC)    left leg   Hyperlipidemia     Social History   Socioeconomic History   Marital status: Married    Spouse name: Gwinda Passe   Number of children: 4   Years of education: bachelors   Highest education level: Bachelor's degree (e.g., BA, AB, BS)  Occupational History   Occupation: Retired  Tobacco Use   Smoking status: Former    Packs/day: 1.50    Years: 20.00    Total pack years: 30.00    Types: Cigarettes    Quit date: 05/16/1960    Years since quitting: 62.0   Smokeless tobacco: Never   Tobacco comments:    quit in 1968  Substance and Sexual Activity   Alcohol use: No    Alcohol/week: 0.0 standard drinks of alcohol   Drug use: No   Sexual activity: Not on file  Other Topics Concern   Not on file  Social History Narrative   Not on file   Social Determinants of Health  Financial Resource Strain: Low Risk  (06/30/2021)   Overall Financial Resource Strain (CARDIA)    Difficulty of Paying Living Expenses: Not hard at all  Food Insecurity: No Food Insecurity (06/30/2021)   Hunger Vital Sign    Worried About Running Out of Food in the Last Year: Never true    Ran Out of Food in the Last Year: Never true  Transportation Needs: No Transportation Needs (06/30/2021)   PRAPARE - Hydrologist (Medical): No    Lack of Transportation (Non-Medical): No  Physical Activity: Inactive (06/30/2021)   Exercise Vital Sign    Days of Exercise per Week: 0 days    Minutes of Exercise per Session: 0 min  Stress: No Stress Concern Present (06/30/2021)   [place]    Feeling of Stress : Not at all  Social Connections: Moderately Integrated (06/30/2021)   Social Connection and Isolation Panel [NHANES]    Frequency of Communication with Friends and  Family: Twice a week    Frequency of Social Gatherings with Friends and Family: More than three times a week    Attends Religious Services: More than 4 times per year    Active Member of [employer] or Organizations: No    Attends Archivist Meetings: Never    Marital Status: Married  Human resources officer Violence: Not At Risk (06/30/2021)   Humiliation, Afraid, Rape, and Kick questionnaire    Fear of Current or Ex-Partner: No    Emotionally Abused: No    Physically Abused: No    Sexually Abused: No    Past Surgical History:  Procedure Laterality Date   APPENDECTOMY     MOLE REMOVAL     PACEMAKER LEADLESS INSERTION N/A 12/09/2020   Procedure: PACEMAKER LEADLESS INSERTION;  Surgeon: Isaias Cowman, MD;  Location: [facility];  Service: Cardiovascular;  Laterality: N/A;   PULMONARY THROMBECTOMY Bilateral 01/12/2021   Procedure: PULMONARY THROMBECTOMY;  Surgeon: Algernon Huxley, MD;  Location: [facility];  Service: Cardiovascular;  Laterality: Bilateral;   TONSILLECTOMY AND ADENOIDECTOMY     TOTAL HIP ARTHROPLASTY Bilateral     Family History  Problem Relation Age of Onset   Breast cancer Mother    Cancer Mother        eye cancer   Heart disease Father    Breast cancer Sister    Stroke Brother    Heart attack Brother    Breast cancer Sister    Aneurysm Sister     No Known Allergies     Latest Ref Rng & Units 04/30/2022    1:39 AM 03/30/2022   11:21 AM 10/11/2021    4:01 PM  CBC  WBC 4.0 - 10.5 K/uL 8.7  8.5  11.0   Hemoglobin 13.0 - 17.0 g/dL 13.8  13.7  13.2   Hematocrit 39.0 - 52.0 % 43.1  40.2  40.1   Platelets 150 - 400 K/uL 207  CANCELED  199       CMP     Component Value Date/Time   NA 140 04/30/2022 0139   NA 140 03/30/2022 1121   K 4.0 04/30/2022 0139   CL 106 04/30/2022 0139   CO2 26 04/30/2022 0139   GLUCOSE 106 (H) 04/30/2022 0139   BUN 23 04/30/2022 0139   BUN 17 03/30/2022 1121   CREATININE 1.37 (H) 04/30/2022 0139    CALCIUM 9.6 04/30/2022 0139   PROT 7.2 03/30/2022 1121  ALBUMIN 4.3 03/30/2022 1121   AST 18 03/30/2022 1121   ALT 14 03/30/2022 1121   ALKPHOS 72 03/30/2022 1121   BILITOT 0.5 03/30/2022 1121   GFRNONAA 48 (L) 04/30/2022 0139   GFRAA 47 (L) 09/13/2019 1141     No results found.     Assessment & Plan:   1. Deep vein thrombosis (DVT) of proximal vein of left lower extremity, unspecified chronicity (HCC) Given that there has been clear propagation of the DVT from recent studies, we feel there is a likely failure of Eliquis.  The patient will continue with Lovenox.  We have placed an urgent referral to hematology for evaluation of possible hypercoagulable disorders or to determine if he should continue with Lovenox versus Xarelto.  We discussed conservative therapy versus thrombectomy and the patient wishes to proceed with a lower extremity venous thrombectomy. - Ambulatory referral to Hematology / Oncology  2. Mixed hyperlipidemia Continue statin as ordered and reviewed, no changes at this time  3. Essential hypertension Continue antihypertensive medications as already ordered, these medications have been reviewed and there are no changes at this time.  4. Synovial cyst of left popliteal space There is concern that the large size of the popliteal cyst the cause some venous compression this causing a thrombus.  We will send the patient to orthopedic surgery for evaluation of this. - Ambulatory referral to Orthopedic Surgery   Current Outpatient Medications on File Prior to Visit  Medication Sig Dispense Refill   cetirizine (ZYRTEC) 10 MG tablet Take 1 tablet (10 mg total) by mouth daily. 30 tablet 11   ELIQUIS 5 MG TABS tablet TAKE 2 TABLETS (10 MG TOTAL) BY MOUTH 2 (TWO) TIMES DAILY. AND FROM 01/20/2021 TAKE 5 MG TWICE A DAY 60 tablet 3   Multiple Vitamin (MULTIVITAMIN PO) Take by mouth daily.     simvastatin (ZOCOR) 40 MG tablet TAKE 1 TABLET BY MOUTH EVERY DAY 90 tablet 2   No  current facility-administered medications on file prior to visit.    There are no Patient Instructions on file for this visit. No follow-ups on file.   Kris Hartmann, NP

## 2022-05-03 ENCOUNTER — Encounter: Payer: Self-pay | Admitting: Vascular Surgery

## 2022-05-03 ENCOUNTER — Other Ambulatory Visit: Payer: Self-pay

## 2022-05-03 ENCOUNTER — Ambulatory Visit
Admission: RE | Admit: 2022-05-03 | Discharge: 2022-05-03 | Disposition: A | Discharge status: Home / Self Care | Payer: PPO | Attending: Vascular Surgery | Admitting: Vascular Surgery

## 2022-05-03 ENCOUNTER — Encounter
Admission: RE | Disposition: A | Discharge status: Home / Self Care | Payer: Self-pay | Source: Home / Self Care | Attending: Vascular Surgery

## 2022-05-03 DIAGNOSIS — M7122 Synovial cyst of popliteal space [Baker], left knee: Secondary | ICD-10-CM | POA: Diagnosis not present

## 2022-05-03 DIAGNOSIS — Z87891 Personal history of nicotine dependence: Secondary | ICD-10-CM | POA: Insufficient documentation

## 2022-05-03 DIAGNOSIS — Z7901 Long term (current) use of anticoagulants: Secondary | ICD-10-CM | POA: Insufficient documentation

## 2022-05-03 DIAGNOSIS — I10 Essential (primary) hypertension: Secondary | ICD-10-CM | POA: Insufficient documentation

## 2022-05-03 DIAGNOSIS — I82432 Acute embolism and thrombosis of left popliteal vein: Secondary | ICD-10-CM | POA: Diagnosis not present

## 2022-05-03 DIAGNOSIS — I82412 Acute embolism and thrombosis of left femoral vein: Secondary | ICD-10-CM

## 2022-05-03 DIAGNOSIS — Z86711 Personal history of pulmonary embolism: Secondary | ICD-10-CM | POA: Insufficient documentation

## 2022-05-03 DIAGNOSIS — E782 Mixed hyperlipidemia: Secondary | ICD-10-CM | POA: Diagnosis not present

## 2022-05-03 DIAGNOSIS — I871 Compression of vein: Secondary | ICD-10-CM | POA: Diagnosis not present

## 2022-05-03 HISTORY — PX: PERIPHERAL VASCULAR THROMBECTOMY: CATH118306

## 2022-05-03 HISTORY — DX: Other specified joint disorders, left knee: M25.862

## 2022-05-03 LAB — CREATININE, SERUM
Creatinine, Ser: 1.2 mg/dL (ref 0.61–1.24)
GFR, Estimated: 56 mL/min — ABNORMAL LOW (ref >60)

## 2022-05-03 LAB — BUN: BUN: 20 mg/dL (ref 8–23)

## 2022-05-03 SURGERY — PERIPHERAL VASCULAR THROMBECTOMY
Anesthesia: Moderate Sedation | Laterality: Left

## 2022-05-03 MED ORDER — HEPARIN SODIUM (PORCINE) 1000 UNIT/ML IJ SOLN
INTRAMUSCULAR | Status: AC
  Filled 2022-05-03: qty 10

## 2022-05-03 MED ORDER — MIDAZOLAM HCL 2 MG/ML PO SYRP: 8.0000 mg | ORAL_SOLUTION | Freq: Once | ORAL | Status: DC | PRN

## 2022-05-03 MED ORDER — SODIUM CHLORIDE 0.9 % IV SOLN: INTRAVENOUS | Status: DC

## 2022-05-03 MED ORDER — MIDAZOLAM HCL 2 MG/2ML IJ SOLN
INTRAMUSCULAR | Status: AC
  Filled 2022-05-03: qty 2

## 2022-05-03 MED ORDER — MIDAZOLAM HCL 2 MG/2ML IJ SOLN
INTRAMUSCULAR | Status: DC | PRN
  Administered 2022-05-03 (×3): 1 mg via INTRAVENOUS

## 2022-05-03 MED ORDER — HEPARIN SODIUM (PORCINE) 1000 UNIT/ML IJ SOLN
INTRAMUSCULAR | Status: DC | PRN
  Administered 2022-05-03: 3000 [IU] via INTRAVENOUS

## 2022-05-03 MED ORDER — METHYLPREDNISOLONE SODIUM SUCC 125 MG IJ SOLR
125.0000 mg | Freq: Once | INTRAMUSCULAR | Status: DC | PRN
Start: 2022-05-03 — End: 2022-05-03

## 2022-05-03 MED ORDER — HYDROMORPHONE HCL 1 MG/ML IJ SOLN: 1.0000 mg | Freq: Once | INTRAMUSCULAR | Status: DC | PRN

## 2022-05-03 MED ORDER — FAMOTIDINE 20 MG PO TABS: 40.0000 mg | ORAL_TABLET | Freq: Once | ORAL | Status: DC | PRN

## 2022-05-03 MED ORDER — FENTANYL CITRATE (PF) 100 MCG/2ML IJ SOLN
INTRAMUSCULAR | Status: AC
  Filled 2022-05-03: qty 2

## 2022-05-03 MED ORDER — FENTANYL CITRATE (PF) 100 MCG/2ML IJ SOLN
INTRAMUSCULAR | Status: DC | PRN
  Administered 2022-05-03 (×2): 50 ug via INTRAVENOUS

## 2022-05-03 MED ORDER — CEFAZOLIN SODIUM-DEXTROSE 2-4 GM/100ML-% IV SOLN: 2.0000 g | INTRAVENOUS | Status: AC

## 2022-05-03 MED ORDER — ONDANSETRON HCL 4 MG/2ML IJ SOLN: 4.0000 mg | Freq: Four times a day (QID) | INTRAMUSCULAR | Status: DC | PRN

## 2022-05-03 MED ORDER — IODIXANOL 320 MG/ML IV SOLN
INTRAVENOUS | Status: DC | PRN
  Administered 2022-05-03: 40 mL

## 2022-05-03 MED ORDER — CEFAZOLIN SODIUM-DEXTROSE 2-4 GM/100ML-% IV SOLN
INTRAVENOUS | Status: AC
  Administered 2022-05-03: 2 g via INTRAVENOUS
  Filled 2022-05-03: qty 100

## 2022-05-03 MED ORDER — DIPHENHYDRAMINE HCL 50 MG/ML IJ SOLN
50.0000 mg | Freq: Once | INTRAMUSCULAR | Status: DC | PRN
Start: 2022-05-03 — End: 2022-05-03

## 2022-05-03 SURGICAL SUPPLY — 19 items
BALLN ARMADA 12X80X80 (BALLOONS) ×1
BALLN ARMADA 14X80X80 (BALLOONS) ×1
BALLN DORADO 10X80X80 (BALLOONS) ×1
BALLOON ARMADA 12X80X80 (BALLOONS) IMPLANT
BALLOON ARMADA 14X80X80 (BALLOONS) IMPLANT
BALLOON DORADO 10X80X80 (BALLOONS) IMPLANT
CANISTER PENUMBRA ENGINE (MISCELLANEOUS) IMPLANT
CATH INDIGO 12XTORQ 100 (CATHETERS) IMPLANT
CLOSURE PERCLOSE PROSTYLE (VASCULAR PRODUCTS) IMPLANT
KIT ENCORE 26 ADVANTAGE (KITS) IMPLANT
KIT MICROPUNCTURE NIT STIFF (SHEATH) IMPLANT
NDL ENTRY 21GA 7CM ECHOTIP (NEEDLE) IMPLANT
NEEDLE ENTRY 21GA 7CM ECHOTIP (NEEDLE) ×1 IMPLANT
PACK ANGIOGRAPHY (CUSTOM PROCEDURE TRAY) ×1 IMPLANT
SHEATH BRITE TIP 6FRX11 (SHEATH) IMPLANT
SHEATH PINNACLE 11FRX10 (SHEATH) IMPLANT
SUT MNCRL AB 4-0 PS2 18 (SUTURE) IMPLANT
WIRE GUIDERIGHT .035X150 (WIRE) IMPLANT
WIRE SUPRACORE 190CM (MISCELLANEOUS) IMPLANT

## 2022-05-03 NOTE — Op Note (Signed)
[facility]   OPERATIVE NOTE   PRE-OPERATIVE DIAGNOSIS: Right lower extremity DVT  POST-OPERATIVE DIAGNOSIS: same   PROCEDURE: 1.   US guidance for vascular access to right popliteal vein 2.   IVC gram and right lower extremity venogram 3.   Mechanical thrombectomy to right popliteal vein, superficial femoral vein, and common femoral vein with the penumbra CAT 12 device 4.   PTA of the right common femoral vein with 10, 12, and 14 mm balloons    SURGEON: Leotis Pain, MD  ASSISTANT(S): none  ANESTHESIA: local with moderate conscious sedation for 42 minutes using 3 mg of Versed and 100 mcg of Fentanyl  ESTIMATED BLOOD LOSS: 100 cc  FINDING(S): 1.  Thrombus in popliteal vein and distal superficial femoral vein.  Much of the superficial femoral vein in the thigh was then patent and then there was narrowing and thrombus in the common femoral vein.  The right external and common iliac veins and IVC were widely patent.  SPECIMEN(S):  none  INDICATIONS:    Patient is a 88 y.o. male who presents with right lower extremity DVT.  Patient has marked leg swelling and pain.  Venous intervention is performed to reduce the symtpoms and avoid long term postphlebitic symptoms.    DESCRIPTION: After obtaining full informed written consent, the patient was brought back to the vascular suite and placed supine upon the table. Moderate conscious sedation was administered during a face to face encounter with the patient throughout the procedure with my supervision of the RN administering medicines and monitoring the patient's vital signs, pulse oximetry, telemetry and mental status throughout from the start of the procedure until the patient was taken to the recovery room.  After obtaining adequate anesthesia, the patient was prepped and draped in the standard fashion.    The patient was then placed into the prone position.  The right popliteal vein was then accessed under direct  ultrasound guidance without difficulty with a micropuncture needle and a permanent image was recorded.  I then placed a ProGlide device in a preclose fashion.  I then upsized to an 11 Fr sheath over a J wire.  3000 units of heparin were then given.  Imaging  was performed through the sheath. Thrombus in popliteal vein and distal superficial femoral vein.  Much of the superficial femoral vein in the thigh was then patent and then there was narrowing and thrombus in the common femoral vein.  The right external and common iliac veins and IVC were widely patent.  I used the Penumbra Cat 12 catheter and evacuated about 100 cc of effluent with mechanical thrombectomy throughout the popliteal vein, distal superficial femoral vein, and common femoral vein.  This resulted in significant improvement in the thrombosis, but there was a significant narrowing in the common femoral vein right beside his hip prosthesis.  I then treated this area first with a 10 mm x 8 cm length angioplasty balloon inflated to 8 atm.  There was a waist that resolved at 8 atm but on completion imaging there still appeared to be a greater than 50% stenosis.  I then upsized to 12 and 14 mm balloons and inflated these to 6 atm.  There is not much waist with these inflations but there remains some residual stenosis after the 12 but after the 14 mm balloon this appeared to be less than 40%.  I then elected to terminate the procedure.  The sheath was removed and the Pro-glide device was secured  with good hemostatic result and a dressing was placed.  He was taken to the recovery room in stable condition having tolerated the procedure well.    COMPLICATIONS: None  CONDITION: Stable  Leotis Pain 05/03/2022 11:47 AM

## 2022-05-03 NOTE — Interval H&P Note (Signed)
History and Physical Interval Note:  05/03/2022 9:51 AM  Matthew Clayton  has presented today for surgery, with the diagnosis of LLE Thrombectomy   DVT.  The various methods of treatment have been discussed with the patient and family. After consideration of risks, benefits and other options for treatment, the patient has consented to  Procedure(s): PERIPHERAL VASCULAR THROMBECTOMY (Left) as a surgical intervention.  The patient's history has been reviewed, patient examined, no change in status, stable for surgery.  I have reviewed the patient's chart and labs.  Questions were answered to the patient's satisfaction.     Leotis Pain

## 2022-05-14 ENCOUNTER — Encounter: Payer: Self-pay | Admitting: Oncology

## 2022-05-14 ENCOUNTER — Inpatient Hospital Stay: Payer: PPO | Attending: Oncology | Admitting: Oncology

## 2022-05-14 ENCOUNTER — Inpatient Hospital Stay: Payer: PPO

## 2022-05-14 VITALS — BP 114/77 | HR 67 | Temp 97.8°F | Resp 16 | Ht 65.0 in | Wt 153.2 lb

## 2022-05-14 DIAGNOSIS — Z87891 Personal history of nicotine dependence: Secondary | ICD-10-CM | POA: Diagnosis not present

## 2022-05-14 DIAGNOSIS — Z7901 Long term (current) use of anticoagulants: Secondary | ICD-10-CM | POA: Insufficient documentation

## 2022-05-14 DIAGNOSIS — Z86711 Personal history of pulmonary embolism: Secondary | ICD-10-CM | POA: Insufficient documentation

## 2022-05-14 DIAGNOSIS — I824Y3 Acute embolism and thrombosis of unspecified deep veins of proximal lower extremity, bilateral: Secondary | ICD-10-CM | POA: Diagnosis not present

## 2022-05-14 DIAGNOSIS — Z86718 Personal history of other venous thrombosis and embolism: Secondary | ICD-10-CM | POA: Insufficient documentation

## 2022-05-14 MED ORDER — RIVAROXABAN 20 MG PO TABS: 20.0000 mg | ORAL_TABLET | Freq: Every day | ORAL | 3 refills | Status: DC

## 2022-05-14 NOTE — Progress Notes (Signed)
[place]  [phone] [phone]  ID: Evelina Dun OB: 1926-05-22  MR#: [id number]  ERD#:[id number]  Patient Care Team: Eulis Foster, MD as PCP - General (Family Medicine) Delana Meyer, Dolores Lory, MD (Vascular Surgery) Lorelee Cover., MD (Ophthalmology)  CHIEF COMPLAINT: Recurrent PE/DVT  INTERVAL HISTORY: Patient last evaluated in clinic in September 2022.  He is referred back for further evaluation after having extensive right leg DVT while taking Eliquis.  Patient had thrombectomy and was on Lovenox briefly, but has since been placed back on Eliquis.  He is here to discuss treatment options. He has no neurologic complaints.  He has a good appetite and denies weight loss.  He denies any chest pain, shortness of breath, cough, or hemoptysis.  He has no nausea, vomiting, constipation, or diarrhea.  He has no urinary complaints.  Patient offers no complaints today.  REVIEW OF SYSTEMS:   Review of Systems  Constitutional: Negative.  Negative for fever, malaise/fatigue and weight loss.  Respiratory: Negative.  Negative for cough, hemoptysis and shortness of breath.   Cardiovascular: Negative.  Negative for chest pain and leg swelling.  Gastrointestinal: Negative.  Negative for abdominal pain.  Genitourinary: Negative.  Negative for dysuria.  Musculoskeletal: Negative.  Negative for back pain.  Skin: Negative.  Negative for rash.  Neurological: Negative.  Negative for dizziness, focal weakness, weakness and headaches.  Endo/Heme/Allergies:  Does not bruise/bleed easily.  Psychiatric/Behavioral: Negative.  The patient is not nervous/anxious.     As per HPI. Otherwise, a complete review of systems is negative.  PAST MEDICAL HISTORY: Past Medical History:  Diagnosis Date   Cyst of knee joint, left    DVT (deep venous thrombosis) ([place])    left leg   Hyperlipidemia     PAST SURGICAL HISTORY: Past Surgical History:  Procedure  Laterality Date   APPENDECTOMY     MOLE REMOVAL     PACEMAKER LEADLESS INSERTION N/A 12/09/2020   Procedure: PACEMAKER LEADLESS INSERTION;  Surgeon: Isaias Cowman, MD;  Location: [facility];  Service: Cardiovascular;  Laterality: N/A;   PERIPHERAL VASCULAR THROMBECTOMY Left 05/03/2022   Procedure: PERIPHERAL VASCULAR THROMBECTOMY;  Surgeon: Algernon Huxley, MD;  Location: [facility];  Service: Cardiovascular;  Laterality: Left;   PULMONARY THROMBECTOMY Bilateral 01/12/2021   Procedure: PULMONARY THROMBECTOMY;  Surgeon: Algernon Huxley, MD;  Location: [place] [facility];  Service: Cardiovascular;  Laterality: Bilateral;   TONSILLECTOMY AND ADENOIDECTOMY     TOTAL HIP ARTHROPLASTY Bilateral     FAMILY HISTORY: Family History  Problem Relation Age of Onset   Breast cancer Mother    Cancer Mother        eye cancer   Heart disease Father    Breast cancer Sister    Stroke Brother    Heart attack Brother    Breast cancer Sister    Aneurysm Sister     ADVANCED DIRECTIVES (Y/N):  N  HEALTH MAINTENANCE: Social History   Tobacco Use   Smoking status: Former    Packs/day: 1.50    Years: 20.00    Total pack years: 30.00    Types: Cigarettes    Quit date: 05/16/1960    Years since quitting: 62.0   Smokeless tobacco: Never   Tobacco comments:    quit in 1968  Substance Use Topics   Alcohol use: No    Alcohol/week: 0.0 standard drinks of alcohol   Drug use: No     Colonoscopy:  PAP:  Bone density:  Lipid panel:  No Known Allergies  Current Outpatient Medications  Medication Sig Dispense Refill   Biotin (BIOTIN 5000) 5 MG CAPS 5,000 mg.     ELIQUIS 5 MG TABS tablet TAKE 2 TABLETS (10 MG TOTAL) BY MOUTH 2 (TWO) TIMES DAILY. AND FROM 01/20/2021 TAKE 5 MG TWICE A DAY 60 tablet 3   Multiple Vitamin (MULTIVITAMIN PO) Take by mouth daily.     simvastatin (ZOCOR) 40 MG tablet TAKE 1 TABLET BY MOUTH EVERY DAY 90 tablet 2   No current facility-administered  medications for this visit.    OBJECTIVE: Vitals:   05/14/22 1040  BP: 114/77  Pulse: 67  Resp: 16  Temp: 97.8 F (36.6 C)  SpO2: 100%     Body mass index is 25.49 kg/m.    ECOG FS:0 - Asymptomatic  General: Well-developed, well-nourished, no acute distress. Eyes: Pink conjunctiva, anicteric sclera. HEENT: Normocephalic, moist mucous membranes. Lungs: No audible wheezing or coughing. Heart: Regular rate and rhythm. Abdomen: Soft, nontender, no obvious distention. Musculoskeletal: No edema, cyanosis, or clubbing. Neuro: Alert, answering all questions appropriately. Cranial nerves grossly intact. Skin: No rashes or petechiae noted. Psych: Normal affect.  LAB RESULTS:  Lab Results  Component Value Date   NA 140 04/30/2022   K 4.0 04/30/2022   CL 106 04/30/2022   CO2 26 04/30/2022   GLUCOSE 106 (H) 04/30/2022   BUN 20 05/03/2022   CREATININE 1.20 05/03/2022   CALCIUM 9.6 04/30/2022   PROT 7.2 03/30/2022   ALBUMIN 4.3 03/30/2022   AST 18 03/30/2022   ALT 14 03/30/2022   ALKPHOS 72 03/30/2022   BILITOT 0.5 03/30/2022   GFRNONAA 56 (L) 05/03/2022   GFRAA 47 (L) 09/13/2019    Lab Results  Component Value Date   WBC 8.7 04/30/2022   NEUTROABS 5.7 03/30/2022   HGB 13.8 04/30/2022   HCT 43.1 04/30/2022   MCV 100.5 (H) 04/30/2022   PLT 207 04/30/2022     STUDIES: PERIPHERAL VASCULAR CATHETERIZATION  Result Date: 05/03/2022 See surgical note for result.  US Venous Img Lower Unilateral Left  Result Date: 04/30/2022 CLINICAL DATA:  Left leg pain and swelling EXAM: LEFT LOWER EXTREMITY VENOUS DOPPLER ULTRASOUND TECHNIQUE: Gray-scale sonography with graded compression, as well as color Doppler and duplex ultrasound were performed to evaluate the lower extremity deep venous systems from the level of the common femoral vein and including the common femoral, femoral, profunda femoral, popliteal and calf veins including the posterior tibial, peroneal and gastrocnemius  veins when visible. The superficial great saphenous vein was also interrogated. Spectral Doppler was utilized to evaluate flow at rest and with distal augmentation maneuvers in the common femoral, femoral and popliteal veins. COMPARISON:  None Available. FINDINGS: Contralateral Common Femoral Vein: Respiratory phasicity is normal and symmetric with the symptomatic side. No evidence of thrombus. Normal compressibility. Common Femoral Vein: No evidence of thrombus. Normal compressibility, respiratory phasicity and response to augmentation. Saphenofemoral Junction: No evidence of thrombus. Normal compressibility and flow on color Doppler imaging. Profunda Femoral Vein: No evidence of thrombus. Normal compressibility and flow on color Doppler imaging. Femoral Vein: Thrombus is noted in the peripheral femoral vein with decreased compressibility. Popliteal Vein: Thrombus is noted within the popliteal vein with decreased compressibility. Calf Veins: No evidence of thrombus. Normal compressibility and flow on color Doppler imaging. Superficial Great Saphenous Vein: No evidence of thrombus. Normal compressibility. Venous Reflux:  None. Other Findings: Complex cystic area is noted in the left popliteal fossa measuring 7.8  x 2.7 x 4.2 cm. IMPRESSION: Thrombus is noted within the femoral and popliteal vein with decreased compressibility. Left popliteal fossa cyst. Electronically Signed   By: Inez Catalina M.D.   On: 04/30/2022 03:41    ASSESSMENT: Recurrent PE/DVT  PLAN:    Recurrent PE/DVT: Patient's initial DVT was in 2018 after an extended flight home from [place].  His second lifetime DVT in 2022 did not appear to have any transient risk factors and patient was placed on lifelong Eliquis at that time.  Previously, factor V Leiden and prothrombin gene mutation were negative.  With the patient's most recent blood clot, he does not appear to have any transient risk factors and states he has 100% compliant with his Eliquis.   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  No further intervention is needed.  We discussed the possibility of a full hypercoagulable workup, but patient declined at this time.  Return to clinic in 3 months for routine evaluation.  I spent a total of 30 minutes reviewing chart data, face-to-face evaluation with the patient, counseling and coordination of care as detailed above.  Patient expressed understanding and was in agreement with this plan. He also understands that He can call clinic at any time with any questions, concerns, or complaints.    Lloyd Huger, MD   05/14/2022 12:31 PM

## 2022-05-20 ENCOUNTER — Telehealth: Payer: Self-pay | Admitting: Family Medicine

## 2022-05-20 NOTE — Telephone Encounter (Signed)
Patient mailed in request for medical history form.  Form has been completed.  Please contact patient to either pick up form or arrange for this to be returned to patient via mail  Matthew Foster, MD [facility]

## 2022-05-21 NOTE — Telephone Encounter (Signed)
[id number]-[id number] for [facility] Nurse to advised

## 2022-05-24 NOTE — Telephone Encounter (Signed)
Patient came to pick up forms.Given by Arbie Cookey up front

## 2022-05-25 ENCOUNTER — Ambulatory Visit (INDEPENDENT_AMBULATORY_CARE_PROVIDER_SITE_OTHER): Payer: PPO | Admitting: Nurse Practitioner

## 2022-05-25 ENCOUNTER — Encounter (INDEPENDENT_AMBULATORY_CARE_PROVIDER_SITE_OTHER): Payer: Self-pay | Admitting: Nurse Practitioner

## 2022-05-25 VITALS — BP 106/64 | HR 60 | Ht 66.0 in | Wt 148.0 lb

## 2022-05-25 DIAGNOSIS — I10 Essential (primary) hypertension: Secondary | ICD-10-CM | POA: Diagnosis not present

## 2022-05-25 DIAGNOSIS — I824Y2 Acute embolism and thrombosis of unspecified deep veins of left proximal lower extremity: Secondary | ICD-10-CM | POA: Diagnosis not present

## 2022-06-03 DIAGNOSIS — E785 Hyperlipidemia, unspecified: Secondary | ICD-10-CM | POA: Diagnosis not present

## 2022-06-03 DIAGNOSIS — Z86718 Personal history of other venous thrombosis and embolism: Secondary | ICD-10-CM | POA: Diagnosis not present

## 2022-06-03 DIAGNOSIS — I495 Sick sinus syndrome: Secondary | ICD-10-CM | POA: Diagnosis not present

## 2022-06-03 DIAGNOSIS — I10 Essential (primary) hypertension: Secondary | ICD-10-CM | POA: Diagnosis not present

## 2022-06-06 ENCOUNTER — Encounter (INDEPENDENT_AMBULATORY_CARE_PROVIDER_SITE_OTHER): Payer: Self-pay | Admitting: Nurse Practitioner

## 2022-06-06 NOTE — Progress Notes (Signed)
Subjective:    Patient ID: Matthew Clayton, male    DOB: 12/31/1926, 89 y.o.   MRN: [id number] Chief Complaint  Patient presents with   Follow-up    Floolw up for recent Clot    Matthew Clayton is a 89 year old male who underwent a left lower extremity thrombectomy for DVT on 05/03/2022.  The patient was on Eliquis prior to his DVT and was diligent with taking his medication.  It was initially felt that he had an Eliquis failure and was placed on Lovenox for short timeframe.  He notes that postintervention and the swelling is much improved.  He continues to wear medical grade compression stockings as well.  He was switched by the hematologist to Xarelto which she is compliant with as well.    Review of Systems  Cardiovascular:  Negative for leg swelling.  All other systems reviewed and are negative.      Objective:   Physical Exam Vitals reviewed.  HENT:     Head: Normocephalic.  Cardiovascular:     Rate and Rhythm: Normal rate.  Pulmonary:     Effort: Pulmonary effort is normal.  Musculoskeletal:     Left lower leg: Edema present.  Skin:    General: Skin is warm and dry.  Neurological:     Mental Status: He is alert and oriented to person, place, and time.  Psychiatric:        Mood and Affect: Mood normal.        Behavior: Behavior normal.        Thought Content: Thought content normal.        Judgment: Judgment normal.     BP 106/64 (BP Location: Right Arm, Patient Position: Sitting, Cuff Size: Normal)   Pulse 60   Ht '5\' 6"'$  (1.676 m)   Wt 148 lb (67.1 kg)   BMI 23.89 kg/m   Past Medical History:  Diagnosis Date   Cyst of knee joint, left    DVT (deep venous thrombosis) ([facility])    left leg   Hyperlipidemia     Social History   Socioeconomic History   Marital status: Married    Spouse name: Gwinda Passe   Number of children: 4   Years of education: bachelors   Highest education level: Bachelor's degree (e.g., BA, AB, BS)  Occupational History   Occupation: Retired   Tobacco Use   Smoking status: Former    Packs/day: 1.50    Years: 20.00    Total pack years: 30.00    Types: Cigarettes    Quit date: 05/16/1960    Years since quitting: 62.0    Passive exposure: Never   Smokeless tobacco: Never   Tobacco comments:    quit in 1968  Substance and Sexual Activity   Alcohol use: No    Alcohol/week: 0.0 standard drinks of alcohol   Drug use: No   Sexual activity: Not on file  Other Topics Concern   Not on file  Social History Narrative   Not on file   Social Determinants of Health   Financial Resource Strain: Low Risk  (06/30/2021)   Overall Financial Resource Strain (CARDIA)    Difficulty of Paying Living Expenses: Not hard at all  Food Insecurity: No Food Insecurity (06/30/2021)   Hunger Vital Sign    Worried About Running Out of Food in the Last Year: Never true    Ran Out of Food in the Last Year: Never true  Transportation Needs: No Transportation Needs (06/30/2021)  PRAPARE - Hydrologist (Medical): No    Lack of Transportation (Non-Medical): No  Physical Activity: Inactive (06/30/2021)   Exercise Vital Sign    Days of Exercise per Week: 0 days    Minutes of Exercise per Session: 0 min  Stress: No Stress Concern Present (06/30/2021)   [place]    Feeling of Stress : Not at all  Social Connections: Moderately Integrated (06/30/2021)   Social Connection and Isolation Panel [NHANES]    Frequency of Communication with Friends and Family: Twice a week    Frequency of Social Gatherings with Friends and Family: More than three times a week    Attends Religious Services: More than 4 times per year    Active Member of [employer] or Organizations: No    Attends Archivist Meetings: Never    Marital Status: Married  Human resources officer Violence: Not At Risk (06/30/2021)   Humiliation, Afraid, Rape, and Kick questionnaire    Fear of Current or  Ex-Partner: No    Emotionally Abused: No    Physically Abused: No    Sexually Abused: No    Past Surgical History:  Procedure Laterality Date   APPENDECTOMY     MOLE REMOVAL     PACEMAKER LEADLESS INSERTION N/A 12/09/2020   Procedure: PACEMAKER LEADLESS INSERTION;  Surgeon: Isaias Cowman, MD;  Location: [place] [facility];  Service: Cardiovascular;  Laterality: N/A;   PERIPHERAL VASCULAR THROMBECTOMY Left 05/03/2022   Procedure: PERIPHERAL VASCULAR THROMBECTOMY;  Surgeon: Algernon Huxley, MD;  Location: [place] [facility];  Service: Cardiovascular;  Laterality: Left;   PULMONARY THROMBECTOMY Bilateral 01/12/2021   Procedure: PULMONARY THROMBECTOMY;  Surgeon: Algernon Huxley, MD;  Location: [place] [facility];  Service: Cardiovascular;  Laterality: Bilateral;   TONSILLECTOMY AND ADENOIDECTOMY     TOTAL HIP ARTHROPLASTY Bilateral     Family History  Problem Relation Age of Onset   Breast cancer Mother    Cancer Mother        eye cancer   Heart disease Father    Breast cancer Sister    Stroke Brother    Heart attack Brother    Breast cancer Sister    Aneurysm Sister     No Known Allergies     Latest Ref Rng & Units 04/30/2022    1:39 AM 03/30/2022   11:21 AM 10/11/2021    4:01 PM  CBC  WBC 4.0 - 10.5 K/uL 8.7  8.5  11.0   Hemoglobin 13.0 - 17.0 g/dL 13.8  13.7  13.2   Hematocrit 39.0 - 52.0 % 43.1  40.2  40.1   Platelets 150 - 400 K/uL 207  CANCELED  199       CMP     Component Value Date/Time   NA 140 04/30/2022 0139   NA 140 03/30/2022 1121   K 4.0 04/30/2022 0139   CL 106 04/30/2022 0139   CO2 26 04/30/2022 0139   GLUCOSE 106 (H) 04/30/2022 0139   BUN 20 05/03/2022 1031   BUN 17 03/30/2022 1121   CREATININE 1.20 05/03/2022 1031   CALCIUM 9.6 04/30/2022 0139   PROT 7.2 03/30/2022 1121   ALBUMIN 4.3 03/30/2022 1121   AST 18 03/30/2022 1121   ALT 14 03/30/2022 1121   ALKPHOS 72 03/30/2022 1121   BILITOT 0.5 03/30/2022 1121   GFRNONAA 56 (L)  05/03/2022 1031   GFRAA 47 (L) 09/13/2019 1141  No results found.     Assessment & Plan:   1. Deep vein thrombosis (DVT) of proximal vein of left lower extremity, unspecified chronicity ([place]) Patient underwent successful left lower extremity thrombectomy.  He was switched to Xarelto from Eliquis.  We suspect that this was less likely of failure of Eliquis but more so due to stenosis of the iliac vessels.  Will have the patient follow-up with noninvasive studies in March.  2. Essential hypertension Continue antihypertensive medications as already ordered, these medications have been reviewed and there are no changes at this time.   Current Outpatient Medications on File Prior to Visit  Medication Sig Dispense Refill   Biotin (BIOTIN 5000) 5 MG CAPS 5,000 mg.     Multiple Vitamin (MULTIVITAMIN PO) Take by mouth daily.     rivaroxaban (XARELTO) 20 MG TABS tablet Take 1 tablet (20 mg total) by mouth daily with supper. On first day of treatment, only take one 5 mg dose of Eliquis.  On second day of treatment discontinue Eliquis altogether.  Continue 20 mg daily thereafter. 90 tablet 3   simvastatin (ZOCOR) 40 MG tablet TAKE 1 TABLET BY MOUTH EVERY DAY 90 tablet 2   ELIQUIS 5 MG TABS tablet TAKE 2 TABLETS (10 MG TOTAL) BY MOUTH 2 (TWO) TIMES DAILY. AND FROM 01/20/2021 TAKE 5 MG TWICE A DAY 60 tablet 3   No current facility-administered medications on file prior to visit.    There are no Patient Instructions on file for this visit. No follow-ups on file.   Kris Hartmann, NP

## 2022-07-17 DIAGNOSIS — Z86718 Personal history of other venous thrombosis and embolism: Secondary | ICD-10-CM | POA: Diagnosis not present

## 2022-07-17 DIAGNOSIS — Z7901 Long term (current) use of anticoagulants: Secondary | ICD-10-CM | POA: Diagnosis not present

## 2022-07-17 DIAGNOSIS — M1712 Unilateral primary osteoarthritis, left knee: Secondary | ICD-10-CM | POA: Diagnosis not present

## 2022-07-17 DIAGNOSIS — Z87891 Personal history of nicotine dependence: Secondary | ICD-10-CM | POA: Diagnosis not present

## 2022-07-17 DIAGNOSIS — M25462 Effusion, left knee: Secondary | ICD-10-CM | POA: Diagnosis not present

## 2022-07-17 DIAGNOSIS — M7122 Synovial cyst of popliteal space [Baker], left knee: Secondary | ICD-10-CM | POA: Diagnosis not present

## 2022-07-17 DIAGNOSIS — Z86711 Personal history of pulmonary embolism: Secondary | ICD-10-CM | POA: Diagnosis not present

## 2022-08-10 ENCOUNTER — Ambulatory Visit (INDEPENDENT_AMBULATORY_CARE_PROVIDER_SITE_OTHER): Payer: PPO | Admitting: Vascular Surgery

## 2022-08-10 ENCOUNTER — Encounter (INDEPENDENT_AMBULATORY_CARE_PROVIDER_SITE_OTHER): Payer: Self-pay | Admitting: Vascular Surgery

## 2022-08-10 VITALS — BP 104/65 | HR 60 | Resp 16 | Wt 151.4 lb

## 2022-08-10 DIAGNOSIS — I10 Essential (primary) hypertension: Secondary | ICD-10-CM | POA: Diagnosis not present

## 2022-08-10 DIAGNOSIS — I824Y2 Acute embolism and thrombosis of unspecified deep veins of left proximal lower extremity: Secondary | ICD-10-CM

## 2022-08-10 DIAGNOSIS — I2699 Other pulmonary embolism without acute cor pulmonale: Secondary | ICD-10-CM | POA: Diagnosis not present

## 2022-08-10 NOTE — Assessment & Plan Note (Signed)
blood pressure control important in reducing the progression of atherosclerotic disease. On appropriate oral medications.  

## 2022-08-10 NOTE — Assessment & Plan Note (Signed)
Some degree of postphlebitic swelling.  This was a second thrombotic event.  Would continue anticoagulation indefinitely.  Contact us if his symptoms worsen.

## 2022-08-10 NOTE — Progress Notes (Signed)
MRN : [id number]  Matthew Clayton is a 89 y.o. (12-29-1926) male who presents with chief complaint of  Chief Complaint  Patient presents with   Follow-up    1 yr follow up  .  History of Present Illness: Patient returns today in follow up of his previous DVT and PE.  He still has some chronic left leg swelling although is reasonably well-controlled.  Compression and elevation seems to do a good job with this.  It does not really hurt him.  No open wounds or infection.  No current chest pain or shortness of breath.  Seems to be tolerating Xarelto quite well without any signs of bleeding or recurrent thrombosis.  Current Outpatient Medications  Medication Sig Dispense Refill   Biotin (BIOTIN 5000) 5 MG CAPS 5,000 mg.     Multiple Vitamin (MULTIVITAMIN PO) Take by mouth daily.     rivaroxaban (XARELTO) 20 MG TABS tablet Take 1 tablet (20 mg total) by mouth daily with supper. On first day of treatment, only take one 5 mg dose of Eliquis.  On second day of treatment discontinue Eliquis altogether.  Continue 20 mg daily thereafter. 90 tablet 3   simvastatin (ZOCOR) 40 MG tablet TAKE 1 TABLET BY MOUTH EVERY DAY 90 tablet 2   ELIQUIS 5 MG TABS tablet TAKE 2 TABLETS (10 MG TOTAL) BY MOUTH 2 (TWO) TIMES DAILY. AND FROM 01/20/2021 TAKE 5 MG TWICE A DAY 60 tablet 3   No current facility-administered medications for this visit.    Past Medical History:  Diagnosis Date   Cyst of knee joint, left    DVT (deep venous thrombosis) ([place])    left leg   Hyperlipidemia     Past Surgical History:  Procedure Laterality Date   APPENDECTOMY     MOLE REMOVAL     PACEMAKER LEADLESS INSERTION N/A 12/09/2020   Procedure: PACEMAKER LEADLESS INSERTION;  Surgeon: Isaias Cowman, MD;  Location: [place] [facility];  Service: Cardiovascular;  Laterality: N/A;   PERIPHERAL VASCULAR THROMBECTOMY Left 05/03/2022   Procedure: PERIPHERAL VASCULAR THROMBECTOMY;  Surgeon: Algernon Huxley, MD;  Location: [place]  [facility];  Service: Cardiovascular;  Laterality: Left;   PULMONARY THROMBECTOMY Bilateral 01/12/2021   Procedure: PULMONARY THROMBECTOMY;  Surgeon: Algernon Huxley, MD;  Location: [place] [facility];  Service: Cardiovascular;  Laterality: Bilateral;   TONSILLECTOMY AND ADENOIDECTOMY     TOTAL HIP ARTHROPLASTY Bilateral      Social History   Tobacco Use   Smoking status: Former    Packs/day: 1.50    Years: 20.00    Additional pack years: 0.00    Total pack years: 30.00    Types: Cigarettes    Quit date: 05/16/1960    Years since quitting: 62.2    Passive exposure: Never   Smokeless tobacco: Never   Tobacco comments:    quit in 1968  Substance Use Topics   Alcohol use: No    Alcohol/week: 0.0 standard drinks of alcohol   Drug use: No       Family History  Problem Relation Age of Onset   Breast cancer Mother    Cancer Mother        eye cancer   Heart disease Father    Breast cancer Sister    Stroke Brother    Heart attack Brother    Breast cancer Sister    Aneurysm Sister      No Known Allergies   REVIEW OF SYSTEMS (Negative  unless checked)  Constitutional: [] Weight loss  [] Fever  [] Chills Cardiac: [] Chest pain   [] Chest pressure   [] Palpitations   [] Shortness of breath when laying flat   [] Shortness of breath at rest   [] Shortness of breath with exertion. Vascular:  [] Pain in legs with walking   [] Pain in legs at rest   [] Pain in legs when laying flat   [] Claudication   [] Pain in feet when walking  [] Pain in feet at rest  [] Pain in feet when laying flat   [x] History of DVT   [x] Phlebitis   [x] Swelling in legs   [] Varicose veins   [] Non-healing ulcers Pulmonary:   [] Uses home oxygen   [] Productive cough   [] Hemoptysis   [] Wheeze  [] COPD   [] Asthma Neurologic:  [] Dizziness  [] Blackouts   [] Seizures   [] History of stroke   [] History of TIA  [] Aphasia   [] Temporary blindness   [] Dysphagia   [] Weakness or numbness in arms   [] Weakness or numbness in legs Musculoskeletal:   [x] Arthritis   [] Joint swelling   [] Joint pain   [] Low back pain Hematologic:  [] Easy bruising  [] Easy bleeding   [] Hypercoagulable state   [] Anemic   Gastrointestinal:  [] Blood in stool   [] Vomiting blood  [] Gastroesophageal reflux/heartburn   [] Abdominal pain Genitourinary:  [] Chronic kidney disease   [] Difficult urination  [] Frequent urination  [] Burning with urination   [] Hematuria Skin:  [] Rashes   [] Ulcers   [] Wounds Psychological:  [] History of anxiety   []  History of major depression.  Physical Examination  BP 104/65 (BP Location: Left Arm)   Pulse 60   Resp 16   Wt 151 lb 6.4 oz (68.7 kg)   BMI 24.44 kg/m  Gen:  WD/WN, NAD. Appears far younger than stated age. Head: [place]/AT, No temporalis wasting. Ear/Nose/Throat: Hearing grossly intact, nares w/o erythema or drainage Eyes: Conjunctiva clear. Sclera non-icteric Neck: Supple.  Trachea midline Pulmonary:  Good air movement, no use of accessory muscles.  Cardiac: bradycardic Vascular:  Vessel Right Left  Radial Palpable Palpable               Musculoskeletal: M/S 5/5 throughout.  No deformity or atrophy. No RLE edema, 1+ LLE edema. Neurologic: Sensation grossly intact in extremities.  Symmetrical.  Speech is fluent.  Psychiatric: Judgment intact, Mood & affect appropriate for pt's clinical situation. Dermatologic: No rashes or ulcers noted.  No cellulitis or open wounds.      Labs No results found for this or any previous visit (from the past 2160 hour(s)).  Radiology No results found.  Assessment/Plan  No problem-specific Assessment & Plan notes found for this encounter.    Leotis Pain, MD  08/10/2022 3:31 PM    This note was created with Dragon medical transcription system.  Any errors from dictation are purely unintentional

## 2022-08-10 NOTE — Assessment & Plan Note (Signed)
Previous PE.  Was on Eliquis and had a DVT.  Will remain on Xarelto indefinitely.

## 2022-08-13 ENCOUNTER — Inpatient Hospital Stay: Payer: PPO | Attending: Oncology | Admitting: Oncology

## 2022-08-13 ENCOUNTER — Encounter: Payer: Self-pay | Admitting: Oncology

## 2022-08-13 VITALS — BP 112/75 | HR 57 | Temp 96.8°F | Resp 16 | Ht 66.0 in | Wt 153.0 lb

## 2022-08-13 DIAGNOSIS — I2699 Other pulmonary embolism without acute cor pulmonale: Secondary | ICD-10-CM

## 2022-08-13 DIAGNOSIS — Z7901 Long term (current) use of anticoagulants: Secondary | ICD-10-CM | POA: Insufficient documentation

## 2022-08-13 DIAGNOSIS — Z86711 Personal history of pulmonary embolism: Secondary | ICD-10-CM | POA: Diagnosis not present

## 2022-08-13 DIAGNOSIS — Z87891 Personal history of nicotine dependence: Secondary | ICD-10-CM | POA: Diagnosis not present

## 2022-08-13 DIAGNOSIS — Z79899 Other long term (current) drug therapy: Secondary | ICD-10-CM | POA: Diagnosis not present

## 2022-08-13 DIAGNOSIS — Z86718 Personal history of other venous thrombosis and embolism: Secondary | ICD-10-CM | POA: Diagnosis not present

## 2022-08-13 NOTE — Progress Notes (Signed)
[facility]  [phone] Fax:(336) [phone]  ID: Matthew Clayton OB: 19-Dec-1926  MR#: [id number]  [id number]  Patient Care Team: Matthew Foster, MD as PCP - General (Family Medicine) Schnier, Dolores Lory, MD (Vascular Surgery) Lorelee Cover., MD (Ophthalmology)  CHIEF COMPLAINT: Recurrent PE/DVT  INTERVAL HISTORY: Patient returns to clinic today for routine 31-month evaluation.  He currently feels well and is asymptomatic.  He is tolerating Xarelto without significant side effects.  He has no neurologic complaints.  He has a good appetite and denies weight loss.  He denies any chest pain, shortness of breath, cough, or hemoptysis.  He has no nausea, vomiting, constipation, or diarrhea.  He has no urinary complaints.  Patient offers no specific complaints today.  REVIEW OF SYSTEMS:   Review of Systems  Constitutional: Negative.  Negative for fever, malaise/fatigue and weight loss.  Respiratory: Negative.  Negative for cough, hemoptysis and shortness of breath.   Cardiovascular: Negative.  Negative for chest pain and leg swelling.  Gastrointestinal: Negative.  Negative for abdominal pain.  Genitourinary: Negative.  Negative for dysuria.  Musculoskeletal: Negative.  Negative for back pain.  Skin: Negative.  Negative for rash.  Neurological: Negative.  Negative for dizziness, focal weakness, weakness and headaches.  Endo/Heme/Allergies:  Does not bruise/bleed easily.  Psychiatric/Behavioral: Negative.  The patient is not nervous/anxious.     As per HPI. Otherwise, a complete review of systems is negative.  PAST MEDICAL HISTORY: Past Medical History:  Diagnosis Date   Cyst of knee joint, left    DVT (deep venous thrombosis) ([place])    left leg   Hyperlipidemia     PAST SURGICAL HISTORY: Past Surgical History:  Procedure Laterality Date   APPENDECTOMY     MOLE REMOVAL     PACEMAKER LEADLESS INSERTION N/A 12/09/2020   Procedure:  PACEMAKER LEADLESS INSERTION;  Surgeon: Isaias Cowman, MD;  Location: [facility];  Service: Cardiovascular;  Laterality: N/A;   PERIPHERAL VASCULAR THROMBECTOMY Left 05/03/2022   Procedure: PERIPHERAL VASCULAR THROMBECTOMY;  Surgeon: Algernon Huxley, MD;  Location: [facility];  Service: Cardiovascular;  Laterality: Left;   PULMONARY THROMBECTOMY Bilateral 01/12/2021   Procedure: PULMONARY THROMBECTOMY;  Surgeon: Algernon Huxley, MD;  Location: [facility];  Service: Cardiovascular;  Laterality: Bilateral;   TONSILLECTOMY AND ADENOIDECTOMY     TOTAL HIP ARTHROPLASTY Bilateral     FAMILY HISTORY: Family History  Problem Relation Age of Onset   Breast cancer Mother    Cancer Mother        eye cancer   Heart disease Father    Breast cancer Sister    Stroke Brother    Heart attack Brother    Breast cancer Sister    Aneurysm Sister     ADVANCED DIRECTIVES (Y/N):  N  HEALTH MAINTENANCE: Social History   Tobacco Use   Smoking status: Former    Packs/day: 1.50    Years: 20.00    Additional pack years: 0.00    Total pack years: 30.00    Types: Cigarettes    Quit date: 05/16/1960    Years since quitting: 62.2    Passive exposure: Never   Smokeless tobacco: Never   Tobacco comments:    quit in 1968  Substance Use Topics   Alcohol use: No    Alcohol/week: 0.0 standard drinks of alcohol   Drug use: No     Colonoscopy:  PAP:  Bone density:  Lipid panel:  No Known Allergies  Current Outpatient Medications  Medication Sig Dispense Refill   Biotin (BIOTIN 5000) 5 MG CAPS 5,000 mg.     COLLAGEN PO Take by mouth.     Multiple Vitamin (MULTIVITAMIN PO) Take by mouth daily.     rivaroxaban (XARELTO) 20 MG TABS tablet Take 1 tablet (20 mg total) by mouth daily with supper. On first day of treatment, only take one 5 mg dose of Eliquis.  On second day of treatment discontinue Eliquis altogether.  Continue 20 mg daily thereafter. 90 tablet 3    simvastatin (ZOCOR) 40 MG tablet TAKE 1 TABLET BY MOUTH EVERY DAY 90 tablet 2   No current facility-administered medications for this visit.    OBJECTIVE: Vitals:   08/13/22 1035  BP: 112/75  Pulse: (!) 57  Resp: 16  Temp: (!) 96.8 F (36 C)  SpO2: 100%     Body mass index is 24.69 kg/m.    ECOG FS:0 - Asymptomatic  General: Well-developed, well-nourished, no acute distress. Eyes: Pink conjunctiva, anicteric sclera. HEENT: Normocephalic, moist mucous membranes. Lungs: No audible wheezing or coughing. Heart: Regular rate and rhythm. Abdomen: Soft, nontender, no obvious distention. Musculoskeletal: No edema, cyanosis, or clubbing. Neuro: Alert, answering all questions appropriately. Cranial nerves grossly intact. Skin: No rashes or petechiae noted. Psych: Normal affect.  LAB RESULTS:  Lab Results  Component Value Date   NA 140 04/30/2022   K 4.0 04/30/2022   CL 106 04/30/2022   CO2 26 04/30/2022   GLUCOSE 106 (H) 04/30/2022   BUN 20 05/03/2022   CREATININE 1.20 05/03/2022   CALCIUM 9.6 04/30/2022   PROT 7.2 03/30/2022   ALBUMIN 4.3 03/30/2022   AST 18 03/30/2022   ALT 14 03/30/2022   ALKPHOS 72 03/30/2022   BILITOT 0.5 03/30/2022   GFRNONAA 56 (L) 05/03/2022   GFRAA 47 (L) 09/13/2019    Lab Results  Component Value Date   WBC 8.7 04/30/2022   NEUTROABS 5.7 03/30/2022   HGB 13.8 04/30/2022   HCT 43.1 04/30/2022   MCV 100.5 (H) 04/30/2022   PLT 207 04/30/2022     STUDIES: No results found.  ASSESSMENT: Recurrent PE/DVT  PLAN:    Recurrent PE/DVT: Patient's initial DVT was in 2018 after an extended flight home from [place].  His second lifetime DVT in 2022 did not appear to have any transient risk factors and patient was placed on lifelong Eliquis at that time.  Previously, factor V Leiden and prothrombin gene mutation were negative.  With the patient's most recent blood clot, he does not appear to have any transient risk factors and states he has 100%  compliant with his Eliquis.  He will be considered an Eliquis failure and was switched to 20 mg Xarelto daily.  No further intervention is needed.  Previously, patient declined full hypercoagulable workup.  No further follow-up is scheduled.  Patient can receive his Xarelto refills from primary care.  Please refer patient back if there are any questions or concerns.  I spent a total of 20 minutes reviewing chart data, face-to-face evaluation with the patient, counseling and coordination of care as detailed above.   Patient expressed understanding and was in agreement with this plan. He also understands that He can call clinic at any time with any questions, concerns, or complaints.    Lloyd Huger, MD   08/13/2022 12:01 PM

## 2022-08-18 DIAGNOSIS — I495 Sick sinus syndrome: Secondary | ICD-10-CM | POA: Diagnosis not present

## 2022-09-13 ENCOUNTER — Ambulatory Visit (INDEPENDENT_AMBULATORY_CARE_PROVIDER_SITE_OTHER): Payer: PPO | Admitting: Family Medicine

## 2022-09-13 VITALS — BP 101/65 | HR 64 | Resp 16 | Ht 66.0 in | Wt 147.0 lb

## 2022-09-13 DIAGNOSIS — R059 Cough, unspecified: Secondary | ICD-10-CM

## 2022-09-13 MED ORDER — CETIRIZINE HCL 10 MG PO TABS: 10.0000 mg | ORAL_TABLET | Freq: Every day | ORAL | 6 refills | Status: DC

## 2022-09-13 MED ORDER — AZELASTINE HCL 0.1 % NA SOLN: 2.0000 | Freq: Every day | NASAL | 12 refills | Status: DC

## 2022-09-13 NOTE — Progress Notes (Signed)
I,Joseline E Rosas,acting as a scribe for [employer], MD.,have documented all relevant documentation on the behalf of Ronnald Ramp, MD,as directed by  Ronnald Ramp, MD while in the presence of Ronnald Ramp, MD.   Established patient visit   Patient: Matthew Clayton   DOB: [birth year]   89 y.o. Male  MRN: [id number] Visit Date: 09/13/2022  Today's healthcare provider: Ronnald Ramp, MD   Chief Complaint  Patient presents with   Cough   Subjective    Cough This is a new problem. Episode onset: 3 days. The problem has been unchanged. The problem occurs constantly. The cough is Productive of sputum (gray). Associated symptoms include nasal congestion ("slight") and postnasal drip. Pertinent negatives include no fever, headaches, rhinorrhea, sore throat, shortness of breath or wheezing. Associated symptoms comments: fatigue. The symptoms are aggravated by pollens. He has tried OTC cough suppressant (Robitussin) for the symptoms. The treatment provided no relief.      Medications: Outpatient Medications Prior to Visit  Medication Sig   Biotin (BIOTIN 5000) 5 MG CAPS 5,000 mg.   COLLAGEN PO Take by mouth.   Multiple Vitamin (MULTIVITAMIN PO) Take by mouth daily.   rivaroxaban (XARELTO) 20 MG TABS tablet Take 1 tablet (20 mg total) by mouth daily with supper. On first day of treatment, only take one 5 mg dose of Eliquis.  On second day of treatment discontinue Eliquis altogether.  Continue 20 mg daily thereafter.   simvastatin (ZOCOR) 40 MG tablet TAKE 1 TABLET BY MOUTH EVERY DAY   No facility-administered medications prior to visit.    Review of Systems  Constitutional:  Negative for fever.  HENT:  Positive for postnasal drip. Negative for rhinorrhea and sore throat.   Respiratory:  Positive for cough. Negative for shortness of breath and wheezing.   Neurological:  Negative for headaches.       Objective    BP 101/65  (BP Location: Left Arm, Patient Position: Sitting, Cuff Size: Normal)   Pulse 64   Resp 16   Ht 5\' 6"  (1.676 m)   Wt 147 lb (66.7 kg)   SpO2 96%   BMI 23.73 kg/m    Physical Exam Vitals reviewed.  Constitutional:      General: He is not in acute distress.    Appearance: Normal appearance. He is not ill-appearing, toxic-appearing or diaphoretic.  HENT:     Right Ear: Ear canal normal. No decreased hearing noted. A middle ear effusion is present. Tympanic membrane is not perforated, erythematous or bulging.     Left Ear: Ear canal normal. No decreased hearing noted. A middle ear effusion is present. Tympanic membrane is not perforated, erythematous or bulging.  Eyes:     Conjunctiva/sclera: Conjunctivae normal.  Cardiovascular:     Rate and Rhythm: Normal rate and regular rhythm.     Pulses: Normal pulses.     Heart sounds: Normal heart sounds. No murmur heard.    No friction rub. No gallop.  Pulmonary:     Effort: Pulmonary effort is normal. No respiratory distress.     Breath sounds: Normal breath sounds. No stridor. No wheezing, rhonchi or rales.  Abdominal:     General: Bowel sounds are normal. There is no distension.     Palpations: Abdomen is soft.     Tenderness: There is no abdominal tenderness.  Musculoskeletal:     Right lower leg: No edema.     Left lower leg: No edema.  Skin:  Findings: No erythema or rash.  Neurological:     Mental Status: He is alert and oriented to person, place, and time.       No results found for any visits on 09/13/22.  Assessment & Plan     Problem List Items Addressed This Visit       Other   Cough in adult - Primary    Subacute cough normal oxgyen saturation on exam today  Suspect this is related to postnasal drip given exam findings consistent with congestion  Prescribed azelastine nasal spray, 2 sprays per nostril daily  Also recommended cetirizine 10mg  daily to help with congestion  Encouraged patient to drink plenty of  fluids to help maintain hydration  Patient to return to care if symptoms worsen Counseled patient on importance of monitoring for SOB, lightheadedness or dizziness and proceeding to ED if these symptoms occur         No follow-ups on file.        The entirety of the information documented in the History of Present Illness, Review of Systems and Physical Exam were personally obtained by me. Portions of this information were initially documented by Hetty Ely, CMA . I, Ronnald Ramp, MD have reviewed the documentation above for thoroughness and accuracy.      Ronnald Ramp, MD  [facility] [phone] (phone) [phone] (fax)  [facility]

## 2022-09-13 NOTE — Assessment & Plan Note (Signed)
Subacute cough normal oxgyen saturation on exam today  Suspect this is related to postnasal drip given exam findings consistent with congestion  Prescribed azelastine nasal spray, 2 sprays per nostril daily  Also recommended cetirizine 10mg  daily to help with congestion  Encouraged patient to drink plenty of fluids to help maintain hydration  Patient to return to care if symptoms worsen Counseled patient on importance of monitoring for SOB, lightheadedness or dizziness and proceeding to ED if these symptoms occur

## 2022-09-21 DIAGNOSIS — H25813 Combined forms of age-related cataract, bilateral: Secondary | ICD-10-CM | POA: Diagnosis not present

## 2022-09-27 ENCOUNTER — Telehealth: Payer: Self-pay

## 2022-09-27 ENCOUNTER — Ambulatory Visit (INDEPENDENT_AMBULATORY_CARE_PROVIDER_SITE_OTHER): Payer: PPO

## 2022-09-27 VITALS — Ht 66.0 in | Wt 145.0 lb

## 2022-09-27 DIAGNOSIS — Z0000 Encounter for general adult medical examination without abnormal findings: Secondary | ICD-10-CM | POA: Diagnosis not present

## 2022-09-27 NOTE — Patient Instructions (Signed)
Matthew Clayton , Thank you for taking time to come for your Medicare Wellness Visit. I appreciate your ongoing commitment to your health goals. Please review the following plan we discussed and let me know if I can assist you in the future.   These are the goals we discussed:  Goals      DIET - EAT MORE FRUITS AND VEGETABLES     Increase water intake     Recommend increasing water intake to 4 glasses of water a day.         This is a list of the screening recommended for you and due dates:  Health Maintenance  Topic Date Due   Zoster (Shingles) Vaccine (1 of 2) Never done   COVID-19 Vaccine (5 - 2023-24 season) 01/15/2022   DTaP/Tdap/Td vaccine (2 - Tdap) 09/16/2022   Flu Shot  12/16/2022   Medicare Annual Wellness Visit  09/27/2023   Pneumonia Vaccine  Completed   HPV Vaccine  Aged Out    Advanced directives: yes  Conditions/risks identified: low falls risk  Next appointment: Follow up in one year for your annual wellness visit. 09/28/2023 @10 :15am telephone  Preventive Care 65 Years and Older, Male  Preventive care refers to lifestyle choices and visits with your health care provider that can promote health and wellness. What does preventive care include? A yearly physical exam. This is also called an annual well check. Dental exams once or twice a year. Routine eye exams. Ask your health care provider how often you should have your eyes checked. Personal lifestyle choices, including: Daily care of your teeth and gums. Regular physical activity. Eating a healthy diet. Avoiding tobacco and drug use. Limiting alcohol use. Practicing safe sex. Taking low doses of aspirin every day. Taking vitamin and mineral supplements as recommended by your health care provider. What happens during an annual well check? The services and screenings done by your health care provider during your annual well check will depend on your age, overall health, lifestyle risk factors, and family  history of disease. Counseling  Your health care provider may ask you questions about your: Alcohol use. Tobacco use. Drug use. Emotional well-being. Home and relationship well-being. Sexual activity. Eating habits. History of falls. Memory and ability to understand (cognition). Work and work Astronomer. Screening  You may have the following tests or measurements: Height, weight, and BMI. Blood pressure. Lipid and cholesterol levels. These may be checked every 5 years, or more frequently if you are over 44 years old. Skin check. Lung cancer screening. You may have this screening every year starting at age 19 if you have a 30-pack-year history of smoking and currently smoke or have quit within the past 15 years. Fecal occult blood test (FOBT) of the stool. You may have this test every year starting at age 58. Flexible sigmoidoscopy or colonoscopy. You may have a sigmoidoscopy every 5 years or a colonoscopy every 10 years starting at age 100. Prostate cancer screening. Recommendations will vary depending on your family history and other risks. Hepatitis C blood test. Hepatitis B blood test. Sexually transmitted disease (STD) testing. Diabetes screening. This is done by checking your blood sugar (glucose) after you have not eaten for a while (fasting). You may have this done every 1-3 years. Abdominal aortic aneurysm (AAA) screening. You may need this if you are a current or former smoker. Osteoporosis. You may be screened starting at age 45 if you are at high risk. Talk with your health care provider about your  test results, treatment options, and if necessary, the need for more tests. Vaccines  Your health care provider may recommend certain vaccines, such as: Influenza vaccine. This is recommended every year. Tetanus, diphtheria, and acellular pertussis (Tdap, Td) vaccine. You may need a Td booster every 10 years. Zoster vaccine. You may need this after age 34. Pneumococcal  13-valent conjugate (PCV13) vaccine. One dose is recommended after age 37. Pneumococcal polysaccharide (PPSV23) vaccine. One dose is recommended after age 10. Talk to your health care provider about which screenings and vaccines you need and how often you need them. This information is not intended to replace advice given to you by your health care provider. Make sure you discuss any questions you have with your health care provider. Document Released: 05/30/2015 Document Revised: 01/21/2016 Document Reviewed: 03/04/2015 Elsevier Interactive Patient Education  2017 [employer] Prevention in the Home Falls can cause injuries. They can happen to people of all ages. There are many things you can do to make your home safe and to help prevent falls. What can I do on the outside of my home? Regularly fix the edges of walkways and driveways and fix any cracks. Remove anything that might make you trip as you walk through a door, such as a raised step or threshold. Trim any bushes or trees on the path to your home. Use bright outdoor lighting. Clear any walking paths of anything that might make someone trip, such as rocks or tools. Regularly check to see if handrails are loose or broken. Make sure that both sides of any steps have handrails. Any raised decks and porches should have guardrails on the edges. Have any leaves, snow, or ice cleared regularly. Use sand or salt on walking paths during winter. Clean up any spills in your garage right away. This includes oil or grease spills. What can I do in the bathroom? Use night lights. Install grab bars by the toilet and in the tub and shower. Do not use towel bars as grab bars. Use non-skid mats or decals in the tub or shower. If you need to sit down in the shower, use a plastic, non-slip stool. Keep the floor dry. Clean up any water that spills on the floor as soon as it happens. Remove soap buildup in the tub or shower regularly. Attach  bath mats securely with double-sided non-slip rug tape. Do not have throw rugs and other things on the floor that can make you trip. What can I do in the bedroom? Use night lights. Make sure that you have a light by your bed that is easy to reach. Do not use any sheets or blankets that are too big for your bed. They should not hang down onto the floor. Have a firm chair that has side arms. You can use this for support while you get dressed. Do not have throw rugs and other things on the floor that can make you trip. What can I do in the kitchen? Clean up any spills right away. Avoid walking on wet floors. Keep items that you use a lot in easy-to-reach places. If you need to reach something above you, use a strong step stool that has a grab bar. Keep electrical cords out of the way. Do not use floor polish or wax that makes floors slippery. If you must use wax, use non-skid floor wax. Do not have throw rugs and other things on the floor that can make you trip. What can I do with my  stairs? Do not leave any items on the stairs. Make sure that there are handrails on both sides of the stairs and use them. Fix handrails that are broken or loose. Make sure that handrails are as long as the stairways. Check any carpeting to make sure that it is firmly attached to the stairs. Fix any carpet that is loose or worn. Avoid having throw rugs at the top or bottom of the stairs. If you do have throw rugs, attach them to the floor with carpet tape. Make sure that you have a light switch at the top of the stairs and the bottom of the stairs. If you do not have them, ask someone to add them for you. What else can I do to help prevent falls? Wear shoes that: Do not have high heels. Have rubber bottoms. Are comfortable and fit you well. Are closed at the toe. Do not wear sandals. If you use a stepladder: Make sure that it is fully opened. Do not climb a closed stepladder. Make sure that both sides of the  stepladder are locked into place. Ask someone to hold it for you, if possible. Clearly mark and make sure that you can see: Any grab bars or handrails. First and last steps. Where the edge of each step is. Use tools that help you move around (mobility aids) if they are needed. These include: Canes. Walkers. Scooters. Crutches. Turn on the lights when you go into a dark area. Replace any light bulbs as soon as they burn out. Set up your furniture so you have a clear path. Avoid moving your furniture around. If any of your floors are uneven, fix them. If there are any pets around you, be aware of where they are. Review your medicines with your doctor. Some medicines can make you feel dizzy. This can increase your chance of falling. Ask your doctor what other things that you can do to help prevent falls. This information is not intended to replace advice given to you by your health care provider. Make sure you discuss any questions you have with your health care provider. Document Released: 02/27/2009 Document Revised: 10/09/2015 Document Reviewed: 06/07/2014 Elsevier Interactive Patient Education  2017 [employer].

## 2022-09-27 NOTE — Progress Notes (Signed)
I connected with  Matthew Clayton on 09/27/22 by a audio enabled telemedicine application and verified that I am speaking with the correct person using two identifiers.  Patient Location: Home  Provider Location: Office/Clinic  I discussed the limitations of evaluation and management by telemedicine. The patient expressed understanding and agreed to proceed.  Subjective:   Matthew Clayton is a 89 y.o. male who presents for Medicare Annual/Subsequent preventive examination.  Review of Systems    Cardiac Risk Factors include: advanced age (>65men, >28 women);dyslipidemia;hypertension;male gender;sedentary lifestyle    Objective:    Today's Vitals   09/27/22 1106  Weight: 145 lb (65.8 kg)  Height: 5\' 6"  (1.676 m)   Body mass index is 23.4 kg/m.     09/27/2022   11:13 AM 08/13/2022   10:41 AM 05/14/2022   10:43 AM 04/30/2022    1:35 AM 10/11/2021    3:59 PM 06/30/2021    1:17 PM 01/22/2021   11:20 AM  Advanced Directives  Does Patient Have a Medical Advance Directive? Yes Yes Yes Yes No No No;Yes  Type of Advance Directive Living will;Healthcare Power of [employer] Power of [place];Living will Healthcare Power of [place];Living will Healthcare Power of [place];Living will;Out of facility DNR (pink MOST or yellow form)   Healthcare Power of [place];Living will  Copy of Healthcare Power of Attorney in Chart?  No - copy requested     No - copy requested  Would patient like information on creating a medical advance directive?      No - Patient declined No - Patient declined    Current Medications (verified) Outpatient Encounter Medications as of 09/27/2022  Medication Sig   Biotin (BIOTIN 5000) 5 MG CAPS 5,000 mg.   cetirizine (ZYRTEC) 10 MG tablet Take 1 tablet (10 mg total) by mouth daily.   Multiple Vitamin (MULTIVITAMIN PO) Take by mouth daily.   rivaroxaban (XARELTO) 20 MG TABS tablet Take 1 tablet (20 mg total) by mouth daily with supper. On first day of treatment, only  take one 5 mg dose of Eliquis.  On second day of treatment discontinue Eliquis altogether.  Continue 20 mg daily thereafter.   simvastatin (ZOCOR) 40 MG tablet TAKE 1 TABLET BY MOUTH EVERY DAY   azelastine (ASTELIN) 0.1 % nasal spray Place 2 sprays into both nostrils daily. Use in each nostril as directed (Patient not taking: Reported on 09/27/2022)   COLLAGEN PO Take by mouth. (Patient not taking: Reported on 09/27/2022)   No facility-administered encounter medications on file as of 09/27/2022.    Allergies (verified) Patient has no known allergies.   History: Past Medical History:  Diagnosis Date   Cyst of knee joint, left    DVT (deep venous thrombosis) (HCC)    left leg   Hyperlipidemia    Past Surgical History:  Procedure Laterality Date   APPENDECTOMY     MOLE REMOVAL     PACEMAKER LEADLESS INSERTION N/A 12/09/2020   Procedure: PACEMAKER LEADLESS INSERTION;  Surgeon: Marcina Millard, MD;  Location: [facility] INVASIVE CV LAB;  Service: Cardiovascular;  Laterality: N/A;   PERIPHERAL VASCULAR THROMBECTOMY Left 05/03/2022   Procedure: PERIPHERAL VASCULAR THROMBECTOMY;  Surgeon: Annice Needy, MD;  Location: [facility] INVASIVE CV LAB;  Service: Cardiovascular;  Laterality: Left;   PULMONARY THROMBECTOMY Bilateral 01/12/2021   Procedure: PULMONARY THROMBECTOMY;  Surgeon: Annice Needy, MD;  Location: [facility] INVASIVE CV LAB;  Service: Cardiovascular;  Laterality: Bilateral;   TONSILLECTOMY AND ADENOIDECTOMY     TOTAL HIP ARTHROPLASTY  Bilateral    Family History  Problem Relation Age of Onset   Breast cancer Mother    Cancer Mother        eye cancer   Heart disease Father    Breast cancer Sister    Stroke Brother    Heart attack Brother    Breast cancer Sister    Aneurysm Sister    Social History   Socioeconomic History   Marital status: Married    Spouse name: Tamela Oddi   Number of children: 4   Years of education: bachelors   Highest education level: Bachelor's degree (e.g., BA, AB,  BS)  Occupational History   Occupation: Retired  Tobacco Use   Smoking status: Former    Packs/day: 1.50    Years: 20.00    Additional pack years: 0.00    Total pack years: 30.00    Types: Cigarettes    Quit date: 05/16/1960    Years since quitting: 62.4    Passive exposure: Never   Smokeless tobacco: Never   Tobacco comments:    quit in 1968  Substance and Sexual Activity   Alcohol use: No    Alcohol/week: 0.0 standard drinks of alcohol   Drug use: No   Sexual activity: Not on file  Other Topics Concern   Not on file  Social History Narrative   Not on file   Social Determinants of Health   Financial Resource Strain: Low Risk  (09/27/2022)   Overall Financial Resource Strain (CARDIA)    Difficulty of Paying Living Expenses: Not hard at all  Food Insecurity: No Food Insecurity (09/27/2022)   Hunger Vital Sign    Worried About Running Out of Food in the Last Year: Never true    Ran Out of Food in the Last Year: Never true  Transportation Needs: No Transportation Needs (09/27/2022)   PRAPARE - Administrator, Civil Service (Medical): No    Lack of Transportation (Non-Medical): No  Physical Activity: Inactive (09/27/2022)   Exercise Vital Sign    Days of Exercise per Week: 0 days    Minutes of Exercise per Session: 0 min  Stress: No Stress Concern Present (09/27/2022)   [employer] of Occupational Health - Occupational Stress Questionnaire    Feeling of Stress : Not at all  Social Connections: Socially Integrated (09/27/2022)   Social Connection and Isolation Panel [NHANES]    Frequency of Communication with Friends and Family: More than three times a week    Frequency of Social Gatherings with Friends and Family: Once a week    Attends Religious Services: More than 4 times per year    Active Member of [employer] or Organizations: Yes    Attends Engineer, structural: More than 4 times per year    Marital Status: Married    Tobacco  Counseling Counseling given: Not Answered Tobacco comments: quit in 1968   Clinical Intake:  Pre-visit preparation completed: Yes  Pain : No/denies pain     BMI - recorded: 23.4 Nutritional Status: BMI of 19-24  Normal Nutritional Risks: None Diabetes: No  How often do you need to have someone help you when you read instructions, pamphlets, or other written materials from your doctor or pharmacy?: 1 - Never  Diabetic?no  Interpreter Needed?: No  Comments: lives with wife Information entered by :: B.Syann Cupples,LPN   Activities of Daily Living    09/27/2022   11:14 AM 05/03/2022   10:22 AM  In your present state of health,  do you have any difficulty performing the following activities:  Hearing? 0 0  Vision? 1 0  Difficulty concentrating or making decisions? 1 0  Comment memory   Walking or climbing stairs? 0   Dressing or bathing? 0 0  Doing errands, shopping? 0   Preparing Food and eating ? N   Using the Toilet? N   In the past six months, have you accidently leaked urine? N   Do you have problems with loss of bowel control? N   Managing your Medications? N   Managing your Finances? N   Housekeeping or managing your Housekeeping? N     Patient Care Team: Ronnald Ramp, MD as PCP - General (Family Medicine) Schnier, Latina Craver, MD (Vascular Surgery) Irene Limbo., MD (Ophthalmology)  Indicate any recent Medical Services you may have received from other than [facility] providers in the past year (date may be approximate).     Assessment:   This is a routine wellness examination for Donald.  Hearing/Vision screen Hearing Screening - Comments:: Adequate hearing with hearing aides Vision Screening - Comments:: Has cataracts-appt removing in August  Dr Alvester Morin  Dietary issues and exercise activities discussed: Current Exercise Habits: The patient does not participate in regular exercise at present, Exercise limited by: Other - see comments  (age/interest)   Goals Addressed             This Visit's Progress    DIET - EAT MORE FRUITS AND VEGETABLES   On track    Increase water intake   On track    Recommend increasing water intake to 4 glasses of water a day.        Depression Screen    09/27/2022   11:11 AM 03/30/2022   10:56 AM 10/09/2021   11:09 AM 09/24/2021   10:40 AM 06/30/2021    1:14 PM 03/24/2021    1:50 PM 09/18/2020    2:21 PM  PHQ 2/9 Scores  PHQ - 2 Score 0 0 0 0 0 0 0  PHQ- 9 Score   1 1  0 0    Fall Risk    09/27/2022   11:09 AM 03/30/2022   10:55 AM 10/09/2021   11:08 AM 09/24/2021   10:40 AM 06/30/2021    1:18 PM  Fall Risk   Falls in the past year? 1 0 0 0 0  Number falls in past yr: 0 0 0 0 0  Injury with Fall? 0 0 0 0 0  Risk for fall due to : No Fall Risks No Fall Risks No Fall Risks No Fall Risks No Fall Risks  Follow up Education provided;Falls prevention discussed  Falls evaluation completed Falls evaluation completed Falls evaluation completed    FALL RISK PREVENTION PERTAINING TO THE HOME:  Any stairs in or around the home? Yes  If so, are there any without handrails? Yes  Home free of loose throw rugs in walkways, pet beds, electrical cords, etc? Yes  Adequate lighting in your home to reduce risk of falls? Yes   ASSISTIVE DEVICES UTILIZED TO PREVENT FALLS:  Life alert? No  Use of a cane, walker or w/c? Yes  Grab bars in the bathroom? Yes  Shower chair or bench in shower? No  Elevated toilet seat or a handicapped toilet? Yes    Cognitive Function:        09/27/2022   11:18 AM  6CIT Screen  What Year? 0 points  What month? 0 points  What time? 0  points  Count back from 20 0 points  Months in reverse 0 points  Repeat phrase 2 points  Total Score 2 points    Immunizations Immunization History  Administered Date(s) Administered   Fluad Quad(high Dose 65+) 02/28/2021   Hepatitis A, Adult 11/25/2015   Influenza, High Dose Seasonal PF 02/12/2016, 01/05/2017,  03/28/2018, 02/05/2019   Influenza-Unspecified 01/28/2020   PFIZER Comirnaty(Gray Top)Covid-19 Tri-Sucrose Vaccine 08/15/2020   PFIZER(Purple Top)SARS-COV-2 Vaccination 05/23/2019, 06/13/2019, 01/28/2020   Pneumococcal Conjugate-13 02/12/2016   Pneumococcal Polysaccharide-23 02/06/2019   Respiratory Syncytial Virus Vaccine,Recomb Aduvanted(Arexvy) 03/12/2022   Td 09/15/2012   Zoster, Live 05/23/2009    TDAP status: Up to date  Flu Vaccine status: Up to date  Pneumococcal vaccine status: Up to date  Covid-19 vaccine status: Completed vaccines  Qualifies for Shingles Vaccine? Yes   Zostavax completed Yes   Shingrix Completed?: Yes  Screening Tests Health Maintenance  Topic Date Due   Zoster Vaccines- Shingrix (1 of 2) Never done   COVID-19 Vaccine (5 - 2023-24 season) 01/15/2022   DTaP/Tdap/Td (2 - Tdap) 09/16/2022   INFLUENZA VACCINE  12/16/2022   Medicare Annual Wellness (AWV)  09/27/2023   Pneumonia Vaccine 95+ Years old  Completed   HPV VACCINES  Aged Out    Health Maintenance  Health Maintenance Due  Topic Date Due   Zoster Vaccines- Shingrix (1 of 2) Never done   COVID-19 Vaccine (5 - 2023-24 season) 01/15/2022   DTaP/Tdap/Td (2 - Tdap) 09/16/2022    Colorectal cancer screening: No longer required.   Lung Cancer Screening: (Low Dose CT Chest recommended if Age 69-80 years, 30 pack-year currently smoking OR have quit w/in 15years.) does not qualify.   Lung Cancer Screening Referral: no  Additional Screening:  Hepatitis C Screening: does not qualify; Completed yes  Vision Screening: Recommended annual ophthalmology exams for early detection of glaucoma and other disorders of the eye. Is the patient up to date with their annual eye exam?  Yes  Who is the provider or what is the name of the office in which the patient attends annual eye exams? [facility] If pt is not established with a provider, would they like to be referred to a provider to establish care?  No .   Dental Screening: Recommended annual dental exams for proper oral hygiene  Community Resource Referral / Chronic Care Management: CRR required this visit?  No   CCM required this visit?  No      Plan:     I have personally reviewed and noted the following in the patient's chart:   Medical and social history Use of alcohol, tobacco or illicit drugs  Current medications and supplements including opioid prescriptions. Patient is not currently taking opioid prescriptions. Functional ability and status Nutritional status Physical activity Advanced directives List of other physicians Hospitalizations, surgeries, and ER visits in previous 12 months Vitals Screenings to include cognitive, depression, and falls Referrals and appointments  In addition, I have reviewed and discussed with patient certain preventive protocols, quality metrics, and best practice recommendations. A written personalized care plan for preventive services as well as general preventive health recommendations were provided to patient.     Sue Lush, LPN   1/61/0960   Nurse Notes: The patient states they are doing well (he and wife) and has no concerns or questions at this time.

## 2022-09-27 NOTE — Telephone Encounter (Signed)
09/27/2022 10:57 AM EDT by Sue Lush, LPN  Outgoing Tykel, Wyne (Self) [phone] (Mobile) Remove  Left Message - left msg to rtn call for AWV  09/27/2022 10:58 AM EDT by Sue Lush, LPN  Outgoing Daryle, Baily (Self) [phone] (Home) Remove  "call cannot be completed as dialed"..unable to reah pt on this number

## 2022-10-13 DIAGNOSIS — Z85828 Personal history of other malignant neoplasm of skin: Secondary | ICD-10-CM | POA: Diagnosis not present

## 2022-10-13 DIAGNOSIS — D485 Neoplasm of uncertain behavior of skin: Secondary | ICD-10-CM | POA: Diagnosis not present

## 2022-10-13 DIAGNOSIS — Z08 Encounter for follow-up examination after completed treatment for malignant neoplasm: Secondary | ICD-10-CM | POA: Diagnosis not present

## 2022-10-13 DIAGNOSIS — C44629 Squamous cell carcinoma of skin of left upper limb, including shoulder: Secondary | ICD-10-CM | POA: Diagnosis not present

## 2022-10-13 DIAGNOSIS — L578 Other skin changes due to chronic exposure to nonionizing radiation: Secondary | ICD-10-CM | POA: Diagnosis not present

## 2022-10-26 ENCOUNTER — Other Ambulatory Visit: Payer: Self-pay | Admitting: Family Medicine

## 2022-10-26 NOTE — Telephone Encounter (Signed)
Medication Refill - Medication: Simvastatin 40 mg  Has the patient contacted their pharmacy? Yes.   (Agent: If no, request that the patient contact the pharmacy for the refill. If patient does not wish to contact the pharmacy document the reason why and proceed with request.) (Agent: If yes, when and what did the pharmacy advise?)  Preferred Pharmacy (with phone number [facility] street name[facility] [address] Has the patient been seen for an appointment in the last year OR does the patient have an upcoming appointment? Yes.    Agent: Please be advised that RX refills may take up to 3 business days. We ask that you follow-up with your pharmacy.

## 2022-10-27 MED ORDER — SIMVASTATIN 40 MG PO TABS: 40.0000 mg | ORAL_TABLET | Freq: Every day | ORAL | 1 refills | Status: DC

## 2022-10-27 NOTE — Telephone Encounter (Signed)
Requested Prescriptions  Pending Prescriptions Disp Refills   simvastatin (ZOCOR) 40 MG tablet 90 tablet 1    Sig: Take 1 tablet (40 mg total) by mouth daily.     Cardiovascular:  Antilipid - Statins Failed - 10/26/2022  5:40 PM      Failed - Lipid Panel in normal range within the last 12 months    Cholesterol, Total  Date Value Ref Range Status  03/30/2022 136 100 - 199 mg/dL Final   LDL Chol Calc (NIH)  Date Value Ref Range Status  03/30/2022 62 0 - 99 mg/dL Final   HDL  Date Value Ref Range Status  03/30/2022 60 >39 mg/dL Final   Triglycerides  Date Value Ref Range Status  03/30/2022 67 0 - 149 mg/dL Final         Passed - Patient is not pregnant      Passed - Valid encounter within last 12 months    Recent Outpatient Visits           1 month ago Cough in adult   [facility] Simmons-Robinson, [place], MD   7 months ago Brittle nails   [place] [facility] Simmons-Robinson, [place], MD   1 year ago Acute recurrent pansinusitis   [facility] Jacky Kindle, FNP   1 year ago Annual physical exam   [facility] Bosie Clos, MD   1 year ago COVID-19   [facility] Malva Limes, MD

## 2022-11-02 ENCOUNTER — Other Ambulatory Visit: Payer: Self-pay | Admitting: Family Medicine

## 2022-11-02 NOTE — Telephone Encounter (Signed)
Medication Refill - Medication: xarelto 20 mg  Has the patient contacted their pharmacy? Yes.   (Agent: If no, request that the patient contact the pharmacy for the refill. If patient does not wish to contact the pharmacy document the reason why and proceed with request.) (Agent: If yes, when and what did the pharmacy advise?)  Preferred Pharmacy (with phone number or street name[facility] [address] [place] Has the patient been seen for an appointment in the last year OR does the patient have an upcoming appointment? Yes.    Agent: Please be advised that RX refills may take up to 3 business days. We ask that you follow-up with your pharmacy.

## 2022-11-02 NOTE — Telephone Encounter (Signed)
Prescriber not at this practice  [facility]  Requested Prescriptions  Refused Prescriptions Disp Refills   rivaroxaban (XARELTO) 20 MG TABS tablet 90 tablet 3    Sig: Take 1 tablet (20 mg total) by mouth daily with supper. On first day of treatment, only take one 5 mg dose of Eliquis.  On second day of treatment discontinue Eliquis altogether.  Continue 20 mg daily thereafter.     Hematology: Anticoagulants - rivaroxaban Passed - 11/02/2022  9:56 AM      Passed - ALT in normal range and within 360 days    ALT  Date Value Ref Range Status  03/30/2022 14 0 - 44 IU/L Final         Passed - AST in normal range and within 360 days    AST  Date Value Ref Range Status  03/30/2022 18 0 - 40 IU/L Final         Passed - Cr in normal range and within 360 days    Creatinine, Ser  Date Value Ref Range Status  05/03/2022 1.20 0.61 - 1.24 mg/dL Final         Passed - HCT in normal range and within 360 days    HCT  Date Value Ref Range Status  04/30/2022 43.1 39.0 - 52.0 % Final   Hematocrit  Date Value Ref Range Status  03/30/2022 40.2 37.5 - 51.0 % Final         Passed - HGB in normal range and within 360 days    Hemoglobin  Date Value Ref Range Status  04/30/2022 13.8 13.0 - 17.0 g/dL Final  16/02/9603 54.0 13.0 - 17.7 g/dL Final         Passed - PLT in normal range and within 360 days    Platelets  Date Value Ref Range Status  04/30/2022 207 150 - 400 K/uL Final  03/30/2022 CANCELED x10E3/uL     Comment:    Unable to perform an accurate platelet count due to aggregation of the platelets.  Result canceled by the ancillary.          Passed - eGFR is 15 or above and within 360 days    GFR calc Af Amer  Date Value Ref Range Status  09/13/2019 47 (L) >59 mL/min/1.73 Final    Comment:    **[facility] currently reports eGFR in compliance with the current**   recommendations of the [employer]. [facility] will   update reporting as new guidelines are  published from the NKF-ASN   Task force.    GFR, Estimated  Date Value Ref Range Status  05/03/2022 56 (L) >60 mL/min Final    Comment:    (NOTE) Calculated using the CKD-EPI Creatinine Equation (2021) Performed at [facility], [address] [place], [place] [address]    eGFR  Date Value Ref Range Status  03/30/2022 54 (L) >59 mL/min/1.73 Final         Passed - Patient is not pregnant      Passed - Valid encounter within last 12 months    Recent Outpatient Visits           1 month ago Cough in adult   [facility] Simmons-Robinson, [place], MD   7 months ago Brittle nails   [place] [facility] Simmons-Robinson, [place], MD   1 year ago Acute recurrent pansinusitis   [place] [facility] Jacky Kindle, FNP   1 year  ago Annual physical exam   [facility] Bosie Clos, MD   1 year ago COVID-19   [facility] Sherrie Mustache, Demetrios Isaacs, MD

## 2022-11-03 ENCOUNTER — Other Ambulatory Visit: Payer: Self-pay | Admitting: Family Medicine

## 2022-11-03 MED ORDER — RIVAROXABAN 20 MG PO TABS: 20.0000 mg | ORAL_TABLET | Freq: Every day | ORAL | 3 refills | Status: DC

## 2022-11-03 NOTE — Telephone Encounter (Signed)
Patient called to report that Dr, Orlie Dakin has sent him to his PCP for his rivaroxaban Carlena Hurl) 20 MG TABS tablet   [id number], [place] - [address]  [address] [place] [address]  Phone: [phone] Fax: [phone]

## 2022-11-03 NOTE — Telephone Encounter (Signed)
Requested medication (s) are due for refill today: routing for review  Requested medication (s) are on the active medication list: yes  Last refill:  05/08/22  Future visit scheduled: no  Notes to clinic:  Patient called to report that Dr, Orlie Dakin has sent him to his PCP for refill, routing for review     Requested Prescriptions  Pending Prescriptions Disp Refills   rivaroxaban (XARELTO) 20 MG TABS tablet 90 tablet 3    Sig: Take 1 tablet (20 mg total) by mouth daily with supper. On first day of treatment, only take one 5 mg dose of Eliquis.  On second day of treatment discontinue Eliquis altogether.  Continue 20 mg daily thereafter.     Hematology: Anticoagulants - rivaroxaban Passed - 11/03/2022 10:09 AM      Passed - ALT in normal range and within 360 days    ALT  Date Value Ref Range Status  03/30/2022 14 0 - 44 IU/L Final         Passed - AST in normal range and within 360 days    AST  Date Value Ref Range Status  03/30/2022 18 0 - 40 IU/L Final         Passed - Cr in normal range and within 360 days    Creatinine, Ser  Date Value Ref Range Status  05/03/2022 1.20 0.61 - 1.24 mg/dL Final         Passed - HCT in normal range and within 360 days    HCT  Date Value Ref Range Status  04/30/2022 43.1 39.0 - 52.0 % Final   Hematocrit  Date Value Ref Range Status  03/30/2022 40.2 37.5 - 51.0 % Final         Passed - HGB in normal range and within 360 days    Hemoglobin  Date Value Ref Range Status  04/30/2022 13.8 13.0 - 17.0 g/dL Final  82/95/6213 08.6 13.0 - 17.7 g/dL Final         Passed - PLT in normal range and within 360 days    Platelets  Date Value Ref Range Status  04/30/2022 207 150 - 400 K/uL Final  03/30/2022 CANCELED x10E3/uL     Comment:    Unable to perform an accurate platelet count due to aggregation of the platelets.  Result canceled by the ancillary.          Passed - eGFR is 15 or above and within 360 days    GFR calc Af Amer   Date Value Ref Range Status  09/13/2019 47 (L) >59 mL/min/1.73 Final    Comment:    **[facility] currently reports eGFR in compliance with the current**   recommendations of the [employer]. [facility] will   update reporting as new guidelines are published from the NKF-ASN   Task force.    GFR, Estimated  Date Value Ref Range Status  05/03/2022 56 (L) >60 mL/min Final    Comment:    (NOTE) Calculated using the CKD-EPI Creatinine Equation (2021) Performed at [facility], [address] [place], [place] [address]    eGFR  Date Value Ref Range Status  03/30/2022 54 (L) >59 mL/min/1.73 Final         Passed - Patient is not pregnant      Passed - Valid encounter within last 12 months    Recent Outpatient Visits           1 month ago Cough in adult   [facility] Simmons-Robinson, [place], MD   7 months ago Brittle nails   [facility] Weott, [place], MD   1 year ago Acute recurrent pansinusitis   [facility] Jacky Kindle, FNP   1 year ago Annual physical exam   [facility] Bosie Clos, MD   1 year ago COVID-19   [facility] Malva Limes, MD

## 2022-12-08 DIAGNOSIS — R001 Bradycardia, unspecified: Secondary | ICD-10-CM | POA: Diagnosis not present

## 2022-12-08 DIAGNOSIS — R0989 Other specified symptoms and signs involving the circulatory and respiratory systems: Secondary | ICD-10-CM | POA: Diagnosis not present

## 2022-12-08 DIAGNOSIS — E785 Hyperlipidemia, unspecified: Secondary | ICD-10-CM | POA: Diagnosis not present

## 2022-12-08 DIAGNOSIS — I495 Sick sinus syndrome: Secondary | ICD-10-CM | POA: Diagnosis not present

## 2022-12-08 DIAGNOSIS — Z86711 Personal history of pulmonary embolism: Secondary | ICD-10-CM | POA: Diagnosis not present

## 2022-12-08 DIAGNOSIS — Z86718 Personal history of other venous thrombosis and embolism: Secondary | ICD-10-CM | POA: Diagnosis not present

## 2022-12-08 DIAGNOSIS — I499 Cardiac arrhythmia, unspecified: Secondary | ICD-10-CM | POA: Diagnosis not present

## 2022-12-08 DIAGNOSIS — I10 Essential (primary) hypertension: Secondary | ICD-10-CM | POA: Diagnosis not present

## 2022-12-08 DIAGNOSIS — I82593 Chronic embolism and thrombosis of other specified deep vein of lower extremity, bilateral: Secondary | ICD-10-CM | POA: Diagnosis not present

## 2022-12-21 DIAGNOSIS — H2513 Age-related nuclear cataract, bilateral: Secondary | ICD-10-CM | POA: Diagnosis not present

## 2022-12-21 DIAGNOSIS — H25013 Cortical age-related cataract, bilateral: Secondary | ICD-10-CM | POA: Diagnosis not present

## 2022-12-21 DIAGNOSIS — H18413 Arcus senilis, bilateral: Secondary | ICD-10-CM | POA: Diagnosis not present

## 2022-12-21 DIAGNOSIS — H2512 Age-related nuclear cataract, left eye: Secondary | ICD-10-CM | POA: Diagnosis not present

## 2022-12-21 DIAGNOSIS — H25043 Posterior subcapsular polar age-related cataract, bilateral: Secondary | ICD-10-CM | POA: Diagnosis not present

## 2023-02-08 DIAGNOSIS — D2362 Other benign neoplasm of skin of left upper limb, including shoulder: Secondary | ICD-10-CM | POA: Diagnosis not present

## 2023-02-08 DIAGNOSIS — C44629 Squamous cell carcinoma of skin of left upper limb, including shoulder: Secondary | ICD-10-CM | POA: Diagnosis not present

## 2023-02-18 DIAGNOSIS — Z48817 Encounter for surgical aftercare following surgery on the skin and subcutaneous tissue: Secondary | ICD-10-CM | POA: Diagnosis not present

## 2023-02-28 DIAGNOSIS — H2512 Age-related nuclear cataract, left eye: Secondary | ICD-10-CM | POA: Diagnosis not present

## 2023-03-01 DIAGNOSIS — H2511 Age-related nuclear cataract, right eye: Secondary | ICD-10-CM | POA: Diagnosis not present

## 2023-03-14 DIAGNOSIS — H25011 Cortical age-related cataract, right eye: Secondary | ICD-10-CM | POA: Diagnosis not present

## 2023-03-14 DIAGNOSIS — H2511 Age-related nuclear cataract, right eye: Secondary | ICD-10-CM | POA: Diagnosis not present

## 2023-03-14 DIAGNOSIS — H25041 Posterior subcapsular polar age-related cataract, right eye: Secondary | ICD-10-CM | POA: Diagnosis not present

## 2023-03-23 DIAGNOSIS — H20043 Secondary noninfectious iridocyclitis, bilateral: Secondary | ICD-10-CM | POA: Diagnosis not present

## 2023-03-28 DIAGNOSIS — H16222 Keratoconjunctivitis sicca, not specified as Sjogren's, left eye: Secondary | ICD-10-CM | POA: Diagnosis not present

## 2023-04-11 ENCOUNTER — Other Ambulatory Visit: Payer: Self-pay | Admitting: Family Medicine

## 2023-05-19 DIAGNOSIS — H16142 Punctate keratitis, left eye: Secondary | ICD-10-CM | POA: Diagnosis not present

## 2023-05-27 DIAGNOSIS — W19XXXA Unspecified fall, initial encounter: Secondary | ICD-10-CM | POA: Diagnosis not present

## 2023-05-27 DIAGNOSIS — M7022 Olecranon bursitis, left elbow: Secondary | ICD-10-CM | POA: Diagnosis not present

## 2023-05-27 DIAGNOSIS — M25521 Pain in right elbow: Secondary | ICD-10-CM | POA: Diagnosis not present

## 2023-06-07 ENCOUNTER — Other Ambulatory Visit: Payer: Self-pay | Admitting: Family Medicine

## 2023-06-09 DIAGNOSIS — I495 Sick sinus syndrome: Secondary | ICD-10-CM | POA: Diagnosis not present

## 2023-06-09 DIAGNOSIS — E785 Hyperlipidemia, unspecified: Secondary | ICD-10-CM | POA: Diagnosis not present

## 2023-06-09 DIAGNOSIS — I10 Essential (primary) hypertension: Secondary | ICD-10-CM | POA: Diagnosis not present

## 2023-06-09 DIAGNOSIS — Z86718 Personal history of other venous thrombosis and embolism: Secondary | ICD-10-CM | POA: Diagnosis not present

## 2023-06-09 DIAGNOSIS — R0989 Other specified symptoms and signs involving the circulatory and respiratory systems: Secondary | ICD-10-CM | POA: Diagnosis not present

## 2023-06-09 DIAGNOSIS — R001 Bradycardia, unspecified: Secondary | ICD-10-CM | POA: Diagnosis not present

## 2023-06-09 DIAGNOSIS — I499 Cardiac arrhythmia, unspecified: Secondary | ICD-10-CM | POA: Diagnosis not present

## 2023-06-09 DIAGNOSIS — Z86711 Personal history of pulmonary embolism: Secondary | ICD-10-CM | POA: Diagnosis not present

## 2023-06-09 DIAGNOSIS — I82593 Chronic embolism and thrombosis of other specified deep vein of lower extremity, bilateral: Secondary | ICD-10-CM | POA: Diagnosis not present

## 2023-06-23 DIAGNOSIS — D485 Neoplasm of uncertain behavior of skin: Secondary | ICD-10-CM | POA: Diagnosis not present

## 2023-06-23 DIAGNOSIS — C44729 Squamous cell carcinoma of skin of left lower limb, including hip: Secondary | ICD-10-CM | POA: Diagnosis not present

## 2023-07-12 ENCOUNTER — Telehealth (INDEPENDENT_AMBULATORY_CARE_PROVIDER_SITE_OTHER): Payer: Self-pay

## 2023-07-12 NOTE — Telephone Encounter (Signed)
 Matthew Clayton called concerned about having a DVT. He stated he is prone to getting clots, and his left leg has given him problems in the past. He stated Dr.Dew told him before that it is beyond normal and more then what he like to see. Patient would like an appt to get a [place] to see if he is forming another blood clot. .  Please advise

## 2023-07-12 NOTE — Telephone Encounter (Signed)
 We can get him on for [place] for DVT to work in if positive

## 2023-07-13 ENCOUNTER — Telehealth (INDEPENDENT_AMBULATORY_CARE_PROVIDER_SITE_OTHER): Payer: Self-pay | Admitting: Nurse Practitioner

## 2023-07-13 ENCOUNTER — Other Ambulatory Visit (INDEPENDENT_AMBULATORY_CARE_PROVIDER_SITE_OTHER): Payer: Self-pay | Admitting: Nurse Practitioner

## 2023-07-13 DIAGNOSIS — M79605 Pain in left leg: Secondary | ICD-10-CM

## 2023-07-13 DIAGNOSIS — Z86718 Personal history of other venous thrombosis and embolism: Secondary | ICD-10-CM

## 2023-07-13 NOTE — Telephone Encounter (Signed)
 LVM for patient on cell phone to schedule DVT US only per FB. She will work patient into schedule if positive. Tried to call home number. No answer and no voicemail.

## 2023-07-14 ENCOUNTER — Ambulatory Visit (INDEPENDENT_AMBULATORY_CARE_PROVIDER_SITE_OTHER): Payer: PPO

## 2023-07-14 DIAGNOSIS — M79605 Pain in left leg: Secondary | ICD-10-CM | POA: Diagnosis not present

## 2023-07-14 DIAGNOSIS — Z86718 Personal history of other venous thrombosis and embolism: Secondary | ICD-10-CM | POA: Diagnosis not present

## 2023-07-26 DIAGNOSIS — L578 Other skin changes due to chronic exposure to nonionizing radiation: Secondary | ICD-10-CM | POA: Diagnosis not present

## 2023-07-26 DIAGNOSIS — L814 Other melanin hyperpigmentation: Secondary | ICD-10-CM | POA: Diagnosis not present

## 2023-07-26 DIAGNOSIS — C44729 Squamous cell carcinoma of skin of left lower limb, including hip: Secondary | ICD-10-CM | POA: Diagnosis not present

## 2023-07-29 DIAGNOSIS — L608 Other nail disorders: Secondary | ICD-10-CM | POA: Diagnosis not present

## 2023-07-29 DIAGNOSIS — Z85828 Personal history of other malignant neoplasm of skin: Secondary | ICD-10-CM | POA: Diagnosis not present

## 2023-07-29 DIAGNOSIS — Z08 Encounter for follow-up examination after completed treatment for malignant neoplasm: Secondary | ICD-10-CM | POA: Diagnosis not present

## 2023-07-29 DIAGNOSIS — L821 Other seborrheic keratosis: Secondary | ICD-10-CM | POA: Diagnosis not present

## 2023-07-29 DIAGNOSIS — L570 Actinic keratosis: Secondary | ICD-10-CM | POA: Diagnosis not present

## 2023-08-03 DIAGNOSIS — I495 Sick sinus syndrome: Secondary | ICD-10-CM | POA: Diagnosis not present

## 2023-08-03 DIAGNOSIS — Z950 Presence of cardiac pacemaker: Secondary | ICD-10-CM | POA: Diagnosis not present

## 2023-09-21 ENCOUNTER — Telehealth: Payer: Self-pay

## 2023-09-21 NOTE — Telephone Encounter (Signed)
 Copied from CRM [id number]. Topic: Clinical - Prescription Issue >> Sep 21, 2023 12:36 PM Elle L wrote: Reason for CRM: The patient is requesting for his future refills of his rivaroxaban  (XARELTO ) 20 MG TABS tablet prescription to be sent in as 3 month supplies.

## 2023-09-22 ENCOUNTER — Telehealth: Payer: Self-pay | Admitting: Family Medicine

## 2023-09-22 NOTE — Telephone Encounter (Signed)
 Spoke to patient and got him scheduled for a medication/refill office appt on 10/03/23 @ 2pm

## 2023-09-23 ENCOUNTER — Other Ambulatory Visit: Payer: Self-pay | Admitting: Family Medicine

## 2023-09-26 ENCOUNTER — Encounter (HOSPITAL_COMMUNITY): Payer: Self-pay

## 2023-09-28 ENCOUNTER — Ambulatory Visit: Payer: Self-pay

## 2023-09-28 DIAGNOSIS — Z0000 Encounter for general adult medical examination without abnormal findings: Secondary | ICD-10-CM

## 2023-09-28 NOTE — Progress Notes (Signed)
 Subjective:   POE Matthew Clayton is a 89 y.o. who presents for a Medicare Wellness preventive visit.  As a reminder, Annual Wellness Visits don't include a physical exam, and some assessments may be limited, especially if this visit is performed virtually. We may recommend an in-person visit if needed.  Visit Complete: Virtual I connected with  RAYFORD PODGORNY on 09/28/23 by a audio enabled telemedicine application and verified that I am speaking with the correct person using two identifiers.  Patient Location: Home  Provider Location: Home Office  I discussed the limitations of evaluation and management by telemedicine. The patient expressed understanding and agreed to proceed.  Vital Signs: Because this visit was a virtual/telehealth visit, some criteria may be missing or patient reported. Any vitals not documented were not able to be obtained and vitals that have been documented are patient reported.  VideoDeclined- This patient declined Librarian, academic. Therefore the visit was completed with audio only.  Persons Participating in Visit: Patient.  AWV Questionnaire: No: Patient Medicare AWV questionnaire was not completed prior to this visit.  Cardiac Risk Factors include: advanced age (>23men, >57 women);dyslipidemia;male gender;hypertension;sedentary lifestyle     Objective:     There were no vitals filed for this visit. There is no height or weight on file to calculate BMI.     09/28/2023   10:57 AM 09/27/2022   11:13 AM 08/13/2022   10:41 AM 05/14/2022   10:43 AM 04/30/2022    1:35 AM 10/11/2021    3:59 PM 06/30/2021    1:17 PM  Advanced Directives  Does Patient Have a Medical Advance Directive? No Yes Yes Yes Yes No No  Type of Advance Directive  Living will;Healthcare Power of [employer] Power of [place];Living will Healthcare Power of [place];Living will Healthcare Power of [place];Living will;Out of facility DNR (pink MOST or  yellow form)    Copy of Healthcare Power of Attorney in Chart?   No - copy requested      Would patient like information on creating a medical advance directive? No - Patient declined      No - Patient declined    Current Medications (verified) Outpatient Encounter Medications as of 09/28/2023  Medication Sig   Biotin (BIOTIN 5000) 5 MG CAPS 5,000 mg.   Multiple Vitamin (MULTIVITAMIN PO) Take by mouth daily.   rivaroxaban  (XARELTO ) 20 MG TABS tablet Take 1 tablet (20 mg total) by mouth daily with supper. On first day of treatment, only take one 5 mg dose of Eliquis .  On second day of treatment discontinue Eliquis  altogether.  Continue 20 mg daily thereafter.   simvastatin  (ZOCOR ) 40 MG tablet TAKE 1 TABLET BY MOUTH EVERY DAY   [DISCONTINUED] azelastine  (ASTELIN ) 0.1 % nasal spray Place 2 sprays into both nostrils daily. Use in each nostril as directed (Patient not taking: Reported on 09/27/2022)   [DISCONTINUED] cetirizine  (ZYRTEC ) 10 MG tablet Take 1 tablet (10 mg total) by mouth daily.   [DISCONTINUED] COLLAGEN PO Take by mouth. (Patient not taking: Reported on 09/27/2022)   No facility-administered encounter medications on file as of 09/28/2023.    Allergies (verified) Patient has no known allergies.   History: Past Medical History:  Diagnosis Date   Cyst of knee joint, left    DVT (deep venous thrombosis) (HCC)    left leg   Hyperlipidemia    Past Surgical History:  Procedure Laterality Date   APPENDECTOMY     MOLE REMOVAL     PACEMAKER  LEADLESS INSERTION N/A 12/09/2020   Procedure: PACEMAKER LEADLESS INSERTION;  Surgeon: Percival Brace, MD;  Location: [facility] INVASIVE CV LAB;  Service: Cardiovascular;  Laterality: N/A;   PERIPHERAL VASCULAR THROMBECTOMY Left 05/03/2022   Procedure: PERIPHERAL VASCULAR THROMBECTOMY;  Surgeon: Celso College, MD;  Location: [facility] INVASIVE CV LAB;  Service: Cardiovascular;  Laterality: Left;   PULMONARY THROMBECTOMY Bilateral 01/12/2021    Procedure: PULMONARY THROMBECTOMY;  Surgeon: Celso College, MD;  Location: [facility] INVASIVE CV LAB;  Service: Cardiovascular;  Laterality: Bilateral;   TONSILLECTOMY AND ADENOIDECTOMY     TOTAL HIP ARTHROPLASTY Bilateral    Family History  Problem Relation Age of Onset   Breast cancer Mother    Cancer Mother        eye cancer   Heart disease Father    Breast cancer Sister    Stroke Brother    Heart attack Brother    Breast cancer Sister    Aneurysm Sister    Social History   Socioeconomic History   Marital status: Married    Spouse name: Newton Barer   Number of children: 4   Years of education: bachelors   Highest education level: Bachelor's degree (e.g., BA, AB, BS)  Occupational History   Occupation: Retired  Tobacco Use   Smoking status: Former    Current packs/day: 0.00    Average packs/day: 1.5 packs/day for 20.0 years (30.0 ttl pk-yrs)    Types: Cigarettes    Start date: 05/16/1940    Quit date: 05/16/1960    Years since quitting: 63.4    Passive exposure: Never   Smokeless tobacco: Never   Tobacco comments:    quit in 1968  Substance and Sexual Activity   Alcohol use: No    Alcohol/week: 0.0 standard drinks of alcohol   Drug use: No   Sexual activity: Not on file  Other Topics Concern   Not on file  Social History Narrative   Not on file   Social Drivers of Health   Financial Resource Strain: Low Risk  (09/28/2023)   Overall Financial Resource Strain (CARDIA)    Difficulty of Paying Living Expenses: Not hard at all  Food Insecurity: No Food Insecurity (09/28/2023)   Hunger Vital Sign    Worried About Running Out of Food in the Last Year: Never true    Ran Out of Food in the Last Year: Never true  Transportation Needs: No Transportation Needs (09/28/2023)   PRAPARE - Administrator, Civil Service (Medical): No    Lack of Transportation (Non-Medical): No  Physical Activity: Inactive (09/28/2023)   Exercise Vital Sign    Days of Exercise per Week: 0  days    Minutes of Exercise per Session: 0 min  Stress: No Stress Concern Present (09/28/2023)   [employer] of Occupational Health - Occupational Stress Questionnaire    Feeling of Stress : Not at all  Social Connections: Moderately Integrated (09/28/2023)   Social Connection and Isolation Panel [NHANES]    Frequency of Communication with Friends and Family: Three times a week    Frequency of Social Gatherings with Friends and Family: Three times a week    Attends Religious Services: More than 4 times per year    Active Member of Clubs or Organizations: No    Attends Banker Meetings: Never    Marital Status: Married    Tobacco Counseling Counseling given: Not Answered Tobacco comments: quit in 1968    Clinical Intake:  Pre-visit preparation completed: Yes  Pain : No/denies pain     BMI - recorded: 23.4 Nutritional Status: BMI of 19-24  Normal Nutritional Risks: None Diabetes: No  No results found for: "HGBA1C"   How often do you need to have someone help you when you read instructions, pamphlets, or other written materials from your doctor or pharmacy?: 1 - Never  Interpreter Needed?: No  Information entered by :: Dellie Fergusson, LPN   Activities of Daily Living    09/28/2023   10:58 AM 09/26/2023    4:18 PM  In your present state of health, do you have any difficulty performing the following activities:  Hearing? 1 1  Vision? 0 0  Difficulty concentrating or making decisions? 0 0  Walking or climbing stairs? 1 1  Dressing or bathing? 0 0  Doing errands, shopping? 0 0  Preparing Food and eating ? N N  Using the Toilet? N N  In the past six months, have you accidently leaked urine? N N  Do you have problems with loss of bowel control? N N  Managing your Medications? N N  Managing your Finances? N N  Housekeeping or managing your Housekeeping? N N    Patient Care Team: Mimi Alt, MD as PCP - General (Family  Medicine) Schnier, Ninette Basque, MD (Vascular Surgery) Bary Boss., MD (Ophthalmology)  Indicate any recent Medical Services you may have received from other than [facility] providers in the past year (date may be approximate).     Assessment:    This is a routine wellness examination for Beryl.  Hearing/Vision screen Hearing Screening - Comments:: WEARS AIDS, BOTH EARS Vision Screening - Comments:: WEARS GLASSES ALL DAY- DR.[email or web address]   Goals Addressed             This Visit's Progress    DIET - REDUCE SALT INTAKE TO 2 GRAMS PER DAY OR LESS         Depression Screen     09/28/2023   10:56 AM 09/27/2022   11:11 AM 03/30/2022   10:56 AM 10/09/2021   11:09 AM 09/24/2021   10:40 AM 06/30/2021    1:14 PM 03/24/2021    1:50 PM  PHQ 2/9 Scores  PHQ - 2 Score 0 0 0 0 0 0 0  PHQ- 9 Score 0   1 1  0    Fall Risk     09/28/2023   10:58 AM 09/26/2023    4:18 PM 09/27/2022   11:09 AM 03/30/2022   10:55 AM 10/09/2021   11:08 AM  Fall Risk   Falls in the past year? 1 1 1  0 0  Number falls in past yr: 0 0 0 0 0  Injury with Fall? 0 0 0 0 0  Risk for fall due to :   No Fall Risks No Fall Risks No Fall Risks  Follow up Falls evaluation completed;Falls prevention discussed  Education provided;Falls prevention discussed  Falls evaluation completed    MEDICARE RISK AT HOME:  Medicare Risk at Home Any stairs in or around the home?: No If so, are there any without handrails?: No Home free of loose throw rugs in walkways, pet beds, electrical cords, etc?: Yes Adequate lighting in your home to reduce risk of falls?: Yes Life alert?: Yes Use of a cane, walker or w/c?: Yes (CANE ALL THE TIME) Grab bars in the bathroom?: Yes Shower chair or bench in shower?: No Elevated toilet seat or a handicapped toilet?: Yes  TIMED UP AND GO:  Was  the test performed?  No  Cognitive Function: 6CIT completed        09/27/2022   11:18 AM  6CIT Screen  What Year? 0 points  What month? 0 points  What  time? 0 points  Count back from 20 0 points  Months in reverse 0 points  Repeat phrase 2 points  Total Score 2 points    Immunizations Immunization History  Administered Date(s) Administered   Fluad Quad(high Dose 65+) 02/28/2021   Hepatitis A, Adult 11/25/2015   Influenza, High Dose Seasonal PF 02/12/2016, 01/05/2017, 03/28/2018, 02/05/2019   Influenza-Unspecified 01/28/2020   Moderna Covid-19 Fall Seasonal Vaccine 1yrs & older 03/09/2022   PFIZER Comirnaty(Gray Top)Covid-19 Tri-Sucrose Vaccine 08/15/2020   PFIZER(Purple Top)SARS-COV-2 Vaccination 05/23/2019, 06/13/2019, 01/28/2020   Pneumococcal Conjugate-13 02/12/2016   Pneumococcal Polysaccharide-23 02/06/2019   Respiratory Syncytial Virus Vaccine,Recomb Aduvanted(Arexvy) 03/12/2022   Td 09/15/2012   Zoster, Live 05/23/2009    Screening Tests Health Maintenance  Topic Date Due   Zoster Vaccines- Shingrix (1 of 2) 12/13/1945   DTaP/Tdap/Td (2 - Tdap) 09/16/2022   COVID-19 Vaccine (6 - 2024-25 season) 01/16/2023   INFLUENZA VACCINE  12/16/2023   Medicare Annual Wellness (AWV)  09/27/2024   Pneumonia Vaccine 18+ Years old  Completed   HPV VACCINES  Aged Out   Meningococcal B Vaccine  Aged Out    Health Maintenance  Health Maintenance Due  Topic Date Due   Zoster Vaccines- Shingrix (1 of 2) 12[birth year]13/1945   DTaP/Tdap/Td (2 - Tdap) 09/16/2022   COVID-19 Vaccine (6 - 2024-25 season) 01/16/2023   Health Maintenance Items Addressed: NEEDS TDAP, NEEDS PNA & SHINGRIX  Additional Screening:  Vision Screening: Recommended annual ophthalmology exams for early detection of glaucoma and other disorders of the eye.  Dental Screening: Recommended annual dental exams for proper oral hygiene  Community Resource Referral / Chronic Care Management: CRR required this visit?  No   CCM required this visit?  No   Plan:    I have personally reviewed and noted the following in the patient's chart:   Medical and social  history Use of alcohol, tobacco or illicit drugs  Current medications and supplements including opioid prescriptions. Patient is not currently taking opioid prescriptions. Functional ability and status Nutritional status Physical activity Advanced directives List of other physicians Hospitalizations, surgeries, and ER visits in previous 12 months Vitals Screenings to include cognitive, depression, and falls Referrals and appointments  In addition, I have reviewed and discussed with patient certain preventive protocols, quality metrics, and best practice recommendations. A written personalized care plan for preventive services as well as general preventive health recommendations were provided to patient.   Pinky Bright, LPN   01/13/5620   After Visit Summary: (MyChart) Due to this being a telephonic visit, the after visit summary with patients personalized plan was offered to patient via MyChart   Notes: Nothing significant to report at this time.

## 2023-09-28 NOTE — Patient Instructions (Addendum)
 Matthew Clayton , Thank you for taking time out of your busy schedule to complete your Annual Wellness Visit with me. I enjoyed our conversation and look forward to speaking with you again next year. I, as well as your care team,  appreciate your ongoing commitment to your health goals. Please review the following plan we discussed and let me know if I can assist you in the future.   Follow up Visits: Next Medicare AWV with our clinical staff:   10/09/24 @ 10:10 AM BY PHONE Have you seen your provider in the last 6 months (3 months if uncontrolled diabetes)? Yes Next Office Visit with your provider: MONDAY  Clinician Recommendations:  Aim for 30 minutes of exercise or brisk walking, 6-8 glasses of water, and 5 servings of fruits and vegetables each day. KEEP TAKING CARE OF YOURSELF!      This is a list of the screening recommended for you and due dates:  Health Maintenance  Topic Date Due   Zoster (Shingles) Vaccine (1 of 2) 12[birth year]13/1945   DTaP/Tdap/Td vaccine (2 - Tdap) 09/16/2022   COVID-19 Vaccine (6 - 2024-25 season) 01/16/2023   Flu Shot  12/16/2023   Medicare Annual Wellness Visit  09/27/2024   Pneumonia Vaccine  Completed   HPV Vaccine  Aged Out   Meningitis B Vaccine  Aged Out    Advanced directives: (ACP Link)Information on Advanced Care Planning can be found at [place]  Secretary of [employer] Advance Health Care Directives Advance Health Care Directives. [email or web address]  Advance Care Planning is important because it:  [x]  Makes sure you receive the medical care that is consistent with your values, goals, and preferences  [x]  It provides guidance to your family and loved ones and reduces their decisional burden about whether or not they are making the right decisions based on your wishes.  Follow the link provided in your after visit summary or read over the paperwork we have mailed to you to help you started getting your Advance Directives in place. If you need assistance in completing  these, please reach out to us  so that we can help you!

## 2023-10-03 ENCOUNTER — Ambulatory Visit (INDEPENDENT_AMBULATORY_CARE_PROVIDER_SITE_OTHER): Admitting: Family Medicine

## 2023-10-03 ENCOUNTER — Encounter: Payer: Self-pay | Admitting: Family Medicine

## 2023-10-03 VITALS — BP 105/54 | HR 70 | Ht 66.0 in | Wt 150.0 lb

## 2023-10-03 DIAGNOSIS — I10 Essential (primary) hypertension: Secondary | ICD-10-CM | POA: Diagnosis not present

## 2023-10-03 DIAGNOSIS — Z7901 Long term (current) use of anticoagulants: Secondary | ICD-10-CM | POA: Diagnosis not present

## 2023-10-03 DIAGNOSIS — I2699 Other pulmonary embolism without acute cor pulmonale: Secondary | ICD-10-CM

## 2023-10-03 DIAGNOSIS — I358 Other nonrheumatic aortic valve disorders: Secondary | ICD-10-CM | POA: Diagnosis not present

## 2023-10-03 DIAGNOSIS — E7800 Pure hypercholesterolemia, unspecified: Secondary | ICD-10-CM

## 2023-10-03 MED ORDER — RIVAROXABAN 20 MG PO TABS: 20.0000 mg | ORAL_TABLET | Freq: Every day | ORAL | 3 refills | Status: AC

## 2023-10-03 NOTE — Progress Notes (Signed)
 Established patient visit   Patient: Matthew Clayton   DOB: 10-Jan-1927   89 y.o. Male  MRN: [id number] Visit Date: 10/03/2023  Today's healthcare provider: Mimi Alt, MD   Chief Complaint  Patient presents with   Medication Refill   Subjective       Discussed the use of AI scribe software for clinical note transcription with the patient, who gave verbal consent to proceed.  History of Present Illness Matthew Clayton is a 89 year old male who presents for medication management.  He is currently taking simvastatin  40 mg daily for hyperlipidemia and Xarelto  20 mg daily. He also takes biotin 5 mg capsules and a multivitamin daily. He prefers a 90-day supply of Xarelto  to minimize pharmacy visits.  He believes Xarelto  has caused hair loss and nail dystrophy, describing his fingernails as thin, curled, and ridged, with tenderness and exposed nerves. Despite these changes, his fingernails are not growing back. His dermatologist has not encountered these symptoms before.  He has a history of pulmonary embolism and was previously on Eliquis  for 14 months, which he states 'quit working,' leading to another clot and the switch to Xarelto . He describes waking up with a clot in his leg, which required hospital intervention.  He has a history of skin cancer, with recent treatment at [facility] for Mohs surgery on his leg, and reports applying Vaseline daily to the area.  He has a pacemaker with eight years of battery life remaining and is approaching his 89th birthday in a little over two months.     Past Medical History:  Diagnosis Date   Cyst of knee joint, left    DVT (deep venous thrombosis) (HCC)    left leg   Hyperlipidemia     Medications: Outpatient Medications Prior to Visit  Medication Sig   Biotin (BIOTIN 5000) 5 MG CAPS 5,000 mg.   Multiple Vitamin (MULTIVITAMIN PO) Take by mouth daily.   simvastatin  (ZOCOR ) 40 MG tablet TAKE 1 TABLET BY MOUTH EVERY DAY    [DISCONTINUED] rivaroxaban  (XARELTO ) 20 MG TABS tablet Take 1 tablet (20 mg total) by mouth daily with supper. On first day of treatment, only take one 5 mg dose of Eliquis .  On second day of treatment discontinue Eliquis  altogether.  Continue 20 mg daily thereafter.   No facility-administered medications prior to visit.    Review of Systems  Last CBC Lab Results  Component Value Date   WBC 8.7 04/30/2022   HGB 13.8 04/30/2022   HCT 43.1 04/30/2022   MCV 100.5 (H) 04/30/2022   MCH 32.2 04/30/2022   RDW 13.3 04/30/2022   PLT 207 04/30/2022   Last metabolic panel Lab Results  Component Value Date   GLUCOSE 106 (H) 04/30/2022   NA 140 04/30/2022   K 4.0 04/30/2022   CL 106 04/30/2022   CO2 26 04/30/2022   BUN 20 05/03/2022   CREATININE 1.20 05/03/2022   GFRNONAA 56 (L) 05/03/2022   CALCIUM 9.6 04/30/2022   PROT 7.2 03/30/2022   ALBUMIN 4.3 03/30/2022   LABGLOB 2.9 03/30/2022   AGRATIO 1.5 03/30/2022   BILITOT 0.5 03/30/2022   ALKPHOS 72 03/30/2022   AST 18 03/30/2022   ALT 14 03/30/2022   ANIONGAP 8 04/30/2022   Last lipids Lab Results  Component Value Date   CHOL 136 03/30/2022   HDL 60 03/30/2022   LDLCALC 62 03/30/2022   TRIG 67 03/30/2022   CHOLHDL 2.3 03/30/2022   Last hemoglobin  A1c No results found for: "HGBA1C" Last thyroid  functions Lab Results  Component Value Date   TSH 1.980 03/30/2022   Last vitamin D No results found for: "25OHVITD2", "25OHVITD3", "VD25OH" Last vitamin B12 and Folate Lab Results  Component Value Date   VITAMINB12 565 09/24/2021   FOLATE >20.0 09/24/2021        Objective    BP (!) 105/54   Pulse 70   Ht 5\' 6"  (1.676 m)   Wt 150 lb (68 kg)   SpO2 98%   BMI 24.21 kg/m  BP Readings from Last 3 Encounters:  10/03/23 (!) 105/54  09/13/22 101/65  08/13/22 112/75   Wt Readings from Last 3 Encounters:  10/03/23 150 lb (68 kg)  09/27/22 145 lb (65.8 kg)  09/13/22 147 lb (66.7 kg)        Physical  Exam Constitutional:      General: He is not in acute distress.    Appearance: He is not ill-appearing.  Cardiovascular:     Rate and Rhythm: Regular rhythm. Bradycardia present.  Musculoskeletal:     Right lower leg: No edema.     Left lower leg: Edema present.  Skin:    Comments: Diffusely thinning hair   Neurological:     Mental Status: He is alert and oriented to person, place, and time.      No results found for any visits on 10/03/23.  Assessment & Plan     Problem List Items Addressed This Visit       Cardiovascular and Mediastinum   Pulmonary embolism (HCC) - Primary   Relevant Medications   rivaroxaban  (XARELTO ) 20 MG TABS tablet   Other Relevant Orders   CBC   Essential hypertension   Relevant Medications   rivaroxaban  (XARELTO ) 20 MG TABS tablet   Other Relevant Orders   CBC   BMP8+EGFR   Aortic valve sclerosis   Relevant Medications   rivaroxaban  (XARELTO ) 20 MG TABS tablet   Other Relevant Orders   CBC     Other   Pure hypercholesterolemia   Relevant Medications   rivaroxaban  (XARELTO ) 20 MG TABS tablet   Other Visit Diagnoses       Chronic anticoagulation       Relevant Orders   CBC       Assessment & Plan Pulmonary embolism Currently managed with Xarelto  20 mg daily after previous treatment with Eliquis  was ineffective, resulting in a clot and pulmonary embolism. - Continue Xarelto  20 mg daily. - Order CBC and basic metabolic panel to assess hemoglobin, platelets, and kidney function. - Arrange for 90-day supply of Xarelto  for convenience.  Adverse effect of Xarelto  Reports hair loss and nail changes, suspected to be adverse effects of Xarelto . Symptoms include alopecia and onychodystrophy, with no improvement since starting Xarelto . Believes he is experiencing a rare side effect, possibly affecting less than 1% of users. - Consider topical vitamin E oil for cuticles.  Hyperlipidemia Well-managed on simvastatin  40 mg daily. - Continue  simvastatin  40 mg daily.  Thinning Abnormal Nail changes  Thought to be 2/2 to Xarelto ,  On Biotin as recommended by dermatology      Return in about 6 months (around 04/04/2024).         Mimi Alt, MD  [facility] [phone] (phone) [phone] (fax)  [facility]

## 2023-10-04 ENCOUNTER — Encounter (INDEPENDENT_AMBULATORY_CARE_PROVIDER_SITE_OTHER): Payer: Self-pay

## 2023-10-04 LAB — CBC
Hematocrit: 40.6 % (ref 37.5–51.0)
Hemoglobin: 12.8 g/dL — ABNORMAL LOW (ref 13.0–17.7)
MCH: 32.2 pg (ref 26.6–33.0)
MCHC: 31.5 g/dL (ref 31.5–35.7)
MCV: 102 fL — ABNORMAL HIGH (ref 79–97)
Platelets: 176 10*3/uL (ref 150–450)
RBC: 3.98 x10E6/uL — ABNORMAL LOW (ref 4.14–5.80)
RDW: 13.2 % (ref 11.6–15.4)
WBC: 8.1 10*3/uL (ref 3.4–10.8)

## 2023-10-04 LAB — BMP8+EGFR
BUN/Creatinine Ratio: 13 (ref 10–24)
BUN: 21 mg/dL (ref 10–36)
CO2: 21 mmol/L (ref 20–29)
Calcium: 9.4 mg/dL (ref 8.6–10.2)
Chloride: 104 mmol/L (ref 96–106)
Creatinine, Ser: 1.58 mg/dL — ABNORMAL HIGH (ref 0.76–1.27)
Glucose: 86 mg/dL (ref 70–99)
Potassium: 4.5 mmol/L (ref 3.5–5.2)
Sodium: 140 mmol/L (ref 134–144)
eGFR: 40 mL/min/{1.73_m2} — ABNORMAL LOW (ref >59)

## 2023-10-05 ENCOUNTER — Ambulatory Visit: Payer: Self-pay | Admitting: Family Medicine

## 2023-10-25 DIAGNOSIS — H01004 Unspecified blepharitis left upper eyelid: Secondary | ICD-10-CM | POA: Diagnosis not present

## 2023-12-02 ENCOUNTER — Other Ambulatory Visit: Payer: Self-pay | Admitting: Family Medicine

## 2023-12-22 DIAGNOSIS — E785 Hyperlipidemia, unspecified: Secondary | ICD-10-CM | POA: Diagnosis not present

## 2023-12-22 DIAGNOSIS — I499 Cardiac arrhythmia, unspecified: Secondary | ICD-10-CM | POA: Diagnosis not present

## 2023-12-22 DIAGNOSIS — R001 Bradycardia, unspecified: Secondary | ICD-10-CM | POA: Diagnosis not present

## 2023-12-22 DIAGNOSIS — I82593 Chronic embolism and thrombosis of other specified deep vein of lower extremity, bilateral: Secondary | ICD-10-CM | POA: Diagnosis not present

## 2023-12-22 DIAGNOSIS — I2699 Other pulmonary embolism without acute cor pulmonale: Secondary | ICD-10-CM | POA: Diagnosis not present

## 2023-12-22 DIAGNOSIS — Z950 Presence of cardiac pacemaker: Secondary | ICD-10-CM | POA: Diagnosis not present

## 2023-12-22 DIAGNOSIS — R0989 Other specified symptoms and signs involving the circulatory and respiratory systems: Secondary | ICD-10-CM | POA: Diagnosis not present

## 2023-12-22 DIAGNOSIS — I495 Sick sinus syndrome: Secondary | ICD-10-CM | POA: Diagnosis not present

## 2023-12-22 DIAGNOSIS — Z86718 Personal history of other venous thrombosis and embolism: Secondary | ICD-10-CM | POA: Diagnosis not present

## 2023-12-22 DIAGNOSIS — I10 Essential (primary) hypertension: Secondary | ICD-10-CM | POA: Diagnosis not present

## 2023-12-22 DIAGNOSIS — I358 Other nonrheumatic aortic valve disorders: Secondary | ICD-10-CM | POA: Diagnosis not present

## 2023-12-22 DIAGNOSIS — Z86711 Personal history of pulmonary embolism: Secondary | ICD-10-CM | POA: Diagnosis not present

## 2024-01-24 DIAGNOSIS — I495 Sick sinus syndrome: Secondary | ICD-10-CM | POA: Diagnosis not present

## 2024-02-15 DIAGNOSIS — L631 Alopecia universalis: Secondary | ICD-10-CM | POA: Diagnosis not present

## 2024-02-15 DIAGNOSIS — L570 Actinic keratosis: Secondary | ICD-10-CM | POA: Diagnosis not present

## 2024-02-15 DIAGNOSIS — Z08 Encounter for follow-up examination after completed treatment for malignant neoplasm: Secondary | ICD-10-CM | POA: Diagnosis not present

## 2024-02-15 DIAGNOSIS — Z85828 Personal history of other malignant neoplasm of skin: Secondary | ICD-10-CM | POA: Diagnosis not present

## 2024-02-22 ENCOUNTER — Telehealth: Payer: Self-pay

## 2024-02-22 DIAGNOSIS — M9901 Segmental and somatic dysfunction of cervical region: Secondary | ICD-10-CM | POA: Diagnosis not present

## 2024-02-22 DIAGNOSIS — M7918 Myalgia, other site: Secondary | ICD-10-CM | POA: Diagnosis not present

## 2024-02-22 DIAGNOSIS — M542 Cervicalgia: Secondary | ICD-10-CM | POA: Diagnosis not present

## 2024-02-22 DIAGNOSIS — M25511 Pain in right shoulder: Secondary | ICD-10-CM | POA: Diagnosis not present

## 2024-02-22 NOTE — Telephone Encounter (Signed)
 Copied from CRM [id number]. Topic: Clinical - Medication Prior Auth >> Feb 22, 2024  8:49 AM Gustabo D wrote: Pain in neck and needs a fax sent [phone] [facility]- they won't work on him without the permission of his pcp

## 2024-02-22 NOTE — Telephone Encounter (Signed)
 Called [facility], message is unclear as to what patient needs (acute care or rehab/ PT orders). Requested that they fax tis information to us  with what they need or call back.

## 2024-02-23 DIAGNOSIS — M7918 Myalgia, other site: Secondary | ICD-10-CM | POA: Diagnosis not present

## 2024-02-23 DIAGNOSIS — M542 Cervicalgia: Secondary | ICD-10-CM | POA: Diagnosis not present

## 2024-02-23 DIAGNOSIS — M9901 Segmental and somatic dysfunction of cervical region: Secondary | ICD-10-CM | POA: Diagnosis not present

## 2024-02-23 DIAGNOSIS — M5412 Radiculopathy, cervical region: Secondary | ICD-10-CM | POA: Diagnosis not present

## 2024-02-23 DIAGNOSIS — M25511 Pain in right shoulder: Secondary | ICD-10-CM | POA: Diagnosis not present

## 2024-04-04 ENCOUNTER — Encounter: Payer: Self-pay | Admitting: Family Medicine

## 2024-04-04 ENCOUNTER — Ambulatory Visit: Admitting: Family Medicine

## 2024-04-04 VITALS — BP 116/72 | HR 66 | Ht 66.0 in | Wt 150.7 lb

## 2024-04-04 DIAGNOSIS — R0989 Other specified symptoms and signs involving the circulatory and respiratory systems: Secondary | ICD-10-CM

## 2024-04-04 DIAGNOSIS — E782 Mixed hyperlipidemia: Secondary | ICD-10-CM

## 2024-04-04 DIAGNOSIS — I10 Essential (primary) hypertension: Secondary | ICD-10-CM | POA: Diagnosis not present

## 2024-04-04 DIAGNOSIS — I358 Other nonrheumatic aortic valve disorders: Secondary | ICD-10-CM

## 2024-04-04 DIAGNOSIS — Z86718 Personal history of other venous thrombosis and embolism: Secondary | ICD-10-CM | POA: Diagnosis not present

## 2024-04-04 DIAGNOSIS — L853 Xerosis cutis: Secondary | ICD-10-CM | POA: Diagnosis not present

## 2024-04-04 NOTE — Progress Notes (Signed)
 Established patient visit   Patient: Matthew Clayton   DOB: 1926/08/30   89 y.o. Male  MRN: [id number] Visit Date: 04/04/2024  Today's healthcare provider: Rockie Agent, MD   Chief Complaint  Patient presents with   Medical Management of Chronic Issues    Patient is present for follow up with pcp, reports no acute issues today and feeling well. Needing form signed of rinsurance   Subjective     HPI     Medical Management of Chronic Issues    Additional comments: Patient is present for follow up with pcp, reports no acute issues today and feeling well. Needing form signed of rinsurance      Last edited by Cherry Chiquita HERO, CMA on 04/04/2024  1:15 PM.       Discussed the use of AI scribe software for clinical note transcription with the patient, who gave verbal consent to proceed.  History of Present Illness Matthew Clayton is a 89 year old male who presents for a chronic follow-up.  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 He continues to take Xarelto  20 mg daily for these conditions. His left lower extremity remains swollen.  He has sick sinus syndrome and has a permanent pacemaker. He is doing well post-pacemaker insertion and continues to follow up with cardiology. His last cardiology visit was in August, and he has a follow-up scheduled for February.  He has hypercholesterolemia, for which he takes simvastatin  40 mg daily.  He has hypertension and is not on any blood pressure medications. He is not on any blood pressure medications.  He experiences hair loss and changes in his fingernails and toenails, which he attributes to Xarelto . He has tried collagen supplements without improvement.  He experiences chronic phlegm production, which occurs year-round and has persisted for several years. He has not identified any allergies and has not tried any over-the-counter antihistamines  for this issue.  He has dry skin, which he attributes to the dry heat in his home. He uses Jurgens lotion and CeraVe for moisturizing and prefers not to use Vaseline.  He has a history of cataracts but has 20/20 vision bilaterally with the use of glasses. He recently had his vision checked and received new glasses.  No history of nervous disorders, epilepsy, or significant neurological conditions. He is mentally alert and has not lost any limbs. He uses hearing aids.     Past Medical History:  Diagnosis Date   Arthritis    Years ago. Not acute.   Cataract    Both eyes. Fixed.   Cyst of knee joint, left    DVT (deep venous thrombosis) (HCC)    left leg   Hyperlipidemia     Medications: Outpatient Medications Prior to Visit  Medication Sig   Multiple Vitamin (MULTIVITAMIN PO) Take by mouth daily.   rivaroxaban  (XARELTO ) 20 MG TABS tablet Take 1 tablet (20 mg total) by mouth daily with supper. Continue 20 mg by mouth   simvastatin  (ZOCOR ) 40 MG tablet TAKE 1 TABLET BY MOUTH EVERY DAY   [DISCONTINUED] Biotin (BIOTIN 5000) 5 MG CAPS 5,000 mg.   No facility-administered medications prior to visit.    Review of Systems  Last CBC Lab Results  Component Value Date   WBC 8.1 10/03/2023   HGB 12.8 (L) 10/03/2023   HCT 40.6 10/03/2023   MCV 102 (H) 10/03/2023   MCH 32.2 10/03/2023  RDW 13.2 10/03/2023   PLT 176 10/03/2023   Last metabolic panel Lab Results  Component Value Date   GLUCOSE 86 10/03/2023   NA 140 10/03/2023   K 4.5 10/03/2023   CL 104 10/03/2023   CO2 21 10/03/2023   BUN 21 10/03/2023   CREATININE 1.58 (H) 10/03/2023   EGFR 40 (L) 10/03/2023   CALCIUM 9.4 10/03/2023   PROT 7.2 03/30/2022   ALBUMIN 4.3 03/30/2022   LABGLOB 2.9 03/30/2022   AGRATIO 1.5 03/30/2022   BILITOT 0.5 03/30/2022   ALKPHOS 72 03/30/2022   AST 18 03/30/2022   ALT 14 03/30/2022   ANIONGAP 8 04/30/2022   Last lipids Lab Results  Component Value Date   CHOL 136 03/30/2022    HDL 60 03/30/2022   LDLCALC 62 03/30/2022   TRIG 67 03/30/2022   CHOLHDL 2.3 03/30/2022   Last hemoglobin A1c No results found for: HGBA1C Last thyroid  functions Lab Results  Component Value Date   TSH 1.980 03/30/2022   FREET4 1.21 03/30/2022   Last vitamin D No results found for: 25OHVITD2, 25OHVITD3, VD25OH Last vitamin B12 and Folate Lab Results  Component Value Date   VITAMINB12 565 09/24/2021   FOLATE >20.0 09/24/2021        Objective    BP 116/72 (BP Location: Left Arm, Patient Position: Sitting, Cuff Size: Normal)   Pulse 66   Ht 5' 6 (1.676 m)   Wt 150 lb 11.2 oz (68.4 kg)   SpO2 98%   BMI 24.32 kg/m      Physical Exam  Physical Exam GENERAL: Well appearing, no acute distress CHEST: Normal respiratory effort, no wheezes or crackles CARDIOVASCULAR: Systolic murmur EXTREMITIES: 1+ pitting edema in lower extremity    No results found for any visits on 04/04/24.  Assessment & Plan     Problem List Items Addressed This Visit     Aortic valve sclerosis   Relevant Orders   Lipid panel   Essential hypertension - Primary   Relevant Orders   CMP14+EGFR   History of DVT (deep vein thrombosis)   Mixed hyperlipidemia   Relevant Orders   Lipid panel   Other Visit Diagnoses       Phlegm in throat         Dry skin           Assessment and Plan Assessment & Plan History of DVT/PE on chronic anticoagulation Chronic anticoagulation with Xarelto  20 mg daily for recurrent DVT and PE. Reports chronic lower extremity edema, likely related to previous DVT. - Continue Xarelto  20 mg daily - Continue follow-up with cardiology  Sick sinus syndrome, post permanent pacemaker Chronic Sick sinus syndrome managed with a permanent pacemaker. Reports doing well post-pacemaker placement. - Continue follow-up with cardiology  Aortic valve disorder with systolic murmur Aortic valve disorder with systolic murmur. No acute changes reported. - Continue  follow-up with cardiology  Essential hypertension, well controlled Chronic  Hypertension is well controlled with diet and exercise. No antihypertensive medications required. - Continue current management with diet and exercise  Mixed hyperlipidemia, well controlled Chronic  Hyperlipidemia is well controlled with simvastatin  40 mg daily. Reports hair loss and nail changes, possibly related to Xarelto  use. - Continue simvastatin  40 mg daily - Ordered lipid panel and CMP to monitor cholesterol and liver enzymes  Chronic lower extremity edema, post-DVT Chronic lower extremity edema, likely related to previous DVT. Reports persistent swelling in the affected leg. - Continue follow-up with cardiology  Chronic phlegm production Chronic  phlegm production, persistent throughout the year. No acute respiratory distress noted. - Recommended Allegra for phlegm management  Xerosis cutis (dry skin) Intermittent  Dry skin likely exacerbated by dry heat and low humidity. Uses Jergens and CeraVe lotions for moisturizing. Prefers to avoid Vaseline. - Recommended continued use of moisturizers like CeraVe - Suggested using a humidifier to manage indoor humidity  General Health Maintenance Vision is 20/20 bilaterally. No neurological or functional diseases affecting safe driving. Mentally alert and no limb loss reported. - Maintain current driving status     Return in about 1 year (around 04/04/2025) for Chronic F/U.         Rockie Agent, MD  [facility] [phone] (phone) [phone] (fax)  [facility]

## 2024-04-04 NOTE — Patient Instructions (Signed)
  Try Allegra once daily to help with the phelgm production    To keep you healthy, please keep in mind the following health maintenance items that you are due for:   Health Maintenance Due  Topic Date Due   Zoster Vaccines- Shingrix (1 of 2) 12/13/1945   DTaP/Tdap/Td (2 - Tdap) 09/16/2022     Best Wishes,   Dr. Lang

## 2024-04-05 LAB — LIPID PANEL
Chol/HDL Ratio: 2.3 ratio (ref 0.0–5.0)
Cholesterol, Total: 139 mg/dL (ref 100–199)
HDL: 61 mg/dL (ref >39)
LDL Chol Calc (NIH): 61 mg/dL (ref 0–99)
Triglycerides: 91 mg/dL (ref 0–149)
VLDL Cholesterol Cal: 17 mg/dL (ref 5–40)

## 2024-04-05 LAB — CMP14+EGFR
ALT: 18 IU/L (ref 0–44)
AST: 24 IU/L (ref 0–40)
Albumin: 4.1 g/dL (ref 3.6–4.6)
Alkaline Phosphatase: 81 IU/L (ref 48–129)
BUN/Creatinine Ratio: 14 (ref 10–24)
BUN: 18 mg/dL (ref 10–36)
Bilirubin Total: 0.4 mg/dL (ref 0.0–1.2)
CO2: 24 mmol/L (ref 20–29)
Calcium: 9.9 mg/dL (ref 8.6–10.2)
Chloride: 102 mmol/L (ref 96–106)
Creatinine, Ser: 1.3 mg/dL — ABNORMAL HIGH (ref 0.76–1.27)
Globulin, Total: 3 g/dL (ref 1.5–4.5)
Glucose: 84 mg/dL (ref 70–99)
Potassium: 4.5 mmol/L (ref 3.5–5.2)
Sodium: 139 mmol/L (ref 134–144)
Total Protein: 7.1 g/dL (ref 6.0–8.5)
eGFR: 51 mL/min/1.73 — ABNORMAL LOW (ref >59)

## 2024-04-09 ENCOUNTER — Ambulatory Visit: Payer: Self-pay | Admitting: Family Medicine

## 2024-10-09 ENCOUNTER — Ambulatory Visit

## 2025-04-04 ENCOUNTER — Ambulatory Visit: Admitting: Family Medicine
